# Patient Record
Sex: Male | Born: 1955 | Race: White | Hispanic: No | Marital: Single | Smoking: Current some day smoker
Health system: Southern US, Community
[De-identification: ages and names within clinical notes are randomized; demographics above are authoritative.]

## PROBLEM LIST (undated history)

## (undated) DIAGNOSIS — I10 Essential (primary) hypertension: Secondary | ICD-10-CM

## (undated) DIAGNOSIS — J449 Chronic obstructive pulmonary disease, unspecified: Secondary | ICD-10-CM

## (undated) DIAGNOSIS — C3490 Malignant neoplasm of unspecified part of unspecified bronchus or lung: Secondary | ICD-10-CM

## (undated) HISTORY — PX: KNEE SURGERY: SHX244

---

## 2005-08-12 ENCOUNTER — Emergency Department: Payer: Self-pay | Admitting: Emergency Medicine

## 2005-08-12 ENCOUNTER — Other Ambulatory Visit: Payer: Self-pay

## 2006-01-09 ENCOUNTER — Emergency Department (HOSPITAL_COMMUNITY): Admission: EM | Admit: 2006-01-09 | Discharge: 2006-01-09 | Payer: Self-pay | Admitting: Emergency Medicine

## 2009-09-07 ENCOUNTER — Inpatient Hospital Stay: Payer: Self-pay | Admitting: Psychiatry

## 2009-09-14 ENCOUNTER — Ambulatory Visit: Payer: Self-pay | Admitting: Unknown Physician Specialty

## 2009-09-27 ENCOUNTER — Ambulatory Visit: Payer: Self-pay | Admitting: Unknown Physician Specialty

## 2009-10-27 ENCOUNTER — Emergency Department: Payer: Self-pay | Admitting: Emergency Medicine

## 2010-04-23 ENCOUNTER — Inpatient Hospital Stay: Payer: Self-pay | Admitting: Unknown Physician Specialty

## 2012-12-30 ENCOUNTER — Emergency Department: Payer: Self-pay | Admitting: Emergency Medicine

## 2012-12-30 LAB — URINALYSIS, COMPLETE
Bacteria: NONE SEEN
Blood: NEGATIVE
Glucose,UR: 50 mg/dL (ref 0–75)
Nitrite: NEGATIVE
Protein: NEGATIVE
Specific Gravity: 1.019 (ref 1.003–1.030)
WBC UR: <1 /HPF (ref 0–5)

## 2012-12-30 LAB — CBC WITH DIFFERENTIAL/PLATELET
Eosinophil #: 0 10*3/uL (ref 0.0–0.7)
Eosinophil %: 0.6 %
HGB: 15.5 g/dL (ref 13.0–18.0)
Lymphocyte #: 1.9 10*3/uL (ref 1.0–3.6)
MCHC: 33.4 g/dL (ref 32.0–36.0)
MCV: 94 fL (ref 80–100)
Monocyte %: 15.8 %
Neutrophil #: 3.9 10*3/uL (ref 1.4–6.5)
Neutrophil %: 55.6 %
Platelet: 177 10*3/uL (ref 150–440)
RBC: 4.97 10*6/uL (ref 4.40–5.90)
RDW: 12.9 % (ref 11.5–14.5)
WBC: 7.1 10*3/uL (ref 3.8–10.6)

## 2012-12-30 LAB — COMPREHENSIVE METABOLIC PANEL
Albumin: 3.5 g/dL (ref 3.4–5.0)
Alkaline Phosphatase: 75 U/L (ref 50–136)
Anion Gap: 9 (ref 7–16)
Bilirubin,Total: 0.6 mg/dL (ref 0.2–1.0)
Chloride: 102 mmol/L (ref 98–107)
Co2: 25 mmol/L (ref 21–32)
Glucose: 94 mg/dL (ref 65–99)
Potassium: 3.8 mmol/L (ref 3.5–5.1)
SGOT(AST): 123 U/L — ABNORMAL HIGH (ref 15–37)
SGPT (ALT): 129 U/L — ABNORMAL HIGH (ref 12–78)

## 2013-03-17 ENCOUNTER — Encounter (HOSPITAL_COMMUNITY)
Admission: EM | Disposition: A | Discharge status: Home / Self Care | Payer: Self-pay | Source: Ambulatory Visit | Attending: Orthopedic Surgery

## 2013-03-17 ENCOUNTER — Inpatient Hospital Stay (HOSPITAL_COMMUNITY): Payer: Self-pay | Admitting: Anesthesiology

## 2013-03-17 ENCOUNTER — Inpatient Hospital Stay (HOSPITAL_COMMUNITY)
Admission: EM | Admit: 2013-03-17 | Discharge: 2013-03-19 | DRG: 572 | Disposition: A | Discharge status: Home / Self Care | Payer: MEDICAID | Source: Ambulatory Visit | Attending: Orthopedic Surgery | Admitting: Orthopedic Surgery

## 2013-03-17 ENCOUNTER — Emergency Department: Payer: Self-pay

## 2013-03-17 ENCOUNTER — Encounter (HOSPITAL_COMMUNITY): Payer: Self-pay | Admitting: Anesthesiology

## 2013-03-17 DIAGNOSIS — B1920 Unspecified viral hepatitis C without hepatic coma: Secondary | ICD-10-CM | POA: Diagnosis present

## 2013-03-17 DIAGNOSIS — L02519 Cutaneous abscess of unspecified hand: Principal | ICD-10-CM | POA: Diagnosis present

## 2013-03-17 DIAGNOSIS — IMO0001 Reserved for inherently not codable concepts without codable children: Secondary | ICD-10-CM | POA: Diagnosis present

## 2013-03-17 HISTORY — PX: I & D EXTREMITY: SHX5045

## 2013-03-17 LAB — CBC WITH DIFFERENTIAL/PLATELET
Eosinophil #: 0.1 10*3/uL (ref 0.0–0.7)
Eosinophil %: 1.6 %
HCT: 49.1 % (ref 40.0–52.0)
HGB: 17.7 g/dL (ref 13.0–18.0)
MCH: 32.7 pg (ref 26.0–34.0)
MCHC: 36.1 g/dL — ABNORMAL HIGH (ref 32.0–36.0)
Neutrophil %: 66 %
Platelet: 227 10*3/uL (ref 150–440)
RDW: 12.6 % (ref 11.5–14.5)

## 2013-03-17 LAB — COMPREHENSIVE METABOLIC PANEL
Alkaline Phosphatase: 73 U/L (ref 50–136)
Anion Gap: 5 — ABNORMAL LOW (ref 7–16)
BUN: 11 mg/dL (ref 7–18)
Bilirubin,Total: 0.6 mg/dL (ref 0.2–1.0)
Calcium, Total: 8.5 mg/dL (ref 8.5–10.1)
Chloride: 108 mmol/L — ABNORMAL HIGH (ref 98–107)
Co2: 28 mmol/L (ref 21–32)
Creatinine: 0.72 mg/dL (ref 0.60–1.30)
Potassium: 3.4 mmol/L — ABNORMAL LOW (ref 3.5–5.1)
SGOT(AST): 58 U/L — ABNORMAL HIGH (ref 15–37)

## 2013-03-17 SURGERY — IRRIGATION AND DEBRIDEMENT EXTREMITY
Anesthesia: General | Laterality: Right | Wound class: Dirty or Infected

## 2013-03-17 MED ORDER — PROPOFOL 10 MG/ML IV BOLUS
INTRAVENOUS | Status: DC | PRN
  Administered 2013-03-17: 120 mg via INTRAVENOUS

## 2013-03-17 MED ORDER — FENTANYL CITRATE 0.05 MG/ML IJ SOLN
INTRAMUSCULAR | Status: DC | PRN
  Administered 2013-03-17: 100 ug via INTRAVENOUS
  Administered 2013-03-17: 50 ug via INTRAVENOUS
  Administered 2013-03-17: 100 ug via INTRAVENOUS

## 2013-03-17 MED ORDER — SODIUM CHLORIDE 0.9 % IR SOLN
Status: DC | PRN
  Administered 2013-03-17: 3000 mL

## 2013-03-17 MED ORDER — MIDAZOLAM HCL 5 MG/5ML IJ SOLN
INTRAMUSCULAR | Status: DC | PRN
  Administered 2013-03-17: 2 mg via INTRAVENOUS

## 2013-03-17 MED ORDER — LIDOCAINE HCL (CARDIAC) 20 MG/ML IV SOLN
INTRAVENOUS | Status: DC | PRN
  Administered 2013-03-17: 100 mg via INTRAVENOUS

## 2013-03-17 MED ORDER — ONDANSETRON HCL 4 MG/2ML IJ SOLN
INTRAMUSCULAR | Status: DC | PRN
  Administered 2013-03-17: 4 mg via INTRAVENOUS

## 2013-03-17 MED ORDER — LACTATED RINGERS IV SOLN
INTRAVENOUS | Status: DC | PRN
  Administered 2013-03-17: 23:00:00 via INTRAVENOUS

## 2013-03-17 MED ORDER — SUCCINYLCHOLINE CHLORIDE 20 MG/ML IJ SOLN
INTRAMUSCULAR | Status: DC | PRN
  Administered 2013-03-17: 120 mg via INTRAVENOUS

## 2013-03-17 SURGICAL SUPPLY — 58 items
BANDAGE CONFORM 2  STR LF (GAUZE/BANDAGES/DRESSINGS) IMPLANT
BANDAGE ELASTIC 3 VELCRO ST LF (GAUZE/BANDAGES/DRESSINGS) ×2 IMPLANT
BANDAGE ELASTIC 4 VELCRO ST LF (GAUZE/BANDAGES/DRESSINGS) ×1 IMPLANT
BANDAGE GAUZE ELAST BULKY 4 IN (GAUZE/BANDAGES/DRESSINGS) ×1 IMPLANT
BNDG CMPR 9X4 STRL LF SNTH (GAUZE/BANDAGES/DRESSINGS)
BNDG COHESIVE 1X5 TAN STRL LF (GAUZE/BANDAGES/DRESSINGS) IMPLANT
BNDG ESMARK 4X9 LF (GAUZE/BANDAGES/DRESSINGS) ×1 IMPLANT
CLOTH BEACON ORANGE TIMEOUT ST (SAFETY) ×2 IMPLANT
CORDS BIPOLAR (ELECTRODE) ×2 IMPLANT
COVER SURGICAL LIGHT HANDLE (MISCELLANEOUS) ×2 IMPLANT
CUFF TOURNIQUET SINGLE 18IN (TOURNIQUET CUFF) ×2 IMPLANT
CUFF TOURNIQUET SINGLE 24IN (TOURNIQUET CUFF) IMPLANT
DRAIN PENROSE 1/4X12 LTX STRL (WOUND CARE) IMPLANT
DRAPE SURG 17X23 STRL (DRAPES) ×2 IMPLANT
DRSG ADAPTIC 3X8 NADH LF (GAUZE/BANDAGES/DRESSINGS) ×1 IMPLANT
ELECT REM PT RETURN 9FT ADLT (ELECTROSURGICAL)
ELECTRODE REM PT RTRN 9FT ADLT (ELECTROSURGICAL) IMPLANT
GAUZE XEROFORM 1X8 LF (GAUZE/BANDAGES/DRESSINGS) ×1 IMPLANT
GAUZE XEROFORM 5X9 LF (GAUZE/BANDAGES/DRESSINGS) IMPLANT
GLOVE BIOGEL PI IND STRL 8.5 (GLOVE) ×1 IMPLANT
GLOVE BIOGEL PI INDICATOR 8.5 (GLOVE) ×1
GLOVE SURG ORTHO 8.0 STRL STRW (GLOVE) ×2 IMPLANT
GOWN PREVENTION PLUS XLARGE (GOWN DISPOSABLE) ×3 IMPLANT
GOWN STRL NON-REIN LRG LVL3 (GOWN DISPOSABLE) ×3 IMPLANT
HANDPIECE INTERPULSE COAX TIP (DISPOSABLE) ×2
KIT BASIN OR (CUSTOM PROCEDURE TRAY) ×2 IMPLANT
KIT ROOM TURNOVER OR (KITS) ×2 IMPLANT
LOOP VESSEL MAXI BLUE (MISCELLANEOUS) ×1 IMPLANT
MANIFOLD NEPTUNE II (INSTRUMENTS) ×2 IMPLANT
NDL HYPO 25GX1X1/2 BEV (NEEDLE) IMPLANT
NEEDLE HYPO 25GX1X1/2 BEV (NEEDLE) IMPLANT
NS IRRIG 1000ML POUR BTL (IV SOLUTION) ×2 IMPLANT
PACK ORTHO EXTREMITY (CUSTOM PROCEDURE TRAY) ×2 IMPLANT
PAD ARMBOARD 7.5X6 YLW CONV (MISCELLANEOUS) ×4 IMPLANT
PAD CAST 4YDX4 CTTN HI CHSV (CAST SUPPLIES) ×1 IMPLANT
PADDING CAST ABS 4INX4YD NS (CAST SUPPLIES) ×1
PADDING CAST ABS COTTON 4X4 ST (CAST SUPPLIES) IMPLANT
PADDING CAST COTTON 4X4 STRL (CAST SUPPLIES)
SET CYSTO W/LG BORE CLAMP LF (SET/KITS/TRAYS/PACK) ×1 IMPLANT
SET HNDPC FAN SPRY TIP SCT (DISPOSABLE) IMPLANT
SOAP 2 % CHG 4 OZ (WOUND CARE) ×2 IMPLANT
SPONGE GAUZE 4X4 12PLY (GAUZE/BANDAGES/DRESSINGS) ×1 IMPLANT
SPONGE LAP 18X18 X RAY DECT (DISPOSABLE) ×1 IMPLANT
SPONGE LAP 4X18 X RAY DECT (DISPOSABLE) ×1 IMPLANT
SUCTION FRAZIER TIP 10 FR DISP (SUCTIONS) ×2 IMPLANT
SUT ETHIBOND 4 0 TF (SUTURE) ×1 IMPLANT
SUT ETHILON 4 0 PS 2 18 (SUTURE) IMPLANT
SUT ETHILON 5 0 P 3 18 (SUTURE) ×1
SUT NYLON ETHILON 5-0 P-3 1X18 (SUTURE) ×1 IMPLANT
SUT PROLENE 4 0 PS 2 18 (SUTURE) ×1 IMPLANT
SYR CONTROL 10ML LL (SYRINGE) IMPLANT
TOWEL OR 17X24 6PK STRL BLUE (TOWEL DISPOSABLE) ×2 IMPLANT
TOWEL OR 17X26 10 PK STRL BLUE (TOWEL DISPOSABLE) ×2 IMPLANT
TUBE ANAEROBIC SPECIMEN COL (MISCELLANEOUS) ×1 IMPLANT
TUBE CONNECTING 12X1/4 (SUCTIONS) ×2 IMPLANT
UNDERPAD 30X30 INCONTINENT (UNDERPADS AND DIAPERS) ×2 IMPLANT
WATER STERILE IRR 1000ML POUR (IV SOLUTION) ×2 IMPLANT
YANKAUER SUCT BULB TIP NO VENT (SUCTIONS) ×2 IMPLANT

## 2013-03-17 NOTE — Preoperative (Signed)
Beta Blockers   Reason not to administer Beta Blockers:Not Applicable 

## 2013-03-17 NOTE — Brief Op Note (Signed)
03/17/2013  11:52 PM  PATIENT:  Dennis Perkins  57 y.o. male  PRE-OPERATIVE DIAGNOSIS:  Hand Abscess  POST-OPERATIVE DIAGNOSIS:  Hand Abscess  PROCEDURE:  Procedure(s): IRRIGATION AND DEBRIDEMENT EXTREMITY (Right)  SURGEON:  Surgeon(s) and Role:    * Sharma Covert, MD - Primary  PHYSICIAN ASSISTANT:   ASSISTANTS: none   ANESTHESIA:   general  EBL:  Total I/O In: 750 [I.V.:750] Out: 15 [Blood:15]  BLOOD ADMINISTERED:none  DRAINS: none   LOCAL MEDICATIONS USED:  NONE  SPECIMEN:  No Specimen  DISPOSITION OF SPECIMEN:  N/A  COUNTS:  YES  TOURNIQUET:   Total Tourniquet Time Documented: Upper Arm (Right) - 20 minutes Total: Upper Arm (Right) - 20 minutes   DICTATION: .Other Dictation: Dictation Number [id number]  PLAN OF CARE: Admit to inpatient   PATIENT DISPOSITION:  PACU - hemodynamically stable.   Delay start of Pharmacological VTE agent (>24hrs) due to surgical blood loss or risk of bleeding: not applicable

## 2013-03-17 NOTE — H&P (Signed)
Dennis Perkins is an 57 y.o. male.   Chief Complaint: RIGHT HAND INFECTION HPI: PT TRANSFERRED FROM [place] WITH RIGHT HAND INFECTION PT SUSTAINED CAT SCRATCH DORSUM OF RIGHT HAND AND NOW AFTER SEVERAL DAYS HAS BEEN DRAINING PUS PT SEEN/EVALUATED AT [facility] AND TRANSFERRED HERE FOR DEFINITIVE CARE  HEP C +   No past surgical history on file. LEFT HAND SURGERY/RIGHT KNEE SURGERY  No family history on file. NO REACTION TO ANESTHESIA Social History:  has no tobacco, alcohol, and drug history on file.NONSMOKER, OCC ETOH H/O OF IV DRUG USE  Allergies: Allergies not on file  No prescriptions prior to admission    No results found for this or any previous visit (from the past 48 hour(s)). No results found.  NO RECENT ILLNESSES OR HOSPITALIZATIONS  There were no vitals taken for this visit. General Appearance:  Alert, cooperative, no distress, appears stated age  Head:  Normocephalic, without obvious abnormality, atraumatic  Eyes:  Pupils equal, conjunctiva/corneas clear,         Throat: Lips, mucosa, and tongue normal; teeth and gums normal  Neck: No visible masses     Lungs:   respirations unlabored  Chest Wall:  No tenderness or deformity  Heart:  Regular rate and rhythm,  Abdomen:   Soft, non-tender,         Extremities: RIGHT HAND: DRAINING OVER WOUND OVER DORSUM OF LONG FINGER. +PURULENCE FINGERS WARM WELL PERFUSED LIMITED DIGITAL MOTION   Pulses: 2+ and symmetric  Skin: Skin color, texture, turgor normal, no rashes or lesions     Neurologic: Normal    Assessment/Plan RIGHT HAND DORSAL ABSCESS CONCERN FOR MCP JOINT INFECTION FROM CAT BITE  RIGHT HAND INCISION AND DRAINAGE AND JOINT DEBRIDEMENT  R/B/A DISCUSSED WITH PT IN HOLDING AREA.  PT VOICED UNDERSTANDING OF PLAN CONSENT SIGNED DAY OF SURGERY PT SEEN AND EXAMINED PRIOR TO OPERATIVE PROCEDURE/DAY OF SURGERY SITE MARKED. QUESTIONS ANSWERED WILL REMAIN AN INPATIENT FOLLOWING SURGERY  Sharma Covert 03/17/2013, 10:31 PM

## 2013-03-17 NOTE — Anesthesia Procedure Notes (Signed)
Procedure Name: Intubation Date/Time: 03/17/2013 11:10 PM Performed by: Lugene Beougher S Pre-anesthesia Checklist: Patient identified, Timeout performed, Emergency Drugs available, Suction available and Patient being monitored Patient Re-evaluated:Patient Re-evaluated prior to inductionOxygen Delivery Method: Circle system utilized Preoxygenation: Pre-oxygenation with 100% oxygen Intubation Type: IV induction and Rapid sequence Ventilation: Mask ventilation without difficulty Laryngoscope Size: Mac and 4 Grade View: Grade I Tube type: Oral Tube size: 7.5 mm Number of attempts: 1 Airway Equipment and Method: Stylet Placement Confirmation: ETT inserted through vocal cords under direct vision,  positive ETCO2 and breath sounds checked- equal and bilateral Secured at: 22 cm Tube secured with: Tape Dental Injury: Teeth and Oropharynx as per pre-operative assessment

## 2013-03-17 NOTE — Anesthesia Preprocedure Evaluation (Signed)
Anesthesia Evaluation  Patient identified by MRN, date of birth, ID band Patient awake    Reviewed: Allergy & Precautions, H&P , NPO status , Patient's Chart, lab work & pertinent test results  Airway Mallampati: I TM Distance: >3 FB Neck ROM: Full    Dental   Pulmonary          Cardiovascular     Neuro/Psych    GI/Hepatic   Endo/Other    Renal/GU      Musculoskeletal   Abdominal   Peds  Hematology   Anesthesia Other Findings   Reproductive/Obstetrics                           Anesthesia Physical Anesthesia Plan  ASA: II and emergent  Anesthesia Plan: General   Post-op Pain Management:    Induction: Intravenous, Rapid sequence and Cricoid pressure planned  Airway Management Planned: Oral ETT  Additional Equipment:   Intra-op Plan:   Post-operative Plan: Extubation in OR  Informed Consent: I have reviewed the patients History and Physical, chart, labs and discussed the procedure including the risks, benefits and alternatives for the proposed anesthesia with the patient or authorized representative who has indicated his/her understanding and acceptance.     Plan Discussed with: CRNA and Surgeon  Anesthesia Plan Comments:         Anesthesia Quick Evaluation  

## 2013-03-18 ENCOUNTER — Encounter (HOSPITAL_COMMUNITY): Payer: Self-pay | Admitting: *Deleted

## 2013-03-18 MED ORDER — METHOCARBAMOL 500 MG PO TABS
ORAL_TABLET | ORAL | Status: AC
  Filled 2013-03-18: qty 1

## 2013-03-18 MED ORDER — HYDROMORPHONE HCL PF 1 MG/ML IJ SOLN
0.2500 mg | INTRAMUSCULAR | Status: DC | PRN
  Administered 2013-03-18 (×4): 0.5 mg via INTRAVENOUS

## 2013-03-18 MED ORDER — OXYCODONE HCL 5 MG PO TABS: 5.0000 mg | ORAL_TABLET | Freq: Once | ORAL | Status: DC | PRN

## 2013-03-18 MED ORDER — DIPHENHYDRAMINE HCL 25 MG PO CAPS: 25.0000 mg | ORAL_CAPSULE | Freq: Four times a day (QID) | ORAL | Status: DC | PRN

## 2013-03-18 MED ORDER — LABETALOL HCL 5 MG/ML IV SOLN
5.0000 mg | Freq: Once | INTRAVENOUS | Status: AC
  Administered 2013-03-18: 5 mg via INTRAVENOUS

## 2013-03-18 MED ORDER — HYDROMORPHONE HCL PF 1 MG/ML IJ SOLN
INTRAMUSCULAR | Status: AC
  Filled 2013-03-18: qty 1

## 2013-03-18 MED ORDER — MEPERIDINE HCL 25 MG/ML IJ SOLN: 6.2500 mg | INTRAMUSCULAR | Status: DC | PRN

## 2013-03-18 MED ORDER — HYDROMORPHONE HCL PF 1 MG/ML IJ SOLN
0.5000 mg | INTRAMUSCULAR | Status: DC | PRN
  Administered 2013-03-18 – 2013-03-19 (×5): 1 mg via INTRAVENOUS
  Filled 2013-03-18 (×5): qty 1

## 2013-03-18 MED ORDER — OXYCODONE-ACETAMINOPHEN 5-325 MG PO TABS
1.0000 | ORAL_TABLET | ORAL | Status: DC | PRN
  Administered 2013-03-18 (×2): 2 via ORAL
  Filled 2013-03-18 (×2): qty 2

## 2013-03-18 MED ORDER — METHOCARBAMOL 100 MG/ML IJ SOLN: 500.0000 mg | Freq: Four times a day (QID) | INTRAVENOUS | Status: DC | PRN

## 2013-03-18 MED ORDER — METHOCARBAMOL 500 MG PO TABS
500.0000 mg | ORAL_TABLET | Freq: Four times a day (QID) | ORAL | Status: DC | PRN
  Administered 2013-03-18: 500 mg via ORAL
  Filled 2013-03-18: qty 1

## 2013-03-18 MED ORDER — KCL IN DEXTROSE-NACL 40-5-0.45 MEQ/L-%-% IV SOLN
INTRAVENOUS | Status: DC
  Administered 2013-03-18: 02:00:00 via INTRAVENOUS
  Filled 2013-03-18 (×3): qty 1000

## 2013-03-18 MED ORDER — ALPRAZOLAM 0.5 MG PO TABS: 0.5000 mg | ORAL_TABLET | Freq: Four times a day (QID) | ORAL | Status: DC | PRN

## 2013-03-18 MED ORDER — LABETALOL HCL 5 MG/ML IV SOLN
INTRAVENOUS | Status: AC
  Filled 2013-03-18: qty 4

## 2013-03-18 MED ORDER — ONDANSETRON HCL 4 MG/2ML IJ SOLN: 4.0000 mg | Freq: Four times a day (QID) | INTRAMUSCULAR | Status: DC | PRN

## 2013-03-18 MED ORDER — VITAMIN C 500 MG PO TABS
1000.0000 mg | ORAL_TABLET | Freq: Every day | ORAL | Status: DC
  Administered 2013-03-18 – 2013-03-19 (×2): 1000 mg via ORAL
  Filled 2013-03-18 (×2): qty 2

## 2013-03-18 MED ORDER — OXYCODONE HCL 5 MG/5ML PO SOLN: 5.0000 mg | Freq: Once | ORAL | Status: DC | PRN

## 2013-03-18 MED ORDER — ADULT MULTIVITAMIN W/MINERALS CH
1.0000 | ORAL_TABLET | Freq: Every day | ORAL | Status: DC
  Administered 2013-03-18 – 2013-03-19 (×2): 1 via ORAL
  Filled 2013-03-18 (×2): qty 1

## 2013-03-18 MED ORDER — ONDANSETRON HCL 4 MG PO TABS: 4.0000 mg | ORAL_TABLET | Freq: Four times a day (QID) | ORAL | Status: DC | PRN

## 2013-03-18 MED ORDER — HYDROCODONE-ACETAMINOPHEN 5-325 MG PO TABS
1.0000 | ORAL_TABLET | ORAL | Status: DC | PRN
  Administered 2013-03-18 – 2013-03-19 (×4): 2 via ORAL
  Filled 2013-03-18 (×4): qty 2

## 2013-03-18 MED ORDER — ZOLPIDEM TARTRATE 5 MG PO TABS: 5.0000 mg | ORAL_TABLET | Freq: Every evening | ORAL | Status: DC | PRN

## 2013-03-18 MED ORDER — ONDANSETRON HCL 4 MG/2ML IJ SOLN: 4.0000 mg | Freq: Once | INTRAMUSCULAR | Status: DC | PRN

## 2013-03-18 MED ORDER — SODIUM CHLORIDE 0.9 % IV SOLN
3.0000 g | Freq: Four times a day (QID) | INTRAVENOUS | Status: DC
  Administered 2013-03-18 – 2013-03-19 (×6): 3 g via INTRAVENOUS
  Filled 2013-03-18 (×8): qty 3

## 2013-03-18 MED ORDER — DOCUSATE SODIUM 100 MG PO CAPS
100.0000 mg | ORAL_CAPSULE | Freq: Two times a day (BID) | ORAL | Status: DC
  Administered 2013-03-18 – 2013-03-19 (×3): 100 mg via ORAL
  Filled 2013-03-18 (×5): qty 1

## 2013-03-18 NOTE — Anesthesia Postprocedure Evaluation (Signed)
Anesthesia Post Note  Patient: Dennis Perkins  Procedure(s) Performed: Procedure(s) (LRB): IRRIGATION AND DEBRIDEMENT EXTREMITY, flexor tendon repair (Right)  Anesthesia type: general  Patient location: PACU  Post pain: Pain level controlled  Post assessment: Patient's Cardiovascular Status Stable  Last Vitals:  Filed Vitals:   03/18/13 0015  BP: 156/116  Pulse: 101  Temp:   Resp: 15    Post vital signs: Reviewed and stable  Level of consciousness: sedated  Complications: No apparent anesthesia complications

## 2013-03-18 NOTE — Progress Notes (Signed)
KACE HARTJE [id number] Code Status: full   Admission Data: 03/18/2013 2:16 AM Attending Provider:  Melvyn Novas  WUJ:[id number], Provider, MD Consults/ Treatment Team:    Dennis Perkins is a 57 y.o. male patient admitted from ED awake, alert - oriented  X 3 - no acute distress noted.  VSS - Blood pressure 165/113, pulse 90, temperature 98.4 F (36.9 C), temperature source Oral, resp. rate 16, height 5\' 9"  (1.753 m), weight 65.5 kg (144 lb 6.4 oz), SpO2 94.00%.  no c/o shortness of breath, no c/o chest pain.   IV Fluids:  IV in place, occlusive dsg intact without redness, IV cath hand left, condition patent and no redness D5W/0.45 NaCl.  Allergies:  No Known Allergies   History reviewed. No pertinent past medical history. No prescriptions prior to admission   History:  obtained from the patient. Tobacco/alcohol: denied social drinker  Orientation to room, and floor completed with information packet given to patient/family.  Patient declined safety video at this time.  Admission INP armband ID verified with patient/family, and in place.   SR up x 2, fall assessment complete, with patient and family able to verbalize understanding of risk associated with falls, and verbalized understanding to call nsg before up out of bed.  Call light within reach, patient able to voice, and demonstrate understanding.  Skin, clean-dry- intact without evidence of bruising, or skin tears.   No evidence of skin break down noted on exam.     Will cont to eval and treat per MD orders.  Orvan Seen, RN 03/18/2013 2:16 AM

## 2013-03-18 NOTE — Op Note (Signed)
NAMEMCGREGOR, TINNON NO.:  [id number]  MEDICAL RECORD NO.:  [id number]  LOCATION:  5532                         FACILITY:  [facility]  PHYSICIAN:  Madelynn Done, MD  DATE OF BIRTH:  1956-04-05  DATE OF PROCEDURE: DATE OF DISCHARGE:                              OPERATIVE REPORT   PREOPERATIVE DIAGNOSES:  Right hand abscess, dorsal metacarpophalangeal joint.  POSTOPERATIVE DIAGNOSIS:  Right hand abscess, dorsal metacarpophalangeal joint.  ATTENDING PHYSICIAN:  Madelynn Done, MD, who scrubbed and present for the entire procedure.  ASSISTANT SURGEON:  None.  ANESTHESIA:  General via LMA.  SURGICAL PROCEDURE: 1. Right long finger metacarpophalangeal joint arthrotomy and     drainage. 2. Right long finger extensor tendon repair 3. Right long finger excisional debridement of skin, subcutaneous     tissue, and tendon.  SURGICAL INDICATIONS:  Mr. Cranshaw is a 57 year old gentleman who sustained a cat scratch several days ago.  The patient presented to the [facility] Emergency Department with worsening swelling and pain to the hand. The patient is transferred to [facility] for definitive care.  Risks, benefits, and alternatives were discussed in detail with the patient and signed informed consent was obtained.  Risks include, but not limited to bleeding, infection, damage to nearby nerves, arteries, or tendons, loss of motion of wrist and digits, incomplete relief of symptoms, tendon rupture, and need for further surgical intervention.  DESCRIPTION OF PROCEDURE:  The patient was appropriately identified, brought to the preop holding area, marked with a permanent marker made on the right hand to indicate the correct operative site.  The patient was brought back to the operating room, placed supine on the anesthesia room table.  General anesthesia was administered.  The patient tolerated this well.  Well-padded tourniquet was then placed on the right brachium and  sealed with 1000-drape.  Right upper extremity was then prepped and draped in normal sterile fashion.  Time-out was called, correct side was identified and the procedure was then begun.  Attention was then turned to the right hand where long incision sites were made directly over the MCP joint, long finger.  Dissection was then carried down through the skin, subcutaneous tissue, and tourniquet insufflated.  Excisional debridement of skin, subcutaneous tissue, and a portion extensor tendon was then carried out.  The patient did have a mild amount of purulence and it was extended all the way into the joint capsule.  Completion of the joint arthrotomy was then carried out.  The joint was then thoroughly irrigated.  The devitalized tissue and infected tendon was then also debrided back.  The patient did have infection going all the way through the extensor tendon.  This was carefully elevated off the MCP joint capsule, both proximally and distally removing the devitalized necrotic looking tendon.  The wound was then thoroughly irrigated.  The joint was then irrigated with 3 L of saline.  Following the joint arthrotomy and excisional debridement, the extensor tendon was then repaired with 4-0 Ethibond figure-of-eight horizontal mattress sutures back to healthy tendon.  Copious wound irrigation done throughout.  The wound was then loosely closed and reapproximated with small blue vessel loops with Prolene suture.  Adaptic dressing, sterile compressive bandage then applied.  The patient tolerated the procedure well and returned to recovery room in good condition after being placed in a volar splint.  POSTPROCEDURE PLAN:  The patient was admitted overnight for IV antibiotics, pain control, discharge.  Wound check within 48 hours, and drain removal and likely discharge in the splint, splint immobilization likely for the first 4 weeks, to protect the tendon repair, oral antibiotics.  Continue to  follow him closely.     Madelynn Done, MD     FWO/MEDQ  D:  03/17/2013  T:  03/18/2013  Job:  [id number]

## 2013-03-18 NOTE — Transfer of Care (Signed)
Immediate Anesthesia Transfer of Care Note  Patient: Dennis Perkins  Procedure(s) Performed: Procedure(s): IRRIGATION AND DEBRIDEMENT EXTREMITY, flexor tendon repair (Right)  Patient Location: PACU  Anesthesia Type:General  Level of Consciousness: awake, alert  and oriented  Airway & Oxygen Therapy: Patient Spontanous Breathing and Patient connected to nasal cannula oxygen  Post-op Assessment: Report given to PACU RN and Post -op Vital signs reviewed and stable  Post vital signs: Reviewed and stable  Complications: No apparent anesthesia complications

## 2013-03-18 NOTE — Progress Notes (Signed)
UR COMPLETED  

## 2013-03-18 NOTE — Progress Notes (Signed)
Pt.s BP remains 150s/100s, Dr. Michelle Piper aware and states give labetalol and ok to take to room

## 2013-03-18 NOTE — Care Management Note (Signed)
    Page 1 of 1   03/19/2013     11:08:58 AM   CARE MANAGEMENT NOTE 03/19/2013  Patient:  Dennis Perkins, Dennis Perkins   Account Number:  [id number]  Date Initiated:  03/18/2013  Documentation initiated by:  Letha Cape  Subjective/Objective Assessment:   dx right hand cat scratch  admit- lives with family     Action/Plan:   Anticipated DC Date:  03/19/2013   Anticipated DC Plan:  HOME/SELF CARE      DC Planning Services  CM consult      Choice offered to / List presented to:             Status of service:  Completed, signed off Medicare Important Message given?   (If response is "NO", the following Medicare IM given date fields will be blank) Date Medicare IM given:   Date Additional Medicare IM given:    Discharge Disposition:  HOME/SELF CARE  Per UR Regulation:  Reviewed for med. necessity/level of care/duration of stay  If discussed at Long Length of Stay Meetings, dates discussed:    Comments:  03/18/13 8:29 Letha Cape RN, BSN [phone] patient is indep. He can afford his medications and has transportation.  No needs anticipated.

## 2013-03-19 MED ORDER — OXYCODONE-ACETAMINOPHEN 10-325 MG PO TABS: 1.0000 | ORAL_TABLET | ORAL | Status: DC | PRN

## 2013-03-19 MED ORDER — DOCUSATE SODIUM 100 MG PO CAPS: 100.0000 mg | ORAL_CAPSULE | Freq: Two times a day (BID) | ORAL | Status: DC

## 2013-03-19 MED ORDER — AMOXICILLIN-POT CLAVULANATE 875-125 MG PO TABS: 1.0000 | ORAL_TABLET | Freq: Two times a day (BID) | ORAL | Status: DC

## 2013-03-19 NOTE — Discharge Summary (Signed)
Physician Discharge Summary  Patient ID: Dennis Perkins MRN: [id number] DOB/AGE: Jun 29, 1956 57 y.o.  Admit date: 03/17/2013 Discharge date: 03/19/2013  Admission Diagnoses: Hand Abscess History reviewed. No pertinent past medical history.  Discharge Diagnoses:  sAME AS ADMISSION  Surgeries: Procedure(s): IRRIGATION AND DEBRIDEMENT EXTREMITY, flexor tendon repair on 03/17/2013 - 03/18/2013    Consultants:  NONE  Discharged Condition: Improved  Hospital Course: [name] is an 57 y.o. male who was admitted 03/17/2013 with a chief complaint of No chief complaint on file. , and found to have a diagnosis of Hand Abscess.  They were brought to the operating room on 03/17/2013 - 03/18/2013 and underwent Procedure(s): IRRIGATION AND DEBRIDEMENT EXTREMITY, flexor tendon repair.    They were given perioperative antibiotics: Anti-infectives   Start     Dose/Rate Route Frequency Ordered Stop   03/19/13 0000  amoxicillin-clavulanate (AUGMENTIN) 875-125 MG per tablet     1 tablet Oral 2 times daily 03/19/13 0745     03/18/13 0200  Ampicillin-Sulbactam (UNASYN) 3 g in sodium chloride 0.9 % 100 mL IVPB     3 g 100 mL/hr over 60 Minutes Intravenous Every 6 hours 03/18/13 0141      .  They were given sequential compression devices, early ambulation, and Other (comment) for DVT prophylaxis.  Recent vital signs: Patient Vitals for the past 24 hrs:  BP Temp Temp src Pulse Resp SpO2  03/19/13 0456 148/91 mmHg 98.2 F (36.8 C) Oral 61 16 94 %  03/19/13 0429 125/82 mmHg 98.4 F (36.9 C) Oral 95 16 98 %  03/18/13 2019 137/93 mmHg 98.2 F (36.8 C) Oral 67 16 96 %  03/18/13 1446 134/97 mmHg 98.2 F (36.8 C) Oral 69 18 96 %  03/18/13 1300 145/98 mmHg - - 68 - -  .  Recent laboratory studies: No results found.  Discharge Medications:     Medication List    STOP taking these medications       naproxen sodium 220 MG tablet  Commonly known as:  ANAPROX      TAKE these medications        amoxicillin-clavulanate 875-125 MG per tablet  Commonly known as:  AUGMENTIN  Take 1 tablet by mouth 2 (two) times daily.     docusate sodium 100 MG capsule  Commonly known as:  COLACE  Take 1 capsule (100 mg total) by mouth 2 (two) times daily.     oxyCODONE-acetaminophen 10-325 MG per tablet  Commonly known as:  PERCOCET  Take 1 tablet by mouth every 4 (four) hours as needed for pain.        Diagnostic Studies: No results found.  They benefited maximally from their hospital stay and there were no complications.     Disposition: Final discharge disposition not confirmed      Follow-up Information   Schedule an appointment as soon as possible for a visit with Sharma Covert, MD.   Contact information:   [address] [place] [address] [place] [address] [phone]     PT SEEN/EXAMINED ON DAY OF DISCHARGE READY TO [facility] HOME FINGER LOOKS BETTER   Signed: Sharma Covert 03/19/2013, 7:47 AM

## 2013-03-19 NOTE — Progress Notes (Signed)
Dennis Perkins to be D/C'd Home per MD order.  Discharge instructions reviewed and discussed with the patient, all questions and concerns answered. Copy of instructions and scripts given to patient.  Patients skin is clean, dry and intact no evidence of skin break down. IV site discontinued and catheter remains intact. Site without signs and symptoms of complications. Dressing and pressure applied.  Patient escorted to car by NT in a wheelchair,  no distress noted upon discharge.  Bing Quarry 03/19/2013 12:19 PM

## 2013-03-21 LAB — CULTURE, ROUTINE-ABSCESS

## 2013-03-22 LAB — ANAEROBIC CULTURE: Gram Stain: NONE SEEN

## 2013-03-23 LAB — CULTURE, BLOOD (SINGLE)

## 2013-12-10 ENCOUNTER — Emergency Department: Payer: Self-pay | Admitting: Emergency Medicine

## 2013-12-10 LAB — URINALYSIS, COMPLETE
BILIRUBIN, UR: NEGATIVE
BLOOD: NEGATIVE
Bacteria: NONE SEEN
Glucose,UR: NEGATIVE mg/dL (ref 0–75)
KETONE: NEGATIVE
Leukocyte Esterase: NEGATIVE
NITRITE: NEGATIVE
PROTEIN: NEGATIVE
Ph: 5 (ref 4.5–8.0)
Specific Gravity: 1.006 (ref 1.003–1.030)

## 2013-12-10 LAB — DRUG SCREEN, URINE
AMPHETAMINES, UR SCREEN: NEGATIVE (ref ?–1000)
BENZODIAZEPINE, UR SCRN: NEGATIVE (ref ?–200)
Barbiturates, Ur Screen: NEGATIVE (ref ?–200)
CANNABINOID 50 NG, UR ~~LOC~~: NEGATIVE (ref ?–50)
COCAINE METABOLITE, UR ~~LOC~~: POSITIVE (ref ?–300)
MDMA (Ecstasy)Ur Screen: NEGATIVE (ref ?–500)
Methadone, Ur Screen: NEGATIVE (ref ?–300)
Opiate, Ur Screen: NEGATIVE (ref ?–300)
Phencyclidine (PCP) Ur S: NEGATIVE (ref ?–25)
Tricyclic, Ur Screen: NEGATIVE (ref ?–1000)

## 2013-12-10 LAB — CBC
HCT: 50 % (ref 40.0–52.0)
HGB: 17.5 g/dL (ref 13.0–18.0)
MCH: 33.6 pg (ref 26.0–34.0)
MCHC: 35 g/dL (ref 32.0–36.0)
MCV: 96 fL (ref 80–100)
PLATELETS: 189 10*3/uL (ref 150–440)
RBC: 5.2 10*6/uL (ref 4.40–5.90)
RDW: 12.8 % (ref 11.5–14.5)
WBC: 8.6 10*3/uL (ref 3.8–10.6)

## 2013-12-10 LAB — COMPREHENSIVE METABOLIC PANEL
ALBUMIN: 3.7 g/dL (ref 3.4–5.0)
ALK PHOS: 74 U/L
AST: 174 U/L — AB (ref 15–37)
Anion Gap: 8 (ref 7–16)
BUN: 7 mg/dL (ref 7–18)
Bilirubin,Total: 0.5 mg/dL (ref 0.2–1.0)
CO2: 25 mmol/L (ref 21–32)
CREATININE: 0.95 mg/dL (ref 0.60–1.30)
Calcium, Total: 8.1 mg/dL — ABNORMAL LOW (ref 8.5–10.1)
Chloride: 110 mmol/L — ABNORMAL HIGH (ref 98–107)
EGFR (African American): >60
EGFR (Non-African Amer.): >60
Glucose: 93 mg/dL (ref 65–99)
Osmolality: 283 (ref 275–301)
Potassium: 4 mmol/L (ref 3.5–5.1)
SGPT (ALT): 173 U/L — ABNORMAL HIGH (ref 12–78)
Sodium: 143 mmol/L (ref 136–145)
TOTAL PROTEIN: 8 g/dL (ref 6.4–8.2)

## 2013-12-10 LAB — LIPASE, BLOOD: LIPASE: 573 U/L — AB (ref 73–393)

## 2013-12-10 LAB — PROTIME-INR
INR: 1
Prothrombin Time: 13.4 secs (ref 11.5–14.7)

## 2013-12-10 LAB — TROPONIN I

## 2013-12-11 LAB — TROPONIN I: Troponin-I: <0.02 ng/mL

## 2013-12-15 ENCOUNTER — Emergency Department: Payer: Self-pay | Admitting: Emergency Medicine

## 2013-12-15 LAB — CBC
HCT: 45.5 % (ref 40.0–52.0)
HGB: 15.8 g/dL (ref 13.0–18.0)
MCH: 33.3 pg (ref 26.0–34.0)
MCHC: 34.7 g/dL (ref 32.0–36.0)
MCV: 96 fL (ref 80–100)
Platelet: 170 10*3/uL (ref 150–440)
RBC: 4.75 10*6/uL (ref 4.40–5.90)
RDW: 13.3 % (ref 11.5–14.5)
WBC: 7.6 10*3/uL (ref 3.8–10.6)

## 2013-12-15 LAB — TROPONIN I: Troponin-I: <0.02 ng/mL

## 2013-12-15 LAB — BASIC METABOLIC PANEL
Anion Gap: 9 (ref 7–16)
BUN: 5 mg/dL — ABNORMAL LOW (ref 7–18)
CALCIUM: 8.6 mg/dL (ref 8.5–10.1)
Chloride: 110 mmol/L — ABNORMAL HIGH (ref 98–107)
Co2: 24 mmol/L (ref 21–32)
Creatinine: 0.72 mg/dL (ref 0.60–1.30)
EGFR (Non-African Amer.): >60
Glucose: 92 mg/dL (ref 65–99)
OSMOLALITY: 282 (ref 275–301)
Potassium: 3.8 mmol/L (ref 3.5–5.1)
SODIUM: 143 mmol/L (ref 136–145)

## 2013-12-15 LAB — PROTIME-INR
INR: 0.9
Prothrombin Time: 12.4 secs (ref 11.5–14.7)

## 2013-12-15 LAB — PRO B NATRIURETIC PEPTIDE: B-TYPE NATIURETIC PEPTID: 61 pg/mL (ref 0–125)

## 2013-12-16 LAB — TROPONIN I: Troponin-I: <0.02 ng/mL

## 2013-12-30 ENCOUNTER — Emergency Department: Payer: Self-pay | Admitting: Emergency Medicine

## 2013-12-30 LAB — CBC
HCT: 49.1 % (ref 40.0–52.0)
HGB: 17.4 g/dL (ref 13.0–18.0)
MCH: 33.7 pg (ref 26.0–34.0)
MCHC: 35.5 g/dL (ref 32.0–36.0)
MCV: 95 fL (ref 80–100)
PLATELETS: 152 10*3/uL (ref 150–440)
RBC: 5.18 10*6/uL (ref 4.40–5.90)
RDW: 13 % (ref 11.5–14.5)
WBC: 7.4 10*3/uL (ref 3.8–10.6)

## 2013-12-30 LAB — TROPONIN I: Troponin-I: <0.02 ng/mL

## 2013-12-30 LAB — BASIC METABOLIC PANEL
Anion Gap: 9 (ref 7–16)
BUN: 6 mg/dL — ABNORMAL LOW (ref 7–18)
CREATININE: 0.7 mg/dL (ref 0.60–1.30)
Calcium, Total: 9.1 mg/dL (ref 8.5–10.1)
Chloride: 109 mmol/L — ABNORMAL HIGH (ref 98–107)
Co2: 23 mmol/L (ref 21–32)
EGFR (African American): >60
Glucose: 101 mg/dL — ABNORMAL HIGH (ref 65–99)
Osmolality: 279 (ref 275–301)
Potassium: 3.8 mmol/L (ref 3.5–5.1)
Sodium: 141 mmol/L (ref 136–145)

## 2013-12-30 LAB — DRUG SCREEN, URINE

## 2013-12-30 LAB — ETHANOL
ETHANOL %: 0.347 % — AB (ref 0.000–0.080)
ETHANOL LVL: 347 mg/dL — AB

## 2013-12-30 LAB — PRO B NATRIURETIC PEPTIDE: B-TYPE NATIURETIC PEPTID: 142 pg/mL — AB (ref 0–125)

## 2013-12-30 LAB — PROTIME-INR
INR: 0.9
Prothrombin Time: 12.2 secs (ref 11.5–14.7)

## 2015-02-05 ENCOUNTER — Emergency Department: Admit: 2015-02-05 | Disposition: A | Discharge status: Home / Self Care | Payer: Self-pay | Admitting: Internal Medicine

## 2015-02-05 LAB — DRUG SCREEN, URINE
AMPHETAMINES, UR SCREEN: NEGATIVE
BENZODIAZEPINE, UR SCRN: NEGATIVE
Barbiturates, Ur Screen: NEGATIVE
COCAINE METABOLITE, UR ~~LOC~~: NEGATIVE
Cannabinoid 50 Ng, Ur ~~LOC~~: NEGATIVE
MDMA (ECSTASY) UR SCREEN: NEGATIVE
Methadone, Ur Screen: NEGATIVE
Opiate, Ur Screen: NEGATIVE
PHENCYCLIDINE (PCP) UR S: NEGATIVE
Tricyclic, Ur Screen: NEGATIVE

## 2015-02-05 LAB — URINALYSIS, COMPLETE
BILIRUBIN, UR: NEGATIVE
Bacteria: NONE SEEN
Glucose,UR: NEGATIVE mg/dL (ref 0–75)
Ketone: NEGATIVE
Leukocyte Esterase: NEGATIVE
Nitrite: NEGATIVE
PH: 5 (ref 4.5–8.0)
Protein: NEGATIVE
RBC,UR: NONE SEEN /HPF (ref 0–5)
SPECIFIC GRAVITY: 1.003 (ref 1.003–1.030)
SQUAMOUS EPITHELIAL: NONE SEEN

## 2015-02-05 LAB — COMPREHENSIVE METABOLIC PANEL
ALBUMIN: 4.1 g/dL
ALK PHOS: 73 U/L
ALT: 186 U/L — AB
AST: 178 U/L — AB
Anion Gap: 11 (ref 7–16)
BUN: 7 mg/dL
Bilirubin,Total: 0.8 mg/dL
CHLORIDE: 107 mmol/L
Calcium, Total: 8.6 mg/dL — ABNORMAL LOW
Co2: 23 mmol/L
Creatinine: 0.78 mg/dL
EGFR (African American): >60
EGFR (Non-African Amer.): >60
GLUCOSE: 140 mg/dL — AB
POTASSIUM: 3.7 mmol/L
SODIUM: 141 mmol/L
Total Protein: 7.9 g/dL

## 2015-02-05 LAB — CBC
HCT: 49 % (ref 40.0–52.0)
HGB: 17.3 g/dL (ref 13.0–18.0)
MCH: 33.9 pg (ref 26.0–34.0)
MCHC: 35.4 g/dL (ref 32.0–36.0)
MCV: 96 fL (ref 80–100)
PLATELETS: 162 10*3/uL (ref 150–440)
RBC: 5.11 10*6/uL (ref 4.40–5.90)
RDW: 13.3 % (ref 11.5–14.5)
WBC: 6.5 10*3/uL (ref 3.8–10.6)

## 2015-02-05 LAB — ETHANOL: Ethanol: 448 mg/dL

## 2015-02-05 LAB — TROPONIN I: Troponin-I: <0.03 ng/mL

## 2015-02-17 NOTE — Consult Note (Signed)
Brief Consult Note: Diagnosis: Fall: Left cartilaginous 8th, 9th or 10th rib Fx.   Patient was seen by consultant.   Discussed with Attending MD.   Comments: Advised pt to be admitted for pain management and consult with Dr Marta Lamas in AM but he refused. No risk of lung injury or intra-abdominal injury. D/W ED MD who plans to give the Pt Rx for narcotics. Advised not to climb, operate heavy equiptment, or drive on narcotics. F/U w/ Dr Genevive Bi as outpt next week.  Electronic Signatures: Consuela Mimes (MD)  (Signed [phone] 20:36)  Authored: Brief Consult Note   Last Updated: 05-Mar-14 20:36 by Consuela Mimes (MD)

## 2015-02-26 ENCOUNTER — Emergency Department: Payer: Self-pay

## 2015-02-26 ENCOUNTER — Other Ambulatory Visit: Payer: Self-pay

## 2015-02-26 ENCOUNTER — Encounter: Payer: Self-pay | Admitting: Emergency Medicine

## 2015-02-26 ENCOUNTER — Emergency Department
Admission: EM | Admit: 2015-02-26 | Discharge: 2015-02-27 | Disposition: A | Discharge status: Home / Self Care | Payer: Self-pay | Attending: Emergency Medicine | Admitting: Emergency Medicine

## 2015-02-26 DIAGNOSIS — R55 Syncope and collapse: Secondary | ICD-10-CM

## 2015-02-26 DIAGNOSIS — Z792 Long term (current) use of antibiotics: Secondary | ICD-10-CM | POA: Insufficient documentation

## 2015-02-26 DIAGNOSIS — F10129 Alcohol abuse with intoxication, unspecified: Secondary | ICD-10-CM | POA: Insufficient documentation

## 2015-02-26 DIAGNOSIS — Z720 Tobacco use: Secondary | ICD-10-CM | POA: Insufficient documentation

## 2015-02-26 DIAGNOSIS — Z79899 Other long term (current) drug therapy: Secondary | ICD-10-CM | POA: Insufficient documentation

## 2015-02-26 DIAGNOSIS — F10920 Alcohol use, unspecified with intoxication, uncomplicated: Secondary | ICD-10-CM

## 2015-02-26 DIAGNOSIS — F13129 Sedative, hypnotic or anxiolytic abuse with intoxication, unspecified: Secondary | ICD-10-CM | POA: Insufficient documentation

## 2015-02-26 LAB — CBC
HCT: 47.3 % (ref 40.0–52.0)
Hemoglobin: 16.5 g/dL (ref 13.0–18.0)
MCH: 34 pg (ref 26.0–34.0)
MCHC: 34.9 g/dL (ref 32.0–36.0)
MCV: 97.5 fL (ref 80.0–100.0)
PLATELETS: 137 10*3/uL — AB (ref 150–440)
RBC: 4.85 MIL/uL (ref 4.40–5.90)
RDW: 13.1 % (ref 11.5–14.5)
WBC: 4 10*3/uL (ref 3.8–10.6)

## 2015-02-26 LAB — BASIC METABOLIC PANEL
ANION GAP: 11 (ref 5–15)
BUN: 5 mg/dL — ABNORMAL LOW (ref 6–20)
CALCIUM: 8.2 mg/dL — AB (ref 8.9–10.3)
CO2: 24 mmol/L (ref 22–32)
Chloride: 109 mmol/L (ref 101–111)
Creatinine, Ser: 0.87 mg/dL (ref 0.61–1.24)
GFR calc Af Amer: >60 mL/min (ref >60)
GLUCOSE: 98 mg/dL (ref 65–99)
POTASSIUM: 3.7 mmol/L (ref 3.5–5.1)
Sodium: 144 mmol/L (ref 135–145)

## 2015-02-26 LAB — ETHANOL: Alcohol, Ethyl (B): 409 mg/dL (ref <5)

## 2015-02-26 LAB — TROPONIN I: Troponin I: <0.03 ng/mL (ref <0.031)

## 2015-02-26 MED ORDER — SODIUM CHLORIDE 0.9 % IV BOLUS (SEPSIS)
1000.0000 mL | Freq: Once | INTRAVENOUS | Status: AC
  Administered 2015-02-26: 1000 mL via INTRAVENOUS

## 2015-02-26 NOTE — ED Notes (Signed)
Critical etoh called from lab of 409. Dr. Edd Fabian notified.

## 2015-02-26 NOTE — ED Notes (Signed)
Pt continues to sleep, awakes easily to verbal stimuli, perrl 22m brisk. Vss.

## 2015-02-26 NOTE — ED Notes (Signed)
Pt sleeping, awakes easily to verbal stimuli. Pt declines offer to urinate at this time. Extra warm blankets provided. vss.

## 2015-02-26 NOTE — Discharge Instructions (Signed)
Alcohol Intoxication °Alcohol intoxication occurs when you drink enough alcohol that it affects your ability to function. It can be mild or very severe. Drinking a lot of alcohol in a short time is called binge drinking. This can be very harmful. Drinking alcohol can also be more dangerous if you are taking medicines or other drugs. Some of the effects caused by alcohol may include: °· Loss of coordination. °· Changes in mood and behavior. °· Unclear thinking. °· Trouble talking (slurred speech). °· Throwing up (vomiting). °· Confusion. °· Slowed breathing. °· Twitching and shaking (seizures). °· Loss of consciousness. °HOME CARE °· Do not drive after drinking alcohol. °· Drink enough water and fluids to keep your pee (urine) clear or pale yellow. Avoid caffeine. °· Only take medicine as told by your doctor. °GET HELP IF: °· You throw up (vomit) many times. °· You do not feel better after a few days. °· You frequently have alcohol intoxication. Your doctor can help decide if you should see a substance use treatment counselor. °GET HELP RIGHT AWAY IF: °· You become shaky when you stop drinking. °· You have twitching and shaking. °· You throw up blood. It may look bright red or like coffee grounds. °· You notice blood in your poop (bowel movements). °· You become lightheaded or pass out (faint). °MAKE SURE YOU:  °· Understand these instructions. °· Will watch your condition. °· Will get help right away if you are not doing well or get worse. °Document Released: 04/01/2008 Document Revised: 06/16/2013 Document Reviewed: 03/19/2013 °[employer]® Patient Information ©2015 [employer]. This information is not intended to replace advice given to you by your health care provider. Make sure you discuss any questions you have with your health care provider. ° °

## 2015-02-26 NOTE — ED Notes (Signed)
Pt presents to ED via [facility] from home. Per pt, he was walking to mailbox and states, "the next thing I knew I was on the ground." Pt reports drinking four beers today. Pt is A&O x 4 during triage.
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
## 2015-02-26 NOTE — ED Notes (Signed)
Pt continues to sleep, resps unlabored. Vss.

## 2015-02-26 NOTE — ED Notes (Signed)
Pt sleeping, resps unalbored. Vss.

## 2015-02-27 LAB — CK: Total CK: 116 U/L (ref 49–397)

## 2015-02-27 LAB — URINE DRUG SCREEN, QUALITATIVE (ARMC ONLY)
Amphetamines, Ur Screen: NOT DETECTED
BARBITURATES, UR SCREEN: NOT DETECTED
Benzodiazepine, Ur Scrn: NOT DETECTED
COCAINE METABOLITE, UR ~~LOC~~: NOT DETECTED
Cannabinoid 50 Ng, Ur ~~LOC~~: NOT DETECTED
MDMA (Ecstasy)Ur Screen: NOT DETECTED
Methadone Scn, Ur: NOT DETECTED
Opiate, Ur Screen: NOT DETECTED
Phencyclidine (PCP) Ur S: NOT DETECTED
TRICYCLIC, UR SCREEN: NOT DETECTED

## 2015-02-27 LAB — URINALYSIS COMPLETE WITH MICROSCOPIC (ARMC ONLY)
BACTERIA UA: NONE SEEN
BILIRUBIN URINE: NEGATIVE
Glucose, UA: NEGATIVE mg/dL
Ketones, ur: NEGATIVE mg/dL
LEUKOCYTES UA: NEGATIVE
Nitrite: NEGATIVE
PROTEIN: NEGATIVE mg/dL
RBC / HPF: NONE SEEN RBC/hpf (ref 0–5)
Specific Gravity, Urine: 1.01 (ref 1.005–1.030)
pH: 5 (ref 5.0–8.0)

## 2015-02-27 MED ORDER — LORAZEPAM 2 MG PO TABS
ORAL_TABLET | ORAL | Status: AC
  Administered 2015-02-27: 02:00:00 via ORAL
  Filled 2015-02-27: qty 1

## 2015-02-27 MED ORDER — TRAMADOL HCL 50 MG PO TABS: 50.0000 mg | ORAL_TABLET | Freq: Once | ORAL | Status: DC

## 2015-02-27 MED ORDER — IPRATROPIUM-ALBUTEROL 0.5-2.5 (3) MG/3ML IN SOLN
3.0000 mL | Freq: Once | RESPIRATORY_TRACT | Status: DC
Start: 2015-02-27 — End: 2015-02-27

## 2015-02-27 NOTE — ED Provider Notes (Signed)
-----------------------------------------   12:01 AM on 02/27/2015 -----------------------------------------  Assuming care from Dr. Edd Fabian.  In short, Dennis Perkins is a 59 y.o. male with a chief complaint of Loss of Consciousness and Alcohol Intoxication .  Refer to the original H&P for additional details.  Patient here with syncope, currently intoxicated with alcohol. Workup is largely within normal limits. We'll continue to monitor in the emergency department until clinically sober and appropriate for discharge. ----------------------------------------- 7:16 AM on 02/27/2015 -----------------------------------------  Patient resting comfortably, awakens appropriately. Patient clinically sober at this time. Patient will be calling a ride to take him home. I discussed the patient's workup with him, and the need to follow up with a primary care physician. The patient is agreeable to this plan.  Harvest Dark, MD 02/27/15 [phone]

## 2015-02-27 NOTE — ED Notes (Signed)
Pt sleeping, resps unlabored. Pt without tremors noted to fingertip to fingertip while pt sleeping. Skin normal color warm and dry.

## 2015-02-27 NOTE — ED Notes (Signed)
Pt continues to sleep. Pt is arousable to verbal stimuli from rn then back to sleep. No tremors felt fingertip to fingertip while pt sleeping. Skin dry.

## 2015-02-27 NOTE — ED Notes (Signed)
Pt continues to sleep. No tremors felt fingertip to fingertip.

## 2015-02-27 NOTE — ED Notes (Signed)
Pt continues to sleep. No tremors felt fingertip to fingertip. Skin warm and dry. Pt on oxygen at 2lpm via [place]. Cont pox in place.

## 2015-02-27 NOTE — ED Notes (Addendum)
Pt sitting up in bed sipping on po fluids. Pt with tremors noted to hands. Pt states "i think i'm starting to shake." md notified order for ativan received.

## 2015-02-27 NOTE — ED Notes (Signed)
Pt continues to sleep, arousable to verbal stimuli by rn. No tremors felt fingertip to fingertip while pt sleeping. Skin dry. resps unlabored. Oxygen continues to infuse via [place] at 2lpm.

## 2015-02-27 NOTE — ED Notes (Signed)
Pt sleeping, resps unlabored. Pt awakes easily to verbal stimuli. No tremors noted. Pt provided with urinal. Pt remains on cont pox and blood pressure cuff. No sweats noted.

## 2015-02-27 NOTE — ED Notes (Signed)
Pt provided with urinal and encouraged to provided urine sample. Vss.

## 2016-03-23 ENCOUNTER — Emergency Department: Payer: Medicaid Other

## 2016-03-23 ENCOUNTER — Encounter: Payer: Self-pay | Admitting: Emergency Medicine

## 2016-03-23 ENCOUNTER — Inpatient Hospital Stay
Admission: EM | Admit: 2016-03-23 | Discharge: 2016-03-27 | DRG: 181 | Disposition: A | Discharge status: Home / Self Care | Payer: Medicaid Other | Attending: Internal Medicine | Admitting: Internal Medicine

## 2016-03-23 DIAGNOSIS — Z801 Family history of malignant neoplasm of trachea, bronchus and lung: Secondary | ICD-10-CM

## 2016-03-23 DIAGNOSIS — A419 Sepsis, unspecified organism: Secondary | ICD-10-CM

## 2016-03-23 DIAGNOSIS — E876 Hypokalemia: Secondary | ICD-10-CM | POA: Diagnosis present

## 2016-03-23 DIAGNOSIS — R918 Other nonspecific abnormal finding of lung field: Secondary | ICD-10-CM

## 2016-03-23 DIAGNOSIS — R109 Unspecified abdominal pain: Secondary | ICD-10-CM

## 2016-03-23 DIAGNOSIS — C3490 Malignant neoplasm of unspecified part of unspecified bronchus or lung: Principal | ICD-10-CM | POA: Diagnosis present

## 2016-03-23 DIAGNOSIS — C7970 Secondary malignant neoplasm of unspecified adrenal gland: Secondary | ICD-10-CM | POA: Diagnosis present

## 2016-03-23 DIAGNOSIS — F17210 Nicotine dependence, cigarettes, uncomplicated: Secondary | ICD-10-CM | POA: Diagnosis present

## 2016-03-23 DIAGNOSIS — C787 Secondary malignant neoplasm of liver and intrahepatic bile duct: Secondary | ICD-10-CM | POA: Diagnosis present

## 2016-03-23 DIAGNOSIS — K219 Gastro-esophageal reflux disease without esophagitis: Secondary | ICD-10-CM | POA: Diagnosis present

## 2016-03-23 DIAGNOSIS — M62838 Other muscle spasm: Secondary | ICD-10-CM | POA: Diagnosis present

## 2016-03-23 DIAGNOSIS — R000 Tachycardia, unspecified: Secondary | ICD-10-CM | POA: Diagnosis present

## 2016-03-23 DIAGNOSIS — R9389 Abnormal findings on diagnostic imaging of other specified body structures: Secondary | ICD-10-CM

## 2016-03-23 DIAGNOSIS — Z720 Tobacco use: Secondary | ICD-10-CM | POA: Diagnosis present

## 2016-03-23 DIAGNOSIS — D638 Anemia in other chronic diseases classified elsewhere: Secondary | ICD-10-CM | POA: Diagnosis present

## 2016-03-23 DIAGNOSIS — Z9889 Other specified postprocedural states: Secondary | ICD-10-CM

## 2016-03-23 DIAGNOSIS — I10 Essential (primary) hypertension: Secondary | ICD-10-CM | POA: Diagnosis present

## 2016-03-23 LAB — CBC WITH DIFFERENTIAL/PLATELET
Basophils Absolute: 0 10*3/uL (ref 0–0.1)
Eosinophils Absolute: 0.1 10*3/uL (ref 0–0.7)
Eosinophils Relative: 1 %
HCT: 41.5 % (ref 40.0–52.0)
HEMOGLOBIN: 14.2 g/dL (ref 13.0–18.0)
LYMPHS ABS: 1.8 10*3/uL (ref 1.0–3.6)
Lymphocytes Relative: 16 %
MCH: 29.5 pg (ref 26.0–34.0)
MCHC: 34.2 g/dL (ref 32.0–36.0)
MCV: 86.3 fL (ref 80.0–100.0)
Monocytes Absolute: 1.5 10*3/uL — ABNORMAL HIGH (ref 0.2–1.0)
NEUTROS ABS: 7.9 10*3/uL — AB (ref 1.4–6.5)
Platelets: 343 10*3/uL (ref 150–440)
RBC: 4.81 MIL/uL (ref 4.40–5.90)
RDW: 13.1 % (ref 11.5–14.5)
WBC: 11.3 10*3/uL — ABNORMAL HIGH (ref 3.8–10.6)

## 2016-03-23 LAB — URINALYSIS COMPLETE WITH MICROSCOPIC (ARMC ONLY)
Bacteria, UA: NONE SEEN
Bilirubin Urine: NEGATIVE
Glucose, UA: NEGATIVE mg/dL
HGB URINE DIPSTICK: NEGATIVE
KETONES UR: NEGATIVE mg/dL
Leukocytes, UA: NEGATIVE
Nitrite: NEGATIVE
PH: 6 (ref 5.0–8.0)
PROTEIN: NEGATIVE mg/dL
RBC / HPF: NONE SEEN RBC/hpf (ref 0–5)
SPECIFIC GRAVITY, URINE: 1.014 (ref 1.005–1.030)

## 2016-03-23 LAB — COMPREHENSIVE METABOLIC PANEL
ALT: 36 U/L (ref 17–63)
ANION GAP: 9 (ref 5–15)
AST: 54 U/L — ABNORMAL HIGH (ref 15–41)
Albumin: 2.8 g/dL — ABNORMAL LOW (ref 3.5–5.0)
Alkaline Phosphatase: 75 U/L (ref 38–126)
BUN: 7 mg/dL (ref 6–20)
CHLORIDE: 103 mmol/L (ref 101–111)
CO2: 30 mmol/L (ref 22–32)
Calcium: 8.4 mg/dL — ABNORMAL LOW (ref 8.9–10.3)
Creatinine, Ser: 0.66 mg/dL (ref 0.61–1.24)
GFR calc non Af Amer: >60 mL/min (ref >60)
Glucose, Bld: 128 mg/dL — ABNORMAL HIGH (ref 65–99)
POTASSIUM: 3.2 mmol/L — AB (ref 3.5–5.1)
SODIUM: 142 mmol/L (ref 135–145)
Total Bilirubin: 0.9 mg/dL (ref 0.3–1.2)
Total Protein: 7.2 g/dL (ref 6.5–8.1)

## 2016-03-23 LAB — LACTIC ACID, PLASMA: Lactic Acid, Venous: 2.5 mmol/L (ref 0.5–2.0)

## 2016-03-23 MED ORDER — PIPERACILLIN-TAZOBACTAM 3.375 G IVPB 30 MIN
3.3750 g | Freq: Once | INTRAVENOUS | Status: AC
  Administered 2016-03-23: 3.375 g via INTRAVENOUS
  Filled 2016-03-23: qty 50

## 2016-03-23 MED ORDER — ONDANSETRON HCL 4 MG/2ML IJ SOLN
4.0000 mg | Freq: Once | INTRAMUSCULAR | Status: AC
  Administered 2016-03-23: 4 mg via INTRAVENOUS
  Filled 2016-03-23: qty 2

## 2016-03-23 MED ORDER — HYDROMORPHONE HCL 1 MG/ML IJ SOLN
1.0000 mg | Freq: Once | INTRAMUSCULAR | Status: AC
  Administered 2016-03-23: 1 mg via INTRAVENOUS

## 2016-03-23 MED ORDER — MORPHINE SULFATE (PF) 4 MG/ML IV SOLN
4.0000 mg | Freq: Once | INTRAVENOUS | Status: AC
  Administered 2016-03-23: 4 mg via INTRAVENOUS
  Filled 2016-03-23: qty 1

## 2016-03-23 MED ORDER — SODIUM CHLORIDE 0.9 % IV BOLUS (SEPSIS)
250.0000 mL | Freq: Once | INTRAVENOUS | Status: AC
Start: 2016-03-23 — End: 2016-03-23
  Administered 2016-03-23: 250 mL via INTRAVENOUS

## 2016-03-23 MED ORDER — SODIUM CHLORIDE 0.9 % IV BOLUS (SEPSIS)
1000.0000 mL | Freq: Once | INTRAVENOUS | Status: AC
Start: 2016-03-23 — End: 2016-03-23
  Administered 2016-03-23: 1000 mL via INTRAVENOUS

## 2016-03-23 MED ORDER — DEXTROSE 5 % IV SOLN
INTRAVENOUS | Status: AC
  Filled 2016-03-23 (×2): qty 10

## 2016-03-23 MED ORDER — VANCOMYCIN HCL IN DEXTROSE 1-5 GM/200ML-% IV SOLN
1000.0000 mg | Freq: Once | INTRAVENOUS | Status: AC
  Administered 2016-03-24: 1000 mg via INTRAVENOUS
  Filled 2016-03-23: qty 200

## 2016-03-23 MED ORDER — IOPAMIDOL (ISOVUE-300) INJECTION 61%
100.0000 mL | Freq: Once | INTRAVENOUS | Status: AC | PRN
  Administered 2016-03-23: 100 mL via INTRAVENOUS

## 2016-03-23 MED ORDER — PIPERACILLIN-TAZOBACTAM 3.375 G IVPB
3.3750 g | Freq: Three times a day (TID) | INTRAVENOUS | Status: DC
  Administered 2016-03-24 – 2016-03-26 (×8): 3.375 g via INTRAVENOUS
  Filled 2016-03-23 (×10): qty 50

## 2016-03-23 MED ORDER — HYDROMORPHONE HCL 1 MG/ML IJ SOLN
INTRAMUSCULAR | Status: AC
  Filled 2016-03-23: qty 1

## 2016-03-23 MED ORDER — DEXTROSE 5 % IV SOLN
2.0000 g | Freq: Once | INTRAVENOUS | Status: AC
  Administered 2016-03-23: 2 g via INTRAVENOUS
  Filled 2016-03-23: qty 2

## 2016-03-23 MED ORDER — SODIUM CHLORIDE 0.9 % IV BOLUS (SEPSIS)
1000.0000 mL | Freq: Once | INTRAVENOUS | Status: AC
  Administered 2016-03-23 (×2): 1000 mL via INTRAVENOUS

## 2016-03-23 MED ORDER — DIATRIZOATE MEGLUMINE & SODIUM 66-10 % PO SOLN
15.0000 mL | ORAL | Status: AC
  Administered 2016-03-23 (×2): 15 mL via ORAL

## 2016-03-23 NOTE — ED Notes (Signed)
Patient c/o back pain with radiation through to the front

## 2016-03-23 NOTE — ED Provider Notes (Addendum)
[facility] Emergency Department Provider Note   ____________________________________________  Time seen: Seen upon arrival to the emergency department I have reviewed the triage vital signs and the nursing notes.   HISTORY  Chief Complaint Flank Pain   HPI Dennis Perkins is a 60 y.o. male with a history of kidney stones who is presenting to the emergency department with right flank pain. He said the pain started about 11:30 last night but then became acutely severe at 3 AM. He says it is now 10 out of 10 to the right lower back. He describes the pain as stabbing. He has had nausea but no vomiting. Has only had a limited amount of urine production today. Does not note any blood in her urine. Has a history of kidney stones but the last time was 10 years ago.Denies any abdominal pain. Was given fentanyl as well as Zofran in route without resolution of the pain.   History reviewed. No pertinent past medical history.  There are no active problems to display for this patient.   Past Surgical History  Procedure Laterality Date  . I&d extremity Right 03/17/2013    Procedure: IRRIGATION AND DEBRIDEMENT EXTREMITY, flexor tendon repair;  Surgeon: Linna Hoff, MD;  Location: [place];  Service: Orthopedics;  Laterality: Right;  . Knee surgery Bilateral     Current Outpatient Rx  Name  Route  Sig  Dispense  Refill  . amoxicillin-clavulanate (AUGMENTIN) 875-125 MG per tablet   Oral   Take 1 tablet by mouth 2 (two) times daily.   20 tablet   0   . docusate sodium (COLACE) 100 MG capsule   Oral   Take 1 capsule (100 mg total) by mouth 2 (two) times daily.   30 capsule   0   . oxyCODONE-acetaminophen (PERCOCET) 10-325 MG per tablet   Oral   Take 1 tablet by mouth every 4 (four) hours as needed for pain.   40 tablet   0     Allergies Review of patient's allergies indicates no known allergies.  No family history on file.  Social History Social  History  Substance Use Topics  . Smoking status: Current Some Day Smoker    Types: Cigarettes  . Smokeless tobacco: Never Used  . Alcohol Use: 16.8 oz/week    28 Cans of beer per week    Review of Systems Constitutional: No fever/chills Eyes: No visual changes. ENT: No sore throat. Cardiovascular: Denies chest pain. Respiratory: Denies shortness of breath. Gastrointestinal: No abdominal pain.  no vomiting.  No diarrhea.  No constipation. Genitourinary: Negative for dysuria. Musculoskeletal: As above Skin: Negative for rash. Neurological: Negative for headaches, focal weakness or numbness.  10-point ROS otherwise negative.  ____________________________________________   PHYSICAL EXAM:  VITAL SIGNS: ED Triage Vitals  Enc Vitals Group     BP 03/23/16 1755 151/114 mmHg     Pulse Rate 03/23/16 1755 108     Resp 03/23/16 1755 18     Temp 03/23/16 1755 102.3 F (39.1 C)     Temp Source 03/23/16 1755 Oral     SpO2 03/23/16 1755 98 %     Weight 03/23/16 1755 152 lb (68.947 kg)     Height 03/23/16 1755 '5\' 9"'$  (1.753 m)     Head Cir --      Peak Flow --      Pain Score 03/23/16 1757 10     Pain Loc --      Pain Edu? --  Excl. in [place]? --     Constitutional: Alert and oriented. Patient appears uncomfortable. Eyes: Conjunctivae are normal. PERRL. EOMI. Head: Atraumatic. Nose: No congestion/rhinnorhea. Mouth/Throat: Mucous membranes are moist.   Neck: No stridor.   Cardiovascular: Normal rate, regular rhythm. Grossly normal heart sounds.  Respiratory: Normal respiratory effort.  No retractions. Lungs CTAB. Gastrointestinal: Soft and nontender. No distention. Right-sided CVA tenderness to palpation. Musculoskeletal: No lower extremity tenderness nor edema.  No joint effusions. Neurologic:  Normal speech and language. No gross focal neurologic deficits are appreciated.  Skin:  Skin is warm, dry and intact. No rash noted. Psychiatric: Mood and affect are normal. Speech and  behavior are normal.  ____________________________________________   LABS (all labs ordered are listed, but only abnormal results are displayed)  Labs Reviewed  COMPREHENSIVE METABOLIC PANEL - Abnormal; Notable for the following:    Potassium 3.2 (*)    Glucose, Bld 128 (*)    Calcium 8.4 (*)    Albumin 2.8 (*)    AST 54 (*)    All other components within normal limits  CBC WITH DIFFERENTIAL/PLATELET - Abnormal; Notable for the following:    WBC 11.3 (*)    Neutro Abs 7.9 (*)    Monocytes Absolute 1.5 (*)    All other components within normal limits  LACTIC ACID, PLASMA - Abnormal; Notable for the following:    Lactic Acid, Venous 2.5 (*)    All other components within normal limits  URINALYSIS COMPLETEWITH MICROSCOPIC ([facility] ONLY) - Abnormal; Notable for the following:    Color, Urine YELLOW (*)    APPearance CLEAR (*)    Squamous Epithelial / LPF 0-5 (*)    All other components within normal limits  CULTURE, BLOOD (ROUTINE X 2)  CULTURE, BLOOD (ROUTINE X 2)  URINE CULTURE  LACTIC ACID, PLASMA   ____________________________________________  EKG   ____________________________________________  RADIOLOGY   CT Abdomen Pelvis W Contrast (Final result) Result time: 03/23/16 22:01:03   Final result by Rad Results In Interface (03/23/16 22:01:03)   Narrative:   CLINICAL DATA: Indeterminate liver masses detected on unenhanced CT abdomen study from earlier today performed in the setting of right flank pain and chills. New pleural thickening and apical right upper lobe opacity on chest radiograph from earlier today.  EXAM: CT CHEST, ABDOMEN, AND PELVIS WITH CONTRAST  TECHNIQUE: Multidetector CT imaging of the chest, abdomen and pelvis was performed following the standard protocol during bolus administration of intravenous contrast.  CONTRAST: 136m ISOVUE-300 IOPAMIDOL (ISOVUE-300) INJECTION 61%  COMPARISON: Unenhanced CT abdomen/pelvis from earlier today.  Chest radiograph from earlier today. 02/05/2015 CT chest, abdomen and pelvis.  FINDINGS: CT CHEST  Mediastinum/Nodes: Normal heart size. No pericardial fluid/thickening. Left anterior descending coronary atherosclerosis. Atherosclerotic nonaneurysmal thoracic aorta. Normal caliber pulmonary arteries. No central pulmonary emboli. Normal visualized thyroid. Normal esophagus. No axillary adenopathy. Right hilar adenopathy measuring up to 1.5 cm (series 4/ image 71). No left hilar adenopathy. Bulky right paratracheal adenopathy measuring up to 2.8 cm (series 4/image 50).  Lungs/Pleura: No pneumothorax. Trace basilar right pleural effusion. There is an irregular 3.4 x 2.2 cm pleural-based lateral apical right upper lobe lung mass (series 8/image 30), probably representing interval growth of the previously described 0.8 x 0.7 cm apical ground-glass right upper lobe pulmonary nodule on the 02/05/2015 chest CT study. This mass is continuous with extensive circumferential lobular pleural thickening and enhancement in the apical right pleural space, which appears to invade the anterior mediastinum at the level of the proximal  aortic arch (series 4/ image 59), and is also continuous with the right paratracheal adenopathy. Patchy ground-glass opacity throughout the parahilar and apical right upper lobe with associated interlobular septal thickening in this location. Apical left upper lobe pulmonary nodules measuring up to 0.6 cm (series 8/ image 16) are not appreciably changed since 02/05/2015. No additional new significant pulmonary nodules.  Musculoskeletal: No aggressive appearing focal osseous lesions.  CT ABDOMEN AND PELVIS  Hepatobiliary: At least 3 liver masses scattered in the right liver lobe, all new since 02/05/2015, demonstrating peripheral hyperenhancement, measuring 3.1 x 2.9 cm in the segment 6 right liver lobe (series 4/ image 154), 1.4 x 1.4 cm in segment 5 right liver  lobe (series 4/ image 157) and 1.7 x 1.6 cm in the segment 8 right liver lobe (series 4/ image 128). Normal gallbladder with no radiopaque cholelithiasis. No biliary ductal dilatation.  Pancreas: Normal, with no mass or duct dilation.  Spleen: Normal size. No mass.  Adrenals/Urinary Tract: New 1.2 cm right adrenal nodule (series 4/ image 152). No left adrenal nodule. No hydronephrosis. Simple 1.3 cm upper left renal cyst. Subcentimeter hypodense renal cortical lesion in the medial interpolar left kidney, too small to characterize, unchanged since 02/05/2015, suggesting a benign renal cyst. Normal bladder.  Stomach/Bowel: Grossly normal stomach. Normal caliber small bowel with no small bowel wall thickening. Normal appendix. Normal large bowel with no diverticulosis, large bowel wall thickening or pericolonic fat stranding.  Vascular/Lymphatic: Atherosclerotic nonaneurysmal abdominal aorta. Patent portal, splenic, hepatic and renal veins. New mildly enlarged 1.5 cm portacaval node (series 4/ image 158). No additional pathologically enlarged abdominopelvic lymph nodes.  Reproductive: Normal size prostate.  Other: No pneumoperitoneum, ascites or focal fluid collection.  Musculoskeletal: No aggressive appearing focal osseous lesions. Mild degenerative changes in the lumbar spine.  IMPRESSION: 1. Pleural-based lateral apical right upper lobe irregular 3.4 cm lung mass, probably representing interval growth of the 0.8 cm ground-glass pulmonary nodule described on the 02/05/2015 chest CT study, and highly suspicious for a primary bronchogenic carcinoma. Patchy ground-glass opacity and interlobular septal thickening in the right upper lobe of the lung probably represents alveolar hemorrhage and/or lymphangitic tumor. 2. Extensive irregular pleural thickening and enhancement in the apical right pleural space worrisome for malignant pleural spread. Trace basilar right pleural  effusion. 3. New right hilar and bulky right paratracheal lymphadenopathy, suspicious for nodal metastases. 4. Three new liver masses worrisome for liver metastases. 5. New right adrenal nodule, worrisome for right adrenal metastasis. 6. New portacaval upper retroperitoneal adenopathy, suspicious for nodal metastasis. 7. Thoracic surgical and oncology consultation advised for biopsy planning. PET-CT advised for further staging.   Electronically Signed By: Ilona Sorrel M.D. On: 03/23/2016 22:01          CT Chest W Contrast (Final result) Result time: 03/23/16 22:01:03   Final result by Rad Results In Interface (03/23/16 22:01:03)   Narrative:   CLINICAL DATA: Indeterminate liver masses detected on unenhanced CT abdomen study from earlier today performed in the setting of right flank pain and chills. New pleural thickening and apical right upper lobe opacity on chest radiograph from earlier today.  EXAM: CT CHEST, ABDOMEN, AND PELVIS WITH CONTRAST  TECHNIQUE: Multidetector CT imaging of the chest, abdomen and pelvis was performed following the standard protocol during bolus administration of intravenous contrast.  CONTRAST: 190m ISOVUE-300 IOPAMIDOL (ISOVUE-300) INJECTION 61%  COMPARISON: Unenhanced CT abdomen/pelvis from earlier today. Chest radiograph from earlier today. 02/05/2015 CT chest, abdomen and pelvis.  FINDINGS: CT CHEST  Mediastinum/Nodes: Normal heart size. No pericardial fluid/thickening. Left anterior descending coronary atherosclerosis. Atherosclerotic nonaneurysmal thoracic aorta. Normal caliber pulmonary arteries. No central pulmonary emboli. Normal visualized thyroid. Normal esophagus. No axillary adenopathy. Right hilar adenopathy measuring up to 1.5 cm (series 4/ image 71). No left hilar adenopathy. Bulky right paratracheal adenopathy measuring up to 2.8 cm (series 4/image 50).  Lungs/Pleura: No pneumothorax. Trace basilar right  pleural effusion. There is an irregular 3.4 x 2.2 cm pleural-based lateral apical right upper lobe lung mass (series 8/image 30), probably representing interval growth of the previously described 0.8 x 0.7 cm apical ground-glass right upper lobe pulmonary nodule on the 02/05/2015 chest CT study. This mass is continuous with extensive circumferential lobular pleural thickening and enhancement in the apical right pleural space, which appears to invade the anterior mediastinum at the level of the proximal aortic arch (series 4/ image 59), and is also continuous with the right paratracheal adenopathy. Patchy ground-glass opacity throughout the parahilar and apical right upper lobe with associated interlobular septal thickening in this location. Apical left upper lobe pulmonary nodules measuring up to 0.6 cm (series 8/ image 16) are not appreciably changed since 02/05/2015. No additional new significant pulmonary nodules.  Musculoskeletal: No aggressive appearing focal osseous lesions.  CT ABDOMEN AND PELVIS  Hepatobiliary: At least 3 liver masses scattered in the right liver lobe, all new since 02/05/2015, demonstrating peripheral hyperenhancement, measuring 3.1 x 2.9 cm in the segment 6 right liver lobe (series 4/ image 154), 1.4 x 1.4 cm in segment 5 right liver lobe (series 4/ image 157) and 1.7 x 1.6 cm in the segment 8 right liver lobe (series 4/ image 128). Normal gallbladder with no radiopaque cholelithiasis. No biliary ductal dilatation.  Pancreas: Normal, with no mass or duct dilation.  Spleen: Normal size. No mass.  Adrenals/Urinary Tract: New 1.2 cm right adrenal nodule (series 4/ image 152). No left adrenal nodule. No hydronephrosis. Simple 1.3 cm upper left renal cyst. Subcentimeter hypodense renal cortical lesion in the medial interpolar left kidney, too small to characterize, unchanged since 02/05/2015, suggesting a benign renal cyst.  Normal bladder.  Stomach/Bowel: Grossly normal stomach. Normal caliber small bowel with no small bowel wall thickening. Normal appendix. Normal large bowel with no diverticulosis, large bowel wall thickening or pericolonic fat stranding.  Vascular/Lymphatic: Atherosclerotic nonaneurysmal abdominal aorta. Patent portal, splenic, hepatic and renal veins. New mildly enlarged 1.5 cm portacaval node (series 4/ image 158). No additional pathologically enlarged abdominopelvic lymph nodes.  Reproductive: Normal size prostate.  Other: No pneumoperitoneum, ascites or focal fluid collection.  Musculoskeletal: No aggressive appearing focal osseous lesions. Mild degenerative changes in the lumbar spine.  IMPRESSION: 1. Pleural-based lateral apical right upper lobe irregular 3.4 cm lung mass, probably representing interval growth of the 0.8 cm ground-glass pulmonary nodule described on the 02/05/2015 chest CT study, and highly suspicious for a primary bronchogenic carcinoma. Patchy ground-glass opacity and interlobular septal thickening in the right upper lobe of the lung probably represents alveolar hemorrhage and/or lymphangitic tumor. 2. Extensive irregular pleural thickening and enhancement in the apical right pleural space worrisome for malignant pleural spread. Trace basilar right pleural effusion. 3. New right hilar and bulky right paratracheal lymphadenopathy, suspicious for nodal metastases. 4. Three new liver masses worrisome for liver metastases. 5. New right adrenal nodule, worrisome for right adrenal metastasis. 6. New portacaval upper retroperitoneal adenopathy, suspicious for nodal metastasis. 7. Thoracic surgical and oncology consultation advised for biopsy planning. PET-CT advised for further staging.   Electronically Signed By:  Ilona Sorrel M.D. On: 03/23/2016 22:01          DG Chest 2 View (Final result) Result time: 03/23/16 19:42:59   Final result by Rad  Results In Interface (03/23/16 19:42:59)   Narrative:   CLINICAL DATA: Right abdominal/back pain. Right pleural fluid on CT abdomen study from earlier today.  EXAM: CHEST 2 VIEW  COMPARISON: 03/23/2016 unenhanced CT abdomen/ pelvis. 02/26/2015 chest radiograph.  FINDINGS: Normal heart size. There is thickening of the right paratracheal stripe, new. No pneumothorax. Trace basilar right pleural effusion. No left pleural effusion. There is prominent patchy opacity and irregular pleural thickening at the right lung apex, which is new since 02/26/2015. Clear left lung.  IMPRESSION: 1. Prominent patchy opacity and irregular pleural thickening at the right lung apex, new since 02/26/2015. Findings are nonspecific with the differential including pulmonary infection or neoplasm. 2. Thickening of the the right paratracheal stripe is new, cannot exclude right paratracheal mediastinal adenopathy. 3. Recommend further evaluation with chest CT with IV contrast. 4. Trace basilar right pleural effusion.   Electronically Signed By: Ilona Sorrel M.D. On: 03/23/2016 19:42          CT RENAL STONE STUDY (Final result) Result time: 03/23/16 18:58:12   Final result by Rad Results In Interface (03/23/16 18:58:12)   Narrative:   CLINICAL DATA: Right-sided flank pain at midnight. Chills. History kidney stones.  EXAM: CT ABDOMEN AND PELVIS WITHOUT CONTRAST  TECHNIQUE: Multidetector CT imaging of the abdomen and pelvis was performed following the standard protocol without IV contrast.  COMPARISON: 02/05/2015  FINDINGS: Lower chest: Normal heart size, without pericardial effusion. Trace right pleural fluid with adjacent dependent atelectasis. New.  Hepatobiliary: Subtle hypo attenuating right hepatic lobe low-density lesion of 2.7 cm on image 23/ series 2, new. A probable pericholecystic right hepatic lobe lesion of 1.5 cm on image 25/series 2. Normal gallbladder, without  biliary ductal dilatation.  Pancreas: Normal, without mass or ductal dilatation.  Spleen: Normal in size, without focal abnormality.  Adrenals/Urinary Tract: Normal adrenal glands. A calcification the upper pole left kidney is likely associated with a otherwise simple appearing cyst on the prior exam. No right-sided renal calculi or hydronephrosis. No hydroureter or ureteric calculi. No bladder calculi.  Stomach/Bowel: Portions of the stomach are underdistended. No gross abnormality identified. Scattered colonic diverticula. Normal terminal ileum and appendix. Normal small bowel.  Vascular/Lymphatic: Aortic atherosclerosis. No abdominopelvic adenopathy.  Reproductive: Normal prostate.  Other: No significant free fluid.  Musculoskeletal: Scattered pelvic bone islands. Mild disc bulge at the lumbosacral junction.  IMPRESSION: 1. No urinary tract calculi or hydronephrosis. 2. Subtle hypo attenuating right hepatic lobe lesion or lesions, likely new since the prior contrast-enhanced CT. Cannot exclude neoplastic process including metastatic disease or primary liver lesion/lesions. Recommend further evaluation with pre and post contrast outpatient abdominal MRI . If hepatic abscess is a concern in this patient with right-sided pain and fevers, contrast-enhanced liver protocol CT could be performed more emergently. 3. Trace right pleural fluid and adjacent atelectasis, new since prior CT. These results were called by telephone at the time of interpretation on 03/23/2016 at 6:56 pm to Dr. Larae Grooms , who verbally acknowledged these results.   Electronically Signed By: Abigail Miyamoto M.D. On: 03/23/2016 18:58       ____________________________________________   PROCEDURES   ____________________________________________   INITIAL IMPRESSION / ASSESSMENT AND PLAN / ED COURSE  Pertinent labs & imaging results that were available during my care of the patient  were reviewed  by me and considered in my medical decision making (see chart for details).  Sepsis protocol was initiated secondary to the patient's temperature being 102.3. Suspecting infected kidney stone.  ----------------------------------------- 11:22 PM on 03/23/2016 -----------------------------------------  Patient with what appears to be newly diagnosed metastatic lung cancer. I updated him as to his radiology results. He will need to be seen by oncology. I signed the patient out for admission to the hospitalist, Dr. Claria Dice.   Vancomycin added for broad-spectrum coverage.  Patient given Dilaudid for continued control of his pain. Possible postobstructive pneumonia from the cancer mass causing pain. ____________________________________________   FINAL CLINICAL IMPRESSION(S) / ED DIAGNOSES  Final diagnoses:  Right flank pain  Opacity noted on imaging study  Sepsis. Likely metastatic lung cancer.    NEW MEDICATIONS STARTED DURING THIS VISIT:  New Prescriptions   No medications on file     Note:  This document was prepared using Dragon voice recognition software and may include unintentional dictation errors.    Orbie Pyo, MD 03/23/16 2324  Case also discussed with oncology, Dr. Grayland Ormond, who will see the patient is a consult.  Orbie Pyo, MD 03/23/16 [phone]

## 2016-03-23 NOTE — ED Notes (Signed)
Patient to ER for right sided flank pain with nausea. Denies any blood in urine.

## 2016-03-23 NOTE — Progress Notes (Addendum)
Pharmacy Antibiotic Note  Dennis Perkins is a 60 y.o. male admitted on 03/23/2016 with an intra-abdominal infection and urinary tract infection.  Pharmacy has been consulted for Zosyn and vancomycin dosing.  Plan: 1. Zosyn 3.375 g IV q8 hours.  2. Vancomycin 1 gm IV x 1 in ED followed in 6 hours (stacked dosing) by vancomycin 750 mg IV Q8H, predicted trough 16 mcg/mL. Pharmacy will continue to follow and adjust as needed to maintain trough 15 to 20 mcg/mL.   Vd 48.2 L, Ke 0.085 hr-1, T1/2 8.2 hr  Height: '5\' 9"'$  (175.3 cm) Weight: 152 lb (68.947 kg) IBW/kg (Calculated) : 70.7  Temp (24hrs), Avg:102.3 F (39.1 C), Min:102.3 F (39.1 C), Max:102.3 F (39.1 C)   Recent Labs Lab 03/23/16 1818  WBC 11.3*  CREATININE 0.66  LATICACIDVEN 2.5*    Estimated Creatinine Clearance: 96.9 mL/min (by C-G formula based on Cr of 0.66).    No Known Allergies  Thank you for allowing pharmacy to be a part of this patient's care.  Mordechai Matuszak A. Andrews, [place].D., BCPS Clinical Pharmacist  03/23/2016 9:12 PM

## 2016-03-24 ENCOUNTER — Inpatient Hospital Stay: Payer: Medicaid Other

## 2016-03-24 DIAGNOSIS — Z720 Tobacco use: Secondary | ICD-10-CM | POA: Diagnosis present

## 2016-03-24 DIAGNOSIS — R109 Unspecified abdominal pain: Secondary | ICD-10-CM

## 2016-03-24 DIAGNOSIS — R918 Other nonspecific abnormal finding of lung field: Secondary | ICD-10-CM

## 2016-03-24 DIAGNOSIS — F17210 Nicotine dependence, cigarettes, uncomplicated: Secondary | ICD-10-CM

## 2016-03-24 DIAGNOSIS — Z9889 Other specified postprocedural states: Secondary | ICD-10-CM | POA: Diagnosis not present

## 2016-03-24 DIAGNOSIS — K769 Liver disease, unspecified: Secondary | ICD-10-CM

## 2016-03-24 DIAGNOSIS — C3490 Malignant neoplasm of unspecified part of unspecified bronchus or lung: Secondary | ICD-10-CM | POA: Diagnosis present

## 2016-03-24 DIAGNOSIS — K219 Gastro-esophageal reflux disease without esophagitis: Secondary | ICD-10-CM | POA: Diagnosis present

## 2016-03-24 DIAGNOSIS — C7970 Secondary malignant neoplasm of unspecified adrenal gland: Secondary | ICD-10-CM | POA: Diagnosis present

## 2016-03-24 DIAGNOSIS — Z801 Family history of malignant neoplasm of trachea, bronchus and lung: Secondary | ICD-10-CM | POA: Diagnosis not present

## 2016-03-24 DIAGNOSIS — R110 Nausea: Secondary | ICD-10-CM

## 2016-03-24 DIAGNOSIS — E876 Hypokalemia: Secondary | ICD-10-CM | POA: Diagnosis present

## 2016-03-24 DIAGNOSIS — I10 Essential (primary) hypertension: Secondary | ICD-10-CM | POA: Diagnosis present

## 2016-03-24 DIAGNOSIS — M62838 Other muscle spasm: Secondary | ICD-10-CM | POA: Diagnosis present

## 2016-03-24 DIAGNOSIS — D638 Anemia in other chronic diseases classified elsewhere: Secondary | ICD-10-CM | POA: Diagnosis present

## 2016-03-24 DIAGNOSIS — R000 Tachycardia, unspecified: Secondary | ICD-10-CM | POA: Diagnosis present

## 2016-03-24 DIAGNOSIS — C787 Secondary malignant neoplasm of liver and intrahepatic bile duct: Secondary | ICD-10-CM | POA: Diagnosis present

## 2016-03-24 DIAGNOSIS — R079 Chest pain, unspecified: Secondary | ICD-10-CM

## 2016-03-24 LAB — BASIC METABOLIC PANEL
ANION GAP: 9 (ref 5–15)
BUN: 5 mg/dL — ABNORMAL LOW (ref 6–20)
CALCIUM: 7.6 mg/dL — AB (ref 8.9–10.3)
CHLORIDE: 102 mmol/L (ref 101–111)
CO2: 25 mmol/L (ref 22–32)
Creatinine, Ser: 0.69 mg/dL (ref 0.61–1.24)
GFR calc non Af Amer: >60 mL/min (ref >60)
GLUCOSE: 86 mg/dL (ref 65–99)
Potassium: 3 mmol/L — ABNORMAL LOW (ref 3.5–5.1)
Sodium: 136 mmol/L (ref 135–145)

## 2016-03-24 LAB — LACTIC ACID, PLASMA
LACTIC ACID, VENOUS: 1.1 mmol/L (ref 0.5–2.0)
Lactic Acid, Venous: 1.4 mmol/L (ref 0.5–2.0)

## 2016-03-24 LAB — CBC
HCT: 37.3 % — ABNORMAL LOW (ref 40.0–52.0)
HEMOGLOBIN: 12.7 g/dL — AB (ref 13.0–18.0)
MCH: 29.9 pg (ref 26.0–34.0)
MCHC: 34.1 g/dL (ref 32.0–36.0)
MCV: 87.6 fL (ref 80.0–100.0)
Platelets: 291 10*3/uL (ref 150–440)
RBC: 4.26 MIL/uL — AB (ref 4.40–5.90)
RDW: 13 % (ref 11.5–14.5)
WBC: 13.1 10*3/uL — ABNORMAL HIGH (ref 3.8–10.6)

## 2016-03-24 LAB — PROTIME-INR
INR: 1.18
PROTHROMBIN TIME: 15.2 s — AB (ref 11.4–15.0)

## 2016-03-24 MED ORDER — VANCOMYCIN HCL IN DEXTROSE 1-5 GM/200ML-% IV SOLN
INTRAVENOUS | Status: AC
  Filled 2016-03-24: qty 200

## 2016-03-24 MED ORDER — VANCOMYCIN HCL IN DEXTROSE 750-5 MG/150ML-% IV SOLN
750.0000 mg | Freq: Three times a day (TID) | INTRAVENOUS | Status: DC
  Administered 2016-03-24 – 2016-03-25 (×4): 750 mg via INTRAVENOUS
  Filled 2016-03-24 (×5): qty 150

## 2016-03-24 MED ORDER — METOPROLOL TARTRATE 5 MG/5ML IV SOLN
5.0000 mg | Freq: Four times a day (QID) | INTRAVENOUS | Status: DC | PRN
  Filled 2016-03-24 (×2): qty 5

## 2016-03-24 MED ORDER — HYDROMORPHONE HCL 1 MG/ML IJ SOLN
2.0000 mg | INTRAMUSCULAR | Status: DC | PRN
  Administered 2016-03-24 – 2016-03-25 (×8): 2 mg via INTRAVENOUS
  Filled 2016-03-24 (×8): qty 2

## 2016-03-24 MED ORDER — FAMOTIDINE 20 MG PO TABS
20.0000 mg | ORAL_TABLET | Freq: Two times a day (BID) | ORAL | Status: DC
  Administered 2016-03-24 – 2016-03-27 (×7): 20 mg via ORAL
  Filled 2016-03-24 (×7): qty 1

## 2016-03-24 MED ORDER — ACETAMINOPHEN 650 MG RE SUPP: 650.0000 mg | Freq: Four times a day (QID) | RECTAL | Status: DC | PRN

## 2016-03-24 MED ORDER — HYDROMORPHONE HCL 1 MG/ML IJ SOLN
2.0000 mg | Freq: Once | INTRAMUSCULAR | Status: AC
  Administered 2016-03-24: 2 mg via INTRAVENOUS
  Filled 2016-03-24: qty 2

## 2016-03-24 MED ORDER — OXYCODONE HCL 5 MG PO TABS
5.0000 mg | ORAL_TABLET | ORAL | Status: DC | PRN
  Administered 2016-03-24 – 2016-03-25 (×3): 5 mg via ORAL
  Filled 2016-03-24 (×3): qty 1

## 2016-03-24 MED ORDER — BISACODYL 5 MG PO TBEC: 5.0000 mg | DELAYED_RELEASE_TABLET | Freq: Every day | ORAL | Status: DC | PRN

## 2016-03-24 MED ORDER — METOPROLOL TARTRATE 25 MG PO TABS
25.0000 mg | ORAL_TABLET | Freq: Two times a day (BID) | ORAL | Status: DC
  Administered 2016-03-24 – 2016-03-27 (×7): 25 mg via ORAL
  Filled 2016-03-24 (×8): qty 1

## 2016-03-24 MED ORDER — ONDANSETRON HCL 4 MG/2ML IJ SOLN
4.0000 mg | Freq: Four times a day (QID) | INTRAMUSCULAR | Status: DC | PRN
  Administered 2016-03-24 – 2016-03-25 (×4): 4 mg via INTRAVENOUS
  Filled 2016-03-24 (×3): qty 2

## 2016-03-24 MED ORDER — ENOXAPARIN SODIUM 40 MG/0.4ML ~~LOC~~ SOLN
40.0000 mg | Freq: Every day | SUBCUTANEOUS | Status: DC
  Administered 2016-03-24: 04:00:00 40 mg via SUBCUTANEOUS
  Filled 2016-03-24: qty 0.4

## 2016-03-24 MED ORDER — POTASSIUM CHLORIDE CRYS ER 20 MEQ PO TBCR
40.0000 meq | EXTENDED_RELEASE_TABLET | Freq: Once | ORAL | Status: AC
  Administered 2016-03-24: 40 meq via ORAL
  Filled 2016-03-24: qty 2

## 2016-03-24 MED ORDER — ONDANSETRON HCL 4 MG/2ML IJ SOLN
INTRAMUSCULAR | Status: AC
  Administered 2016-03-24: 13:00:00
  Filled 2016-03-24: qty 2

## 2016-03-24 MED ORDER — GADOBENATE DIMEGLUMINE 529 MG/ML IV SOLN
15.0000 mL | Freq: Once | INTRAVENOUS | Status: AC | PRN
  Administered 2016-03-24: 13 mL via INTRAVENOUS

## 2016-03-24 MED ORDER — ACETAMINOPHEN 325 MG PO TABS
650.0000 mg | ORAL_TABLET | Freq: Four times a day (QID) | ORAL | Status: DC | PRN
  Administered 2016-03-24 – 2016-03-27 (×5): 650 mg via ORAL
  Filled 2016-03-24 (×6): qty 2

## 2016-03-24 MED ORDER — GI COCKTAIL ~~LOC~~
30.0000 mL | Freq: Once | ORAL | Status: AC
  Administered 2016-03-24: 10:00:00 30 mL via ORAL
  Filled 2016-03-24: qty 30

## 2016-03-24 NOTE — Progress Notes (Addendum)
Patient ID: Dennis Perkins, male   DOB: 28-Jan-1956, 60 y.o.   MRN: [id number] [facility] PROGRESS NOTE  Dennis Perkins SWF:[id number] DOB: 03-13-1956 DOA: 03/23/2016 PCP: Default, Provider, MD  HPI/Subjective: Patient with 7 out of 10 pain right chest and ribs. Feeling a little bit better with pain medication.  Objective: Filed Vitals:   03/24/16 0535 03/24/16 0955  BP:  104/65  Pulse: 97 97  Temp: 101.2 F (38.4 C) 99 F (37.2 C)  Resp:  20    Filed Weights   03/23/16 1755 03/24/16 0338  Weight: 68.947 kg (152 lb) 67.087 kg (147 lb 14.4 oz)    ROS: Review of Systems  Constitutional: Positive for fever. Negative for chills.  Eyes: Negative for blurred vision.  Respiratory: Positive for cough and shortness of breath.   Cardiovascular: Positive for chest pain.  Gastrointestinal: Positive for nausea. Negative for vomiting, abdominal pain, diarrhea and constipation.  Genitourinary: Negative for dysuria.  Musculoskeletal: Negative for joint pain.  Neurological: Negative for dizziness and headaches.   Exam: Physical Exam  Constitutional: He is oriented to person, place, and time.  HENT:  Nose: No mucosal edema.  Mouth/Throat: No oropharyngeal exudate or posterior oropharyngeal edema.  Eyes: Conjunctivae, EOM and lids are normal. Pupils are equal, round, and reactive to light.  Neck: No JVD present. Carotid bruit is not present. No edema present. No thyroid mass and no thyromegaly present.  Cardiovascular: S1 normal and S2 normal.  Exam reveals no gallop.   No murmur heard. Pulses:      Dorsalis pedis pulses are 2+ on the right side, and 2+ on the left side.  Respiratory: No respiratory distress. He has no wheezes. He has rhonchi in the right lower field. He has no rales.  GI: Soft. Bowel sounds are normal. There is no tenderness.  Musculoskeletal:       Right ankle: He exhibits no swelling.       Left ankle: He exhibits no swelling.  Lymphadenopathy:    He has no  cervical adenopathy.  Neurological: He is alert and oriented to person, place, and time. No cranial nerve deficit.  Skin: Skin is warm. No rash noted. Nails show no clubbing.  Psychiatric: He has a normal mood and affect.      Data Reviewed: Basic Metabolic Panel:  Recent Labs Lab 03/23/16 1818 03/24/16 0442  NA 142 136  K 3.2* 3.0*  CL 103 102  CO2 30 25  GLUCOSE 128* 86  BUN 7 5*  CREATININE 0.66 0.69  CALCIUM 8.4* 7.6*   Liver Function Tests:  Recent Labs Lab 03/23/16 1818  AST 54*  ALT 36  ALKPHOS 75  BILITOT 0.9  PROT 7.2  ALBUMIN 2.8*   CBC:  Recent Labs Lab 03/23/16 1818 03/24/16 0442  WBC 11.3* 13.1*  NEUTROABS 7.9*  --   HGB 14.2 12.7*  HCT 41.5 37.3*  MCV 86.3 87.6  PLT 343 291    Recent Results (from the past 240 hour(s))  Blood Culture (routine x 2)     Status: None (Preliminary result)   Collection Time: 03/23/16  6:18 PM  Result Value Ref Range Status   Specimen Description BLOOD RIGHT ANTECUBITAL  Final   Special Requests   Final    BOTTLES DRAWN AEROBIC AND ANAEROBIC  AER 10CC ANA 6CC   Culture NO GROWTH < 24 HOURS  Final   Report Status PENDING  Incomplete  Blood Culture (routine x 2)     Status: None (  Preliminary result)   Collection Time: 03/23/16  6:18 PM  Result Value Ref Range Status   Specimen Description BLOOD LEFT ANTECUBITAL  Final   Special Requests BOTTLES DRAWN AEROBIC AND ANAEROBIC  8CC  Final   Culture NO GROWTH < 24 HOURS  Final   Report Status PENDING  Incomplete     Studies: Dg Chest 2 View  03/23/2016  CLINICAL DATA:  Right abdominal/back pain. Right pleural fluid on CT abdomen study from earlier today. EXAM: CHEST  2 VIEW COMPARISON:  03/23/2016 unenhanced CT abdomen/ pelvis. 02/26/2015 chest radiograph. FINDINGS: Normal heart size. There is thickening of the right paratracheal stripe, new. No pneumothorax. Trace basilar right pleural effusion. No left pleural effusion. There is prominent patchy opacity and  irregular pleural thickening at the right lung apex, which is new since 02/26/2015. Clear left lung. IMPRESSION: 1. Prominent patchy opacity and irregular pleural thickening at the right lung apex, new since 02/26/2015. Findings are nonspecific with the differential including pulmonary infection or neoplasm. 2. Thickening of the the right paratracheal stripe is new, cannot exclude right paratracheal mediastinal adenopathy. 3. Recommend further evaluation with chest CT with IV contrast. 4. Trace basilar right pleural effusion. Electronically Signed   By: Ilona Sorrel M.D.   On: 03/23/2016 19:42   Ct Chest W Contrast  03/23/2016  CLINICAL DATA:  Indeterminate liver masses detected on unenhanced CT abdomen study from earlier today performed in the setting of right flank pain and chills. New pleural thickening and apical right upper lobe opacity on chest radiograph from earlier today. EXAM: CT CHEST, ABDOMEN, AND PELVIS WITH CONTRAST TECHNIQUE: Multidetector CT imaging of the chest, abdomen and pelvis was performed following the standard protocol during bolus administration of intravenous contrast. CONTRAST:  126m ISOVUE-300 IOPAMIDOL (ISOVUE-300) INJECTION 61% COMPARISON:  Unenhanced CT abdomen/pelvis from earlier today. Chest radiograph from earlier today. 02/05/2015 CT chest, abdomen and pelvis. FINDINGS: CT CHEST Mediastinum/Nodes: Normal heart size. No pericardial fluid/thickening. Left anterior descending coronary atherosclerosis. Atherosclerotic nonaneurysmal thoracic aorta. Normal caliber pulmonary arteries. No central pulmonary emboli. Normal visualized thyroid. Normal esophagus. No axillary adenopathy. Right hilar adenopathy measuring up to 1.5 cm (series 4/ image 71). No left hilar adenopathy. Bulky right paratracheal adenopathy measuring up to 2.8 cm (series 4/image 50). Lungs/Pleura: No pneumothorax. Trace basilar right pleural effusion. There is an irregular 3.4 x 2.2 cm pleural-based lateral apical  right upper lobe lung mass (series 8/image 30), probably representing interval growth of the previously described 0.8 x 0.7 cm apical ground-glass right upper lobe pulmonary nodule on the 02/05/2015 chest CT study. This mass is continuous with extensive circumferential lobular pleural thickening and enhancement in the apical right pleural space, which appears to invade the anterior mediastinum at the level of the proximal aortic arch (series 4/ image 59), and is also continuous with the right paratracheal adenopathy. Patchy ground-glass opacity throughout the parahilar and apical right upper lobe with associated interlobular septal thickening in this location. Apical left upper lobe pulmonary nodules measuring up to 0.6 cm (series 8/ image 16) are not appreciably changed since 02/05/2015. No additional new significant pulmonary nodules. Musculoskeletal:  No aggressive appearing focal osseous lesions. CT ABDOMEN AND PELVIS Hepatobiliary: At least 3 liver masses scattered in the right liver lobe, all new since 02/05/2015, demonstrating peripheral hyperenhancement, measuring 3.1 x 2.9 cm in the segment 6 right liver lobe (series 4/ image 154), 1.4 x 1.4 cm in segment 5 right liver lobe (series 4/ image 157) and 1.7 x 1.6 cm in  the segment 8 right liver lobe (series 4/ image 128). Normal gallbladder with no radiopaque cholelithiasis. No biliary ductal dilatation. Pancreas: Normal, with no mass or duct dilation. Spleen: Normal size. No mass. Adrenals/Urinary Tract: New 1.2 cm right adrenal nodule (series 4/ image 152). No left adrenal nodule. No hydronephrosis. Simple 1.3 cm upper left renal cyst. Subcentimeter hypodense renal cortical lesion in the medial interpolar left kidney, too small to characterize, unchanged since 02/05/2015, suggesting a benign renal cyst. Normal bladder. Stomach/Bowel: Grossly normal stomach. Normal caliber small bowel with no small bowel wall thickening. Normal appendix. Normal large bowel  with no diverticulosis, large bowel wall thickening or pericolonic fat stranding. Vascular/Lymphatic: Atherosclerotic nonaneurysmal abdominal aorta. Patent portal, splenic, hepatic and renal veins. New mildly enlarged 1.5 cm portacaval node (series 4/ image 158). No additional pathologically enlarged abdominopelvic lymph nodes. Reproductive: Normal size prostate. Other: No pneumoperitoneum, ascites or focal fluid collection. Musculoskeletal: No aggressive appearing focal osseous lesions. Mild degenerative changes in the lumbar spine. IMPRESSION: 1. Pleural-based lateral apical right upper lobe irregular 3.4 cm lung mass, probably representing interval growth of the 0.8 cm ground-glass pulmonary nodule described on the 02/05/2015 chest CT study, and highly suspicious for a primary bronchogenic carcinoma. Patchy ground-glass opacity and interlobular septal thickening in the right upper lobe of the lung probably represents alveolar hemorrhage and/or lymphangitic tumor. 2. Extensive irregular pleural thickening and enhancement in the apical right pleural space worrisome for malignant pleural spread. Trace basilar right pleural effusion. 3. New right hilar and bulky right paratracheal lymphadenopathy, suspicious for nodal metastases. 4. Three new liver masses worrisome for liver metastases. 5. New right adrenal nodule, worrisome for right adrenal metastasis. 6. New portacaval upper retroperitoneal adenopathy, suspicious for nodal metastasis. 7. Thoracic surgical and oncology consultation advised for biopsy planning. PET-CT advised for further staging. Electronically Signed   By: Ilona Sorrel M.D.   On: 03/23/2016 22:01   Mr Jeri Cos AO Contrast  03/24/2016  ADDENDUM REPORT: 03/24/2016 13:03 ADDENDUM: Tiny region of blood breakdown products left parietal lobe may reflect result of prior hemorrhagic ischemia. Electronically Signed   By: Genia Del M.D.   On: 03/24/2016 13:03  03/24/2016  CLINICAL DATA:  60 year old  male with lung cancer. Staging. Initial encounter. EXAM: MRI HEAD WITHOUT AND WITH CONTRAST TECHNIQUE: Multiplanar, multiecho pulse sequences of the brain and surrounding structures were obtained without and with intravenous contrast. CONTRAST:  31m MULTIHANCE GADOBENATE DIMEGLUMINE 529 MG/ML IV SOLN COMPARISON:  02/05/2015 head CT. FINDINGS: Exam is motion degraded. No discrete parenchymal enhancing lesion however, on diffusion sequence punctate areas of restricted motion medial right parietal lobe, left frontal lobe and the right cerebellum raises possibility of small nonenhancing intracranial metastatic lesions. Heterogeneous bone marrow in a diffuse fashion without focal osseous destructive lesion. No acute infarct or intracranial hemorrhage. Mild chronic small vessel disease changes. Global atrophy without hydrocephalus. Major intracranial vascular structures are patent. Complete opacification left maxillary sinus with small size suggests and result of chronic obstruction. Diminutive size pituitary gland. No worrisome orbital abnormality noted. IMPRESSION: Exam is motion degraded. No discrete parenchymal enhancing lesion however, on diffusion sequence punctate areas of restricted motion medial right parietal lobe, left frontal lobe and the right cerebellum raises possibility of small nonenhancing intracranial metastatic lesions. Heterogeneous bone marrow in a diffuse fashion without focal osseous destructive lesion. Mild chronic small vessel disease changes. Global atrophy without hydrocephalus. Complete opacification left maxillary sinus with small size suggests and result of chronic obstruction. Electronically Signed: By: SGenia Del  M.D. On: 03/24/2016 12:50   Ct Abdomen Pelvis W Contrast  03/23/2016  CLINICAL DATA:  Indeterminate liver masses detected on unenhanced CT abdomen study from earlier today performed in the setting of right flank pain and chills. New pleural thickening and apical right upper  lobe opacity on chest radiograph from earlier today. EXAM: CT CHEST, ABDOMEN, AND PELVIS WITH CONTRAST TECHNIQUE: Multidetector CT imaging of the chest, abdomen and pelvis was performed following the standard protocol during bolus administration of intravenous contrast. CONTRAST:  137m ISOVUE-300 IOPAMIDOL (ISOVUE-300) INJECTION 61% COMPARISON:  Unenhanced CT abdomen/pelvis from earlier today. Chest radiograph from earlier today. 02/05/2015 CT chest, abdomen and pelvis. FINDINGS: CT CHEST Mediastinum/Nodes: Normal heart size. No pericardial fluid/thickening. Left anterior descending coronary atherosclerosis. Atherosclerotic nonaneurysmal thoracic aorta. Normal caliber pulmonary arteries. No central pulmonary emboli. Normal visualized thyroid. Normal esophagus. No axillary adenopathy. Right hilar adenopathy measuring up to 1.5 cm (series 4/ image 71). No left hilar adenopathy. Bulky right paratracheal adenopathy measuring up to 2.8 cm (series 4/image 50). Lungs/Pleura: No pneumothorax. Trace basilar right pleural effusion. There is an irregular 3.4 x 2.2 cm pleural-based lateral apical right upper lobe lung mass (series 8/image 30), probably representing interval growth of the previously described 0.8 x 0.7 cm apical ground-glass right upper lobe pulmonary nodule on the 02/05/2015 chest CT study. This mass is continuous with extensive circumferential lobular pleural thickening and enhancement in the apical right pleural space, which appears to invade the anterior mediastinum at the level of the proximal aortic arch (series 4/ image 59), and is also continuous with the right paratracheal adenopathy. Patchy ground-glass opacity throughout the parahilar and apical right upper lobe with associated interlobular septal thickening in this location. Apical left upper lobe pulmonary nodules measuring up to 0.6 cm (series 8/ image 16) are not appreciably changed since 02/05/2015. No additional new significant pulmonary  nodules. Musculoskeletal:  No aggressive appearing focal osseous lesions. CT ABDOMEN AND PELVIS Hepatobiliary: At least 3 liver masses scattered in the right liver lobe, all new since 02/05/2015, demonstrating peripheral hyperenhancement, measuring 3.1 x 2.9 cm in the segment 6 right liver lobe (series 4/ image 154), 1.4 x 1.4 cm in segment 5 right liver lobe (series 4/ image 157) and 1.7 x 1.6 cm in the segment 8 right liver lobe (series 4/ image 128). Normal gallbladder with no radiopaque cholelithiasis. No biliary ductal dilatation. Pancreas: Normal, with no mass or duct dilation. Spleen: Normal size. No mass. Adrenals/Urinary Tract: New 1.2 cm right adrenal nodule (series 4/ image 152). No left adrenal nodule. No hydronephrosis. Simple 1.3 cm upper left renal cyst. Subcentimeter hypodense renal cortical lesion in the medial interpolar left kidney, too small to characterize, unchanged since 02/05/2015, suggesting a benign renal cyst. Normal bladder. Stomach/Bowel: Grossly normal stomach. Normal caliber small bowel with no small bowel wall thickening. Normal appendix. Normal large bowel with no diverticulosis, large bowel wall thickening or pericolonic fat stranding. Vascular/Lymphatic: Atherosclerotic nonaneurysmal abdominal aorta. Patent portal, splenic, hepatic and renal veins. New mildly enlarged 1.5 cm portacaval node (series 4/ image 158). No additional pathologically enlarged abdominopelvic lymph nodes. Reproductive: Normal size prostate. Other: No pneumoperitoneum, ascites or focal fluid collection. Musculoskeletal: No aggressive appearing focal osseous lesions. Mild degenerative changes in the lumbar spine. IMPRESSION: 1. Pleural-based lateral apical right upper lobe irregular 3.4 cm lung mass, probably representing interval growth of the 0.8 cm ground-glass pulmonary nodule described on the 02/05/2015 chest CT study, and highly suspicious for a primary bronchogenic carcinoma. Patchy ground-glass opacity  and  interlobular septal thickening in the right upper lobe of the lung probably represents alveolar hemorrhage and/or lymphangitic tumor. 2. Extensive irregular pleural thickening and enhancement in the apical right pleural space worrisome for malignant pleural spread. Trace basilar right pleural effusion. 3. New right hilar and bulky right paratracheal lymphadenopathy, suspicious for nodal metastases. 4. Three new liver masses worrisome for liver metastases. 5. New right adrenal nodule, worrisome for right adrenal metastasis. 6. New portacaval upper retroperitoneal adenopathy, suspicious for nodal metastasis. 7. Thoracic surgical and oncology consultation advised for biopsy planning. PET-CT advised for further staging. Electronically Signed   By: Ilona Sorrel M.D.   On: 03/23/2016 22:01   Ct Renal Stone Study  03/23/2016  CLINICAL DATA:  Right-sided flank pain at midnight. Chills. History kidney stones. EXAM: CT ABDOMEN AND PELVIS WITHOUT CONTRAST TECHNIQUE: Multidetector CT imaging of the abdomen and pelvis was performed following the standard protocol without IV contrast. COMPARISON:  02/05/2015 FINDINGS: Lower chest: Normal heart size, without pericardial effusion. Trace right pleural fluid with adjacent dependent atelectasis. New. Hepatobiliary: Subtle hypo attenuating right hepatic lobe low-density lesion of 2.7 cm on image 23/ series 2, new. A probable pericholecystic right hepatic lobe lesion of 1.5 cm on image 25/series 2. Normal gallbladder, without biliary ductal dilatation. Pancreas: Normal, without mass or ductal dilatation. Spleen: Normal in size, without focal abnormality. Adrenals/Urinary Tract: Normal adrenal glands. A calcification the upper pole left kidney is likely associated with a otherwise simple appearing cyst on the prior exam. No right-sided renal calculi or hydronephrosis. No hydroureter or ureteric calculi. No bladder calculi. Stomach/Bowel: Portions of the stomach are underdistended.  No gross abnormality identified. Scattered colonic diverticula. Normal terminal ileum and appendix. Normal small bowel. Vascular/Lymphatic: Aortic atherosclerosis. No abdominopelvic adenopathy. Reproductive: Normal prostate. Other: No significant free fluid. Musculoskeletal: Scattered pelvic bone islands. Mild disc bulge at the lumbosacral junction. IMPRESSION: 1.  No urinary tract calculi or hydronephrosis. 2. Subtle hypo attenuating right hepatic lobe lesion or lesions, likely new since the prior contrast-enhanced CT. Cannot exclude neoplastic process including metastatic disease or primary liver lesion/lesions. Recommend further evaluation with pre and post contrast outpatient abdominal MRI . If hepatic abscess is a concern in this patient with right-sided pain and fevers, contrast-enhanced liver protocol CT could be performed more emergently. 3. Trace right pleural fluid and adjacent atelectasis, new since prior CT. These results were called by telephone at the time of interpretation on 03/23/2016 at 6:56 pm to Dr. Larae Grooms , who verbally acknowledged these results. Electronically Signed   By: Abigail Miyamoto M.D.   On: 03/23/2016 18:58    Scheduled Meds: . famotidine  20 mg Oral BID  . metoprolol tartrate  25 mg Oral BID  . piperacillin-tazobactam (ZOSYN)  IV  3.375 g Intravenous Q8H  . vancomycin  750 mg Intravenous Q8H    Assessment/Plan:  1. Lung mass, liver masses, adrenal mass, lymphadenopathy in the chest. Likely patient has a metastatic lung cancer. I spoke with interventional radiology and they will set up for biopsy on Tuesday. MRI brain cant rule out metastasis 2. Chest Pain -control with IV and oral medications 3. Fever and leukocytosis. Patient initially put on vancomycin and Zosyn. Follow up cultures. 4. Gastroesophageal reflux disease without esophagitis. I gave a GI cocktail and pepcid.    Code Status:     Code Status Orders        Start     Ordered   03/24/16 0344   Full code   Continuous  03/24/16 0343    Code Status History    Date Active Date Inactive Code Status Order ID Comments User Context   This patient has a current code status but no historical code status.     Disposition Plan: TBD  Consultants:  oncology  Antibiotics:  Vancomycin  zosyn  Time spent: 64mnutes  WLoletha Grayer SBig Lots

## 2016-03-24 NOTE — H&P (Addendum)
PCP:  None  Chief Complaint:  Abdominal pain  HPI: This is a 60 year old gentleman who states he is okay until 2 days ago when he developed a sudden onset of abdominal pain. Pain was dull for the most part with intermittent bouts of sharp shooting pain. Pain was located in the right lower abdomen and radiated to the back. At its worse it was 10/10, currently it is 9/10. The patient denies any fever, chills, nausea vomiting or diarrhea. He states he's never had this before. It was severe and he finally came to ER. In the ER imaging reveals evidence of possible metastatic malignancy. The patient denies any weight loss, he states he has a good appetite. He denies any shortness of breath. He states his colonoscopy. The patient does smoke.   Review of Systems:  The patient denies anorexia, fever, weight loss,, vision loss, decreased hearing, hoarseness, chest pain, syncope, dyspnea on exertion, peripheral edema, balance deficits, hemoptysis, abdominal pain, melena, hematochezia, severe indigestion/heartburn, hematuria, incontinence, genital sores, muscle weakness, suspicious skin lesions, transient blindness, difficulty walking, depression, unusual weight change, abnormal bleeding, enlarged lymph nodes, angioedema, and breast masses.  Past Medical History: History reviewed. No pertinent past medical history. Past Surgical History  Procedure Laterality Date  . I&d extremity Right 03/17/2013    Procedure: IRRIGATION AND DEBRIDEMENT EXTREMITY, flexor tendon repair;  Surgeon: Linna Hoff, MD;  Location: [facility];  Service: Orthopedics;  Laterality: Right;  . Knee surgery Bilateral     Medications: Prior to Admission medications   Medication Sig Start Date End Date Taking? Authorizing Provider  acetaminophen (TYLENOL) 325 MG tablet Take 650 mg by mouth every 6 (six) hours as needed.   Yes Historical Provider, MD  ibuprofen (ADVIL,MOTRIN) 200 MG tablet Take 200 mg by mouth every 6 (six) hours as needed.    Yes Historical Provider, MD    Allergies:  No Known Allergies  Social History:  reports that he has been smoking Cigarettes.  He has never used smokeless tobacco. He reports that he drinks about 16.8 oz of alcohol per week. He reports that he does not use illicit drugs.  Family History: Breast and lung cancer  Physical Exam: Filed Vitals:   03/23/16 2300 03/23/16 2330 03/24/16 0015 03/24/16 0030  BP: 140/91 146/99 126/92 128/91  Pulse:  103 102 100  Temp:      TempSrc:      Resp: '15 16 13 25  '$ Height:      Weight:      SpO2:  92% 90% 91%    General:  Alert and oriented times three, well developed and nourished, Uncomfortable appearing patient Eyes: PERRLA, pink conjunctiva, no scleral icterus ENT: Moist oral mucosa, neck supple, no thyromegaly Lungs: clear to ascultation, no wheeze, no crackles, no use of accessory muscles Cardiovascular: regular rate and rhythm, no regurgitation, no gallops, no murmurs. No carotid bruits, no JVD Abdomen: soft, positive BS, non-tender, non-distended, no organomegaly, not an acute abdomen GU: not examined Neuro: CN II - XII grossly intact, sensation intact Musculoskeletal: strength 5/5 all extremities, no clubbing, cyanosis or edema, soft tissue swelling on the right side of the neck Skin: no rash, no subcutaneous crepitation, no decubitus Psych: appropriate patient   Labs on Admission:   Recent Labs  03/23/16 1818  NA 142  K 3.2*  CL 103  CO2 30  GLUCOSE 128*  BUN 7  CREATININE 0.66  CALCIUM 8.4*    Recent Labs  03/23/16 1818  AST 54*  ALT  36  ALKPHOS 75  BILITOT 0.9  PROT 7.2  ALBUMIN 2.8*   No results for input(s): LIPASE, AMYLASE in the last 72 hours.  Recent Labs  03/23/16 1818  WBC 11.3*  NEUTROABS 7.9*  HGB 14.2  HCT 41.5  MCV 86.3  PLT 343   No results for input(s): CKTOTAL, CKMB, CKMBINDEX, TROPONINI in the last 72 hours. Invalid input(s): POCBNP No results for input(s): DDIMER in the last 72  hours. No results for input(s): HGBA1C in the last 72 hours. No results for input(s): CHOL, HDL, LDLCALC, TRIG, CHOLHDL, LDLDIRECT in the last 72 hours. No results for input(s): TSH, T4TOTAL, T3FREE, THYROIDAB in the last 72 hours.  Invalid input(s): FREET3 No results for input(s): VITAMINB12, FOLATE, FERRITIN, TIBC, IRON, RETICCTPCT in the last 72 hours.  Micro Results: No results found for this or any previous visit (from the past 240 hour(s)).   Radiological Exams on Admission: Dg Chest 2 View  03/23/2016  CLINICAL DATA:  Right abdominal/back pain. Right pleural fluid on CT abdomen study from earlier today. EXAM: CHEST  2 VIEW COMPARISON:  03/23/2016 unenhanced CT abdomen/ pelvis. 02/26/2015 chest radiograph. FINDINGS: Normal heart size. There is thickening of the right paratracheal stripe, new. No pneumothorax. Trace basilar right pleural effusion. No left pleural effusion. There is prominent patchy opacity and irregular pleural thickening at the right lung apex, which is new since 02/26/2015. Clear left lung. IMPRESSION: 1. Prominent patchy opacity and irregular pleural thickening at the right lung apex, new since 02/26/2015. Findings are nonspecific with the differential including pulmonary infection or neoplasm. 2. Thickening of the the right paratracheal stripe is new, cannot exclude right paratracheal mediastinal adenopathy. 3. Recommend further evaluation with chest CT with IV contrast. 4. Trace basilar right pleural effusion. Electronically Signed   By: Ilona Sorrel M.D.   On: 03/23/2016 19:42   Ct Chest W Contrast  03/23/2016  CLINICAL DATA:  Indeterminate liver masses detected on unenhanced CT abdomen study from earlier today performed in the setting of right flank pain and chills. New pleural thickening and apical right upper lobe opacity on chest radiograph from earlier today. EXAM: CT CHEST, ABDOMEN, AND PELVIS WITH CONTRAST TECHNIQUE: Multidetector CT imaging of the chest, abdomen  and pelvis was performed following the standard protocol during bolus administration of intravenous contrast. CONTRAST:  18m ISOVUE-300 IOPAMIDOL (ISOVUE-300) INJECTION 61% COMPARISON:  Unenhanced CT abdomen/pelvis from earlier today. Chest radiograph from earlier today. 02/05/2015 CT chest, abdomen and pelvis. FINDINGS: CT CHEST Mediastinum/Nodes: Normal heart size. No pericardial fluid/thickening. Left anterior descending coronary atherosclerosis. Atherosclerotic nonaneurysmal thoracic aorta. Normal caliber pulmonary arteries. No central pulmonary emboli. Normal visualized thyroid. Normal esophagus. No axillary adenopathy. Right hilar adenopathy measuring up to 1.5 cm (series 4/ image 71). No left hilar adenopathy. Bulky right paratracheal adenopathy measuring up to 2.8 cm (series 4/image 50). Lungs/Pleura: No pneumothorax. Trace basilar right pleural effusion. There is an irregular 3.4 x 2.2 cm pleural-based lateral apical right upper lobe lung mass (series 8/image 30), probably representing interval growth of the previously described 0.8 x 0.7 cm apical ground-glass right upper lobe pulmonary nodule on the 02/05/2015 chest CT study. This mass is continuous with extensive circumferential lobular pleural thickening and enhancement in the apical right pleural space, which appears to invade the anterior mediastinum at the level of the proximal aortic arch (series 4/ image 59), and is also continuous with the right paratracheal adenopathy. Patchy ground-glass opacity throughout the parahilar and apical right upper lobe with associated interlobular septal  thickening in this location. Apical left upper lobe pulmonary nodules measuring up to 0.6 cm (series 8/ image 16) are not appreciably changed since 02/05/2015. No additional new significant pulmonary nodules. Musculoskeletal:  No aggressive appearing focal osseous lesions. CT ABDOMEN AND PELVIS Hepatobiliary: At least 3 liver masses scattered in the right liver  lobe, all new since 02/05/2015, demonstrating peripheral hyperenhancement, measuring 3.1 x 2.9 cm in the segment 6 right liver lobe (series 4/ image 154), 1.4 x 1.4 cm in segment 5 right liver lobe (series 4/ image 157) and 1.7 x 1.6 cm in the segment 8 right liver lobe (series 4/ image 128). Normal gallbladder with no radiopaque cholelithiasis. No biliary ductal dilatation. Pancreas: Normal, with no mass or duct dilation. Spleen: Normal size. No mass. Adrenals/Urinary Tract: New 1.2 cm right adrenal nodule (series 4/ image 152). No left adrenal nodule. No hydronephrosis. Simple 1.3 cm upper left renal cyst. Subcentimeter hypodense renal cortical lesion in the medial interpolar left kidney, too small to characterize, unchanged since 02/05/2015, suggesting a benign renal cyst. Normal bladder. Stomach/Bowel: Grossly normal stomach. Normal caliber small bowel with no small bowel wall thickening. Normal appendix. Normal large bowel with no diverticulosis, large bowel wall thickening or pericolonic fat stranding. Vascular/Lymphatic: Atherosclerotic nonaneurysmal abdominal aorta. Patent portal, splenic, hepatic and renal veins. New mildly enlarged 1.5 cm portacaval node (series 4/ image 158). No additional pathologically enlarged abdominopelvic lymph nodes. Reproductive: Normal size prostate. Other: No pneumoperitoneum, ascites or focal fluid collection. Musculoskeletal: No aggressive appearing focal osseous lesions. Mild degenerative changes in the lumbar spine. IMPRESSION: 1. Pleural-based lateral apical right upper lobe irregular 3.4 cm lung mass, probably representing interval growth of the 0.8 cm ground-glass pulmonary nodule described on the 02/05/2015 chest CT study, and highly suspicious for a primary bronchogenic carcinoma. Patchy ground-glass opacity and interlobular septal thickening in the right upper lobe of the lung probably represents alveolar hemorrhage and/or lymphangitic tumor. 2. Extensive irregular  pleural thickening and enhancement in the apical right pleural space worrisome for malignant pleural spread. Trace basilar right pleural effusion. 3. New right hilar and bulky right paratracheal lymphadenopathy, suspicious for nodal metastases. 4. Three new liver masses worrisome for liver metastases. 5. New right adrenal nodule, worrisome for right adrenal metastasis. 6. New portacaval upper retroperitoneal adenopathy, suspicious for nodal metastasis. 7. Thoracic surgical and oncology consultation advised for biopsy planning. PET-CT advised for further staging. Electronically Signed   By: Ilona Sorrel M.D.   On: 03/23/2016 22:01   Ct Abdomen Pelvis W Contrast  03/23/2016  CLINICAL DATA:  Indeterminate liver masses detected on unenhanced CT abdomen study from earlier today performed in the setting of right flank pain and chills. New pleural thickening and apical right upper lobe opacity on chest radiograph from earlier today. EXAM: CT CHEST, ABDOMEN, AND PELVIS WITH CONTRAST TECHNIQUE: Multidetector CT imaging of the chest, abdomen and pelvis was performed following the standard protocol during bolus administration of intravenous contrast. CONTRAST:  145m ISOVUE-300 IOPAMIDOL (ISOVUE-300) INJECTION 61% COMPARISON:  Unenhanced CT abdomen/pelvis from earlier today. Chest radiograph from earlier today. 02/05/2015 CT chest, abdomen and pelvis. FINDINGS: CT CHEST Mediastinum/Nodes: Normal heart size. No pericardial fluid/thickening. Left anterior descending coronary atherosclerosis. Atherosclerotic nonaneurysmal thoracic aorta. Normal caliber pulmonary arteries. No central pulmonary emboli. Normal visualized thyroid. Normal esophagus. No axillary adenopathy. Right hilar adenopathy measuring up to 1.5 cm (series 4/ image 71). No left hilar adenopathy. Bulky right paratracheal adenopathy measuring up to 2.8 cm (series 4/image 50). Lungs/Pleura: No pneumothorax. Trace basilar right  pleural effusion. There is an  irregular 3.4 x 2.2 cm pleural-based lateral apical right upper lobe lung mass (series 8/image 30), probably representing interval growth of the previously described 0.8 x 0.7 cm apical ground-glass right upper lobe pulmonary nodule on the 02/05/2015 chest CT study. This mass is continuous with extensive circumferential lobular pleural thickening and enhancement in the apical right pleural space, which appears to invade the anterior mediastinum at the level of the proximal aortic arch (series 4/ image 59), and is also continuous with the right paratracheal adenopathy. Patchy ground-glass opacity throughout the parahilar and apical right upper lobe with associated interlobular septal thickening in this location. Apical left upper lobe pulmonary nodules measuring up to 0.6 cm (series 8/ image 16) are not appreciably changed since 02/05/2015. No additional new significant pulmonary nodules. Musculoskeletal:  No aggressive appearing focal osseous lesions. CT ABDOMEN AND PELVIS Hepatobiliary: At least 3 liver masses scattered in the right liver lobe, all new since 02/05/2015, demonstrating peripheral hyperenhancement, measuring 3.1 x 2.9 cm in the segment 6 right liver lobe (series 4/ image 154), 1.4 x 1.4 cm in segment 5 right liver lobe (series 4/ image 157) and 1.7 x 1.6 cm in the segment 8 right liver lobe (series 4/ image 128). Normal gallbladder with no radiopaque cholelithiasis. No biliary ductal dilatation. Pancreas: Normal, with no mass or duct dilation. Spleen: Normal size. No mass. Adrenals/Urinary Tract: New 1.2 cm right adrenal nodule (series 4/ image 152). No left adrenal nodule. No hydronephrosis. Simple 1.3 cm upper left renal cyst. Subcentimeter hypodense renal cortical lesion in the medial interpolar left kidney, too small to characterize, unchanged since 02/05/2015, suggesting a benign renal cyst. Normal bladder. Stomach/Bowel: Grossly normal stomach. Normal caliber small bowel with no small bowel wall  thickening. Normal appendix. Normal large bowel with no diverticulosis, large bowel wall thickening or pericolonic fat stranding. Vascular/Lymphatic: Atherosclerotic nonaneurysmal abdominal aorta. Patent portal, splenic, hepatic and renal veins. New mildly enlarged 1.5 cm portacaval node (series 4/ image 158). No additional pathologically enlarged abdominopelvic lymph nodes. Reproductive: Normal size prostate. Other: No pneumoperitoneum, ascites or focal fluid collection. Musculoskeletal: No aggressive appearing focal osseous lesions. Mild degenerative changes in the lumbar spine. IMPRESSION: 1. Pleural-based lateral apical right upper lobe irregular 3.4 cm lung mass, probably representing interval growth of the 0.8 cm ground-glass pulmonary nodule described on the 02/05/2015 chest CT study, and highly suspicious for a primary bronchogenic carcinoma. Patchy ground-glass opacity and interlobular septal thickening in the right upper lobe of the lung probably represents alveolar hemorrhage and/or lymphangitic tumor. 2. Extensive irregular pleural thickening and enhancement in the apical right pleural space worrisome for malignant pleural spread. Trace basilar right pleural effusion. 3. New right hilar and bulky right paratracheal lymphadenopathy, suspicious for nodal metastases. 4. Three new liver masses worrisome for liver metastases. 5. New right adrenal nodule, worrisome for right adrenal metastasis. 6. New portacaval upper retroperitoneal adenopathy, suspicious for nodal metastasis. 7. Thoracic surgical and oncology consultation advised for biopsy planning. PET-CT advised for further staging. Electronically Signed   By: Ilona Sorrel M.D.   On: 03/23/2016 22:01   Ct Renal Stone Study  03/23/2016  CLINICAL DATA:  Right-sided flank pain at midnight. Chills. History kidney stones. EXAM: CT ABDOMEN AND PELVIS WITHOUT CONTRAST TECHNIQUE: Multidetector CT imaging of the abdomen and pelvis was performed following the  standard protocol without IV contrast. COMPARISON:  02/05/2015 FINDINGS: Lower chest: Normal heart size, without pericardial effusion. Trace right pleural fluid with adjacent dependent atelectasis. New. Hepatobiliary:  Subtle hypo attenuating right hepatic lobe low-density lesion of 2.7 cm on image 23/ series 2, new. A probable pericholecystic right hepatic lobe lesion of 1.5 cm on image 25/series 2. Normal gallbladder, without biliary ductal dilatation. Pancreas: Normal, without mass or ductal dilatation. Spleen: Normal in size, without focal abnormality. Adrenals/Urinary Tract: Normal adrenal glands. A calcification the upper pole left kidney is likely associated with a otherwise simple appearing cyst on the prior exam. No right-sided renal calculi or hydronephrosis. No hydroureter or ureteric calculi. No bladder calculi. Stomach/Bowel: Portions of the stomach are underdistended. No gross abnormality identified. Scattered colonic diverticula. Normal terminal ileum and appendix. Normal small bowel. Vascular/Lymphatic: Aortic atherosclerosis. No abdominopelvic adenopathy. Reproductive: Normal prostate. Other: No significant free fluid. Musculoskeletal: Scattered pelvic bone islands. Mild disc bulge at the lumbosacral junction. IMPRESSION: 1.  No urinary tract calculi or hydronephrosis. 2. Subtle hypo attenuating right hepatic lobe lesion or lesions, likely new since the prior contrast-enhanced CT. Cannot exclude neoplastic process including metastatic disease or primary liver lesion/lesions. Recommend further evaluation with pre and post contrast outpatient abdominal MRI . If hepatic abscess is a concern in this patient with right-sided pain and fevers, contrast-enhanced liver protocol CT could be performed more emergently. 3. Trace right pleural fluid and adjacent atelectasis, new since prior CT. These results were called by telephone at the time of interpretation on 03/23/2016 at 6:56 pm to Dr. Larae Grooms , who  verbally acknowledged these results. Electronically Signed   By: Abigail Miyamoto M.D.   On: 03/23/2016 18:58    Assessment/Plan Present on Admission:  . Likely lung cancer [facility]) with metastase s  -Admit to [place] intervention[facility] radiology in a.m. for biopsy -Dilaudid when necessary for pain control  . Tobacco abuse -Nicotine patch, oxygen and doing nebs as needed  Fever Tmax 102.3 -No clear evidence of cause. Blood cultures and urine cultures collected -Will continue empiric Rocephin for now and await cultures  . HTN (hypertension) uncontrolled -Lopressor when necessary, metoprolol scheduled 25 mg twice a day  Mild hypokalemia -Repeat IV  Markise Haymer 03/24/2016, 1:48 AM

## 2016-03-24 NOTE — Progress Notes (Signed)
Dr. Estanislado Pandy notified of patient's lactic acid ordered q3hr and potassium level this am is 2.0. New orders received for 40 meq potassium PO once now and may discontinue q3hr lactic acid.

## 2016-03-24 NOTE — Consult Note (Signed)
[place]  [phone] [phone]) [phone]  ID: Tilda Burrow OB: 1956/09/07  MR#: [id number]  GDJ#:[id number]  Patient Care Team: Provider Default, MD as PCP - General  CHIEF COMPLAINT:  Chief Complaint  Patient presents with  . Flank Pain    INTERVAL HISTORY: Patient is a 60 year old male who was in his usual state of health until approximately 2 days ago. Acute onset of severe right flank pain. Patient states the pain was 10 out of 10. Subsequent evaluation in the emergency room revealed a lung mass with likely liver metastasis concerning for underlying malignancy. Patient states his pain is better controlled, but still evident. He offers no other complaints. He has no neurologic complaints. He denies any recent fevers or illnesses. He has a good appetite and denies weight loss. He denies any other pain. He denies any chest pain, cough, shortness of breath, or hemoptysis. He denies any nausea, vomiting, constipation, or diarrhea. He has no urinary complaints. Patient otherwise feels well and offers no further specific complaints.  REVIEW OF SYSTEMS:   Review of Systems  Constitutional: Negative.  Negative for fever, weight loss and malaise/fatigue.  Respiratory: Negative.  Negative for cough, hemoptysis and shortness of breath.   Cardiovascular: Positive for chest pain. Negative for leg swelling.  Gastrointestinal: Positive for nausea. Negative for vomiting and abdominal pain.  Genitourinary: Negative.   Musculoskeletal: Negative.   Neurological: Negative.  Negative for weakness.  Psychiatric/Behavioral: Negative.     As per HPI. Otherwise, a complete review of systems is negatve.  PAST MEDICAL HISTORY: History reviewed. No pertinent past medical history.  PAST SURGICAL HISTORY: Past Surgical History  Procedure Laterality Date  . I&d extremity Right 03/17/2013    Procedure: IRRIGATION AND DEBRIDEMENT EXTREMITY, flexor tendon repair;  Surgeon:  Linna Hoff, MD;  Location: [facility];  Service: Orthopedics;  Laterality: Right;  . Knee surgery Bilateral     FAMILY HISTORY History reviewed. No pertinent family history.     ADVANCED DIRECTIVES:    HEALTH MAINTENANCE: Social History  Substance Use Topics  . Smoking status: Current Some Day Smoker    Types: Cigarettes  . Smokeless tobacco: Never Used  . Alcohol Use: 16.8 oz/week    28 Cans of beer per week     Colonoscopy:  PAP:  Bone density:  Lipid panel:  No Known Allergies  Current Facility-Administered Medications  Medication Dose Route Frequency Provider Last Rate Last Dose  . acetaminophen (TYLENOL) tablet 650 mg  650 mg Oral Q6H PRN Debby Crosley, MD   650 mg at 03/24/16 0543   Or  . acetaminophen (TYLENOL) suppository 650 mg  650 mg Rectal Q6H PRN Debby Crosley, MD      . bisacodyl (DULCOLAX) EC tablet 5 mg  5 mg Oral Daily PRN Debby Crosley, MD      . famotidine (PEPCID) tablet 20 mg  20 mg Oral BID Loletha Grayer, MD   20 mg at 03/24/16 0956  . HYDROmorphone (DILAUDID) injection 2 mg  2 mg Intravenous Q4H PRN Quintella Baton, MD   2 mg at 03/24/16 0943  . metoprolol (LOPRESSOR) injection 5 mg  5 mg Intravenous Q6H PRN Debby Crosley, MD      . metoprolol tartrate (LOPRESSOR) tablet 25 mg  25 mg Oral BID Debby Crosley, MD   25 mg at 03/24/16 0956  . ondansetron (ZOFRAN) injection 4 mg  4 mg Intravenous Q6H PRN Loletha Grayer, MD   4 mg at 03/24/16 1233  .  oxyCODONE (Oxy IR/ROXICODONE) immediate release tablet 5 mg  5 mg Oral Q4H PRN Loletha Grayer, MD      . piperacillin-tazobactam (ZOSYN) IVPB 3.375 g  3.375 g Intravenous Q8H Orbie Pyo, MD 12.5 mL/hr at 03/24/16 0956 3.375 g at 03/24/16 0956  . vancomycin (VANCOCIN) IVPB 750 mg/150 ml premix  750 mg Intravenous Q8H Orbie Pyo, MD   750 mg at 03/24/16 0610    OBJECTIVE: Filed Vitals:   03/24/16 0955 03/24/16 1341  BP: 104/65 133/90  Pulse: 97 91  Temp: 99 F (37.2 C) 98.3 F  (36.8 C)  Resp: 20 18     Body mass index is 21.83 kg/(m^2).    ECOG FS:0 - Asymptomatic  General: Well-developed, well-nourished, no acute distress. Eyes: Pink conjunctiva, anicteric sclera. HEENT: Normocephalic, moist mucous membranes, clear oropharnyx. Lungs: Clear to auscultation bilaterally. Heart: Regular rate and rhythm. No rubs, murmurs, or gallops. Abdomen: Soft, nontender, nondistended. No organomegaly noted, normoactive bowel sounds. Musculoskeletal: No edema, cyanosis, or clubbing. Neuro: Alert, answering all questions appropriately. Cranial nerves grossly intact. Skin: No rashes or petechiae noted. Psych: Normal affect. Lymphatics: No cervical, calvicular, axillary or inguinal LAD.   LAB RESULTS:  Lab Results  Component Value Date   NA 136 03/24/2016   K 3.0* 03/24/2016   CL 102 03/24/2016   CO2 25 03/24/2016   GLUCOSE 86 03/24/2016   BUN 5* 03/24/2016   CREATININE 0.69 03/24/2016   CALCIUM 7.6* 03/24/2016   PROT 7.2 03/23/2016   ALBUMIN 2.8* 03/23/2016   AST 54* 03/23/2016   ALT 36 03/23/2016   ALKPHOS 75 03/23/2016   BILITOT 0.9 03/23/2016   GFRNONAA >60 03/24/2016   GFRAA >60 03/24/2016    Lab Results  Component Value Date   WBC 13.1* 03/24/2016   NEUTROABS 7.9* 03/23/2016   HGB 12.7* 03/24/2016   HCT 37.3* 03/24/2016   MCV 87.6 03/24/2016   PLT 291 03/24/2016     STUDIES: Dg Chest 2 View  03/23/2016  CLINICAL DATA:  Right abdominal/back pain. Right pleural fluid on CT abdomen study from earlier today. EXAM: CHEST  2 VIEW COMPARISON:  03/23/2016 unenhanced CT abdomen/ pelvis. 02/26/2015 chest radiograph. FINDINGS: Normal heart size. There is thickening of the right paratracheal stripe, new. No pneumothorax. Trace basilar right pleural effusion. No left pleural effusion. There is prominent patchy opacity and irregular pleural thickening at the right lung apex, which is new since 02/26/2015. Clear left lung. IMPRESSION: 1. Prominent patchy opacity  and irregular pleural thickening at the right lung apex, new since 02/26/2015. Findings are nonspecific with the differential including pulmonary infection or neoplasm. 2. Thickening of the the right paratracheal stripe is new, cannot exclude right paratracheal mediastinal adenopathy. 3. Recommend further evaluation with chest CT with IV contrast. 4. Trace basilar right pleural effusion. Electronically Signed   By: Ilona Sorrel M.D.   On: 03/23/2016 19:42   Ct Chest W Contrast  03/23/2016  CLINICAL DATA:  Indeterminate liver masses detected on unenhanced CT abdomen study from earlier today performed in the setting of right flank pain and chills. New pleural thickening and apical right upper lobe opacity on chest radiograph from earlier today. EXAM: CT CHEST, ABDOMEN, AND PELVIS WITH CONTRAST TECHNIQUE: Multidetector CT imaging of the chest, abdomen and pelvis was performed following the standard protocol during bolus administration of intravenous contrast. CONTRAST:  195m ISOVUE-300 IOPAMIDOL (ISOVUE-300) INJECTION 61% COMPARISON:  Unenhanced CT abdomen/pelvis from earlier today. Chest radiograph from earlier today. 02/05/2015 CT chest, abdomen and  pelvis. FINDINGS: CT CHEST Mediastinum/Nodes: Normal heart size. No pericardial fluid/thickening. Left anterior descending coronary atherosclerosis. Atherosclerotic nonaneurysmal thoracic aorta. Normal caliber pulmonary arteries. No central pulmonary emboli. Normal visualized thyroid. Normal esophagus. No axillary adenopathy. Right hilar adenopathy measuring up to 1.5 cm (series 4/ image 71). No left hilar adenopathy. Bulky right paratracheal adenopathy measuring up to 2.8 cm (series 4/image 50). Lungs/Pleura: No pneumothorax. Trace basilar right pleural effusion. There is an irregular 3.4 x 2.2 cm pleural-based lateral apical right upper lobe lung mass (series 8/image 30), probably representing interval growth of the previously described 0.8 x 0.7 cm apical  ground-glass right upper lobe pulmonary nodule on the 02/05/2015 chest CT study. This mass is continuous with extensive circumferential lobular pleural thickening and enhancement in the apical right pleural space, which appears to invade the anterior mediastinum at the level of the proximal aortic arch (series 4/ image 59), and is also continuous with the right paratracheal adenopathy. Patchy ground-glass opacity throughout the parahilar and apical right upper lobe with associated interlobular septal thickening in this location. Apical left upper lobe pulmonary nodules measuring up to 0.6 cm (series 8/ image 16) are not appreciably changed since 02/05/2015. No additional new significant pulmonary nodules. Musculoskeletal:  No aggressive appearing focal osseous lesions. CT ABDOMEN AND PELVIS Hepatobiliary: At least 3 liver masses scattered in the right liver lobe, all new since 02/05/2015, demonstrating peripheral hyperenhancement, measuring 3.1 x 2.9 cm in the segment 6 right liver lobe (series 4/ image 154), 1.4 x 1.4 cm in segment 5 right liver lobe (series 4/ image 157) and 1.7 x 1.6 cm in the segment 8 right liver lobe (series 4/ image 128). Normal gallbladder with no radiopaque cholelithiasis. No biliary ductal dilatation. Pancreas: Normal, with no mass or duct dilation. Spleen: Normal size. No mass. Adrenals/Urinary Tract: New 1.2 cm right adrenal nodule (series 4/ image 152). No left adrenal nodule. No hydronephrosis. Simple 1.3 cm upper left renal cyst. Subcentimeter hypodense renal cortical lesion in the medial interpolar left kidney, too small to characterize, unchanged since 02/05/2015, suggesting a benign renal cyst. Normal bladder. Stomach/Bowel: Grossly normal stomach. Normal caliber small bowel with no small bowel wall thickening. Normal appendix. Normal large bowel with no diverticulosis, large bowel wall thickening or pericolonic fat stranding. Vascular/Lymphatic: Atherosclerotic nonaneurysmal  abdominal aorta. Patent portal, splenic, hepatic and renal veins. New mildly enlarged 1.5 cm portacaval node (series 4/ image 158). No additional pathologically enlarged abdominopelvic lymph nodes. Reproductive: Normal size prostate. Other: No pneumoperitoneum, ascites or focal fluid collection. Musculoskeletal: No aggressive appearing focal osseous lesions. Mild degenerative changes in the lumbar spine. IMPRESSION: 1. Pleural-based lateral apical right upper lobe irregular 3.4 cm lung mass, probably representing interval growth of the 0.8 cm ground-glass pulmonary nodule described on the 02/05/2015 chest CT study, and highly suspicious for a primary bronchogenic carcinoma. Patchy ground-glass opacity and interlobular septal thickening in the right upper lobe of the lung probably represents alveolar hemorrhage and/or lymphangitic tumor. 2. Extensive irregular pleural thickening and enhancement in the apical right pleural space worrisome for malignant pleural spread. Trace basilar right pleural effusion. 3. New right hilar and bulky right paratracheal lymphadenopathy, suspicious for nodal metastases. 4. Three new liver masses worrisome for liver metastases. 5. New right adrenal nodule, worrisome for right adrenal metastasis. 6. New portacaval upper retroperitoneal adenopathy, suspicious for nodal metastasis. 7. Thoracic surgical and oncology consultation advised for biopsy planning. PET-CT advised for further staging. Electronically Signed   By: Ilona Sorrel M.D.   On: 03/23/2016  22:01   Mr Jeri Cos TI Contrast  03/24/2016  ADDENDUM REPORT: 03/24/2016 13:03 ADDENDUM: Tiny region of blood breakdown products left parietal lobe may reflect result of prior hemorrhagic ischemia. Electronically Signed   By: Genia Del M.D.   On: 03/24/2016 13:03  03/24/2016  CLINICAL DATA:  60 year old male with lung cancer. Staging. Initial encounter. EXAM: MRI HEAD WITHOUT AND WITH CONTRAST TECHNIQUE: Multiplanar, multiecho pulse  sequences of the brain and surrounding structures were obtained without and with intravenous contrast. CONTRAST:  34m MULTIHANCE GADOBENATE DIMEGLUMINE 529 MG/ML IV SOLN COMPARISON:  02/05/2015 head CT. FINDINGS: Exam is motion degraded. No discrete parenchymal enhancing lesion however, on diffusion sequence punctate areas of restricted motion medial right parietal lobe, left frontal lobe and the right cerebellum raises possibility of small nonenhancing intracranial metastatic lesions. Heterogeneous bone marrow in a diffuse fashion without focal osseous destructive lesion. No acute infarct or intracranial hemorrhage. Mild chronic small vessel disease changes. Global atrophy without hydrocephalus. Major intracranial vascular structures are patent. Complete opacification left maxillary sinus with small size suggests and result of chronic obstruction. Diminutive size pituitary gland. No worrisome orbital abnormality noted. IMPRESSION: Exam is motion degraded. No discrete parenchymal enhancing lesion however, on diffusion sequence punctate areas of restricted motion medial right parietal lobe, left frontal lobe and the right cerebellum raises possibility of small nonenhancing intracranial metastatic lesions. Heterogeneous bone marrow in a diffuse fashion without focal osseous destructive lesion. Mild chronic small vessel disease changes. Global atrophy without hydrocephalus. Complete opacification left maxillary sinus with small size suggests and result of chronic obstruction. Electronically Signed: By: SGenia DelM.D. On: 03/24/2016 12:50   Ct Abdomen Pelvis W Contrast  03/23/2016  CLINICAL DATA:  Indeterminate liver masses detected on unenhanced CT abdomen study from earlier today performed in the setting of right flank pain and chills. New pleural thickening and apical right upper lobe opacity on chest radiograph from earlier today. EXAM: CT CHEST, ABDOMEN, AND PELVIS WITH CONTRAST TECHNIQUE: Multidetector CT  imaging of the chest, abdomen and pelvis was performed following the standard protocol during bolus administration of intravenous contrast. CONTRAST:  1079mISOVUE-300 IOPAMIDOL (ISOVUE-300) INJECTION 61% COMPARISON:  Unenhanced CT abdomen/pelvis from earlier today. Chest radiograph from earlier today. 02/05/2015 CT chest, abdomen and pelvis. FINDINGS: CT CHEST Mediastinum/Nodes: Normal heart size. No pericardial fluid/thickening. Left anterior descending coronary atherosclerosis. Atherosclerotic nonaneurysmal thoracic aorta. Normal caliber pulmonary arteries. No central pulmonary emboli. Normal visualized thyroid. Normal esophagus. No axillary adenopathy. Right hilar adenopathy measuring up to 1.5 cm (series 4/ image 71). No left hilar adenopathy. Bulky right paratracheal adenopathy measuring up to 2.8 cm (series 4/image 50). Lungs/Pleura: No pneumothorax. Trace basilar right pleural effusion. There is an irregular 3.4 x 2.2 cm pleural-based lateral apical right upper lobe lung mass (series 8/image 30), probably representing interval growth of the previously described 0.8 x 0.7 cm apical ground-glass right upper lobe pulmonary nodule on the 02/05/2015 chest CT study. This mass is continuous with extensive circumferential lobular pleural thickening and enhancement in the apical right pleural space, which appears to invade the anterior mediastinum at the level of the proximal aortic arch (series 4/ image 59), and is also continuous with the right paratracheal adenopathy. Patchy ground-glass opacity throughout the parahilar and apical right upper lobe with associated interlobular septal thickening in this location. Apical left upper lobe pulmonary nodules measuring up to 0.6 cm (series 8/ image 16) are not appreciably changed since 02/05/2015. No additional new significant pulmonary nodules. Musculoskeletal:  No aggressive appearing  focal osseous lesions. CT ABDOMEN AND PELVIS Hepatobiliary: At least 3 liver masses  scattered in the right liver lobe, all new since 02/05/2015, demonstrating peripheral hyperenhancement, measuring 3.1 x 2.9 cm in the segment 6 right liver lobe (series 4/ image 154), 1.4 x 1.4 cm in segment 5 right liver lobe (series 4/ image 157) and 1.7 x 1.6 cm in the segment 8 right liver lobe (series 4/ image 128). Normal gallbladder with no radiopaque cholelithiasis. No biliary ductal dilatation. Pancreas: Normal, with no mass or duct dilation. Spleen: Normal size. No mass. Adrenals/Urinary Tract: New 1.2 cm right adrenal nodule (series 4/ image 152). No left adrenal nodule. No hydronephrosis. Simple 1.3 cm upper left renal cyst. Subcentimeter hypodense renal cortical lesion in the medial interpolar left kidney, too small to characterize, unchanged since 02/05/2015, suggesting a benign renal cyst. Normal bladder. Stomach/Bowel: Grossly normal stomach. Normal caliber small bowel with no small bowel wall thickening. Normal appendix. Normal large bowel with no diverticulosis, large bowel wall thickening or pericolonic fat stranding. Vascular/Lymphatic: Atherosclerotic nonaneurysmal abdominal aorta. Patent portal, splenic, hepatic and renal veins. New mildly enlarged 1.5 cm portacaval node (series 4/ image 158). No additional pathologically enlarged abdominopelvic lymph nodes. Reproductive: Normal size prostate. Other: No pneumoperitoneum, ascites or focal fluid collection. Musculoskeletal: No aggressive appearing focal osseous lesions. Mild degenerative changes in the lumbar spine. IMPRESSION: 1. Pleural-based lateral apical right upper lobe irregular 3.4 cm lung mass, probably representing interval growth of the 0.8 cm ground-glass pulmonary nodule described on the 02/05/2015 chest CT study, and highly suspicious for a primary bronchogenic carcinoma. Patchy ground-glass opacity and interlobular septal thickening in the right upper lobe of the lung probably represents alveolar hemorrhage and/or lymphangitic  tumor. 2. Extensive irregular pleural thickening and enhancement in the apical right pleural space worrisome for malignant pleural spread. Trace basilar right pleural effusion. 3. New right hilar and bulky right paratracheal lymphadenopathy, suspicious for nodal metastases. 4. Three new liver masses worrisome for liver metastases. 5. New right adrenal nodule, worrisome for right adrenal metastasis. 6. New portacaval upper retroperitoneal adenopathy, suspicious for nodal metastasis. 7. Thoracic surgical and oncology consultation advised for biopsy planning. PET-CT advised for further staging. Electronically Signed   By: Ilona Sorrel M.D.   On: 03/23/2016 22:01   Ct Renal Stone Study  03/23/2016  CLINICAL DATA:  Right-sided flank pain at midnight. Chills. History kidney stones. EXAM: CT ABDOMEN AND PELVIS WITHOUT CONTRAST TECHNIQUE: Multidetector CT imaging of the abdomen and pelvis was performed following the standard protocol without IV contrast. COMPARISON:  02/05/2015 FINDINGS: Lower chest: Normal heart size, without pericardial effusion. Trace right pleural fluid with adjacent dependent atelectasis. New. Hepatobiliary: Subtle hypo attenuating right hepatic lobe low-density lesion of 2.7 cm on image 23/ series 2, new. A probable pericholecystic right hepatic lobe lesion of 1.5 cm on image 25/series 2. Normal gallbladder, without biliary ductal dilatation. Pancreas: Normal, without mass or ductal dilatation. Spleen: Normal in size, without focal abnormality. Adrenals/Urinary Tract: Normal adrenal glands. A calcification the upper pole left kidney is likely associated with a otherwise simple appearing cyst on the prior exam. No right-sided renal calculi or hydronephrosis. No hydroureter or ureteric calculi. No bladder calculi. Stomach/Bowel: Portions of the stomach are underdistended. No gross abnormality identified. Scattered colonic diverticula. Normal terminal ileum and appendix. Normal small bowel.  Vascular/Lymphatic: Aortic atherosclerosis. No abdominopelvic adenopathy. Reproductive: Normal prostate. Other: No significant free fluid. Musculoskeletal: Scattered pelvic bone islands. Mild disc bulge at the lumbosacral junction. IMPRESSION: 1.  No urinary  tract calculi or hydronephrosis. 2. Subtle hypo attenuating right hepatic lobe lesion or lesions, likely new since the prior contrast-enhanced CT. Cannot exclude neoplastic process including metastatic disease or primary liver lesion/lesions. Recommend further evaluation with pre and post contrast outpatient abdominal MRI . If hepatic abscess is a concern in this patient with right-sided pain and fevers, contrast-enhanced liver protocol CT could be performed more emergently. 3. Trace right pleural fluid and adjacent atelectasis, new since prior CT. These results were called by telephone at the time of interpretation on 03/23/2016 at 6:56 pm to Dr. Larae Grooms , who verbally acknowledged these results. Electronically Signed   By: Abigail Miyamoto M.D.   On: 03/23/2016 18:58    ASSESSMENT: Lung mass with suspicious liver lesions concerning for stage IV lung cancer.  PLAN:    1. Lung mass/liver lesions: Highly suspicious for malignancy. CT guided biopsy of liver has been scheduled, although ultrasound-guided biopsy or possibly EUS may also yield results. Consider pulmonary consult.  MRI the brain is negative. Once pathology results are finalized, patient will require a PET scan as an outpatient to complete the staging workup. He should follow-up in the [facility] within 1-2 weeks to discuss the results, additional diagnostic planning, and treatment planning. 2. Pain: Likely secondary to underlying malignancy: Continue current narcotic regimen. Patient will require transition to oral regimen prior to discharge. Follow-up in the [facility] as above. 3. Nausea: Likely secondary to narcotics, continue antiemetics as prescribed.  Appreciate consult, will  follow.   Lloyd Huger, MD   03/24/2016 1:49 PM

## 2016-03-25 LAB — URINE CULTURE

## 2016-03-25 LAB — CBC
HEMATOCRIT: 34.6 % — AB (ref 40.0–52.0)
Hemoglobin: 12.3 g/dL — ABNORMAL LOW (ref 13.0–18.0)
MCH: 30.5 pg (ref 26.0–34.0)
MCHC: 35.4 g/dL (ref 32.0–36.0)
MCV: 86.3 fL (ref 80.0–100.0)
Platelets: 290 10*3/uL (ref 150–440)
RBC: 4.01 MIL/uL — ABNORMAL LOW (ref 4.40–5.90)
RDW: 13.4 % (ref 11.5–14.5)
WBC: 10.5 10*3/uL (ref 3.8–10.6)

## 2016-03-25 LAB — BASIC METABOLIC PANEL
Anion gap: 5 (ref 5–15)
BUN: 7 mg/dL (ref 6–20)
CHLORIDE: 99 mmol/L — AB (ref 101–111)
CO2: 28 mmol/L (ref 22–32)
Calcium: 8 mg/dL — ABNORMAL LOW (ref 8.9–10.3)
Creatinine, Ser: 0.6 mg/dL — ABNORMAL LOW (ref 0.61–1.24)
GFR calc non Af Amer: >60 mL/min (ref >60)
Glucose, Bld: 92 mg/dL (ref 65–99)
POTASSIUM: 3.3 mmol/L — AB (ref 3.5–5.1)
SODIUM: 132 mmol/L — AB (ref 135–145)

## 2016-03-25 LAB — VANCOMYCIN, TROUGH: VANCOMYCIN TR: 10 ug/mL (ref 10–20)

## 2016-03-25 MED ORDER — POTASSIUM CHLORIDE CRYS ER 20 MEQ PO TBCR
40.0000 meq | EXTENDED_RELEASE_TABLET | Freq: Once | ORAL | Status: AC
  Administered 2016-03-25: 40 meq via ORAL
  Filled 2016-03-25: qty 2

## 2016-03-25 MED ORDER — MORPHINE SULFATE ER 15 MG PO TBCR
15.0000 mg | EXTENDED_RELEASE_TABLET | Freq: Two times a day (BID) | ORAL | Status: DC
  Administered 2016-03-25 – 2016-03-27 (×4): 15 mg via ORAL
  Filled 2016-03-25 (×4): qty 1

## 2016-03-25 MED ORDER — CYCLOBENZAPRINE HCL 10 MG PO TABS: 5.0000 mg | ORAL_TABLET | Freq: Three times a day (TID) | ORAL | Status: DC | PRN

## 2016-03-25 MED ORDER — VANCOMYCIN HCL IN DEXTROSE 1-5 GM/200ML-% IV SOLN
1000.0000 mg | Freq: Three times a day (TID) | INTRAVENOUS | Status: DC
  Administered 2016-03-25 – 2016-03-26 (×3): 1000 mg via INTRAVENOUS
  Filled 2016-03-25 (×5): qty 200

## 2016-03-25 NOTE — Progress Notes (Signed)
Patient ID: Dennis Perkins, male   DOB: 1956-06-23, 60 y.o.   MRN: [id number] [name]:[id number] DOB: Mar 06, 1956 DOA: 03/23/2016 PCP: Default, Provider, MD  HPI/Subjective: Patient still requiring IV dye loaded for pain control. I advised him to try to come to oral medications. Patient states that his pain has moved from his right back a little bit lower now. Still with some cough and shortness of breath.  Objective: Filed Vitals:   03/25/16 0627 03/25/16 1248  BP:  121/83  Pulse:  93  Temp: 99.5 F (37.5 C) 99.6 F (37.6 C)  Resp:  20    Filed Weights   03/23/16 1755 03/24/16 0338  Weight: 68.947 kg (152 lb) 67.087 kg (147 lb 14.4 oz)    ROS: Review of Systems  Constitutional: Positive for fever. Negative for chills.  Eyes: Negative for blurred vision.  Respiratory: Positive for cough and shortness of breath.   Cardiovascular: Positive for chest pain.  Gastrointestinal: Positive for nausea. Negative for vomiting, abdominal pain, diarrhea and constipation.  Genitourinary: Negative for dysuria.  Musculoskeletal: Negative for joint pain.  Neurological: Negative for dizziness and headaches.   Exam: Physical Exam  Constitutional: He is oriented to person, place, and time.  HENT:  Nose: No mucosal edema.  Mouth/Throat: No oropharyngeal exudate or posterior oropharyngeal edema.  Eyes: Conjunctivae, EOM and lids are normal. Pupils are equal, round, and reactive to light.  Neck: No JVD present. Carotid bruit is not present. No edema present. No thyroid mass and no thyromegaly present.  Cardiovascular: S1 normal and S2 normal.  Exam reveals no gallop.   No murmur heard. Pulses:      Dorsalis pedis pulses are 2+ on the right side, and 2+ on the left side.  Respiratory: No respiratory distress. He has no wheezes. He has rhonchi in the right lower field. He has no rales.  GI: Soft. Bowel sounds are normal. There is no tenderness.   Musculoskeletal:       Right ankle: He exhibits no swelling.       Left ankle: He exhibits no swelling.  Lymphadenopathy:    He has no cervical adenopathy.  Neurological: He is alert and oriented to person, place, and time. No cranial nerve deficit.  Skin: Skin is warm. No rash noted. Nails show no clubbing.  Psychiatric: He has a normal mood and affect.      Data Reviewed: Basic Metabolic Panel:  Recent Labs Lab 03/23/16 1818 03/24/16 0442 03/25/16 0525  NA 142 136 132*  K 3.2* 3.0* 3.3*  CL 103 102 99*  CO2 '30 25 28  '$ GLUCOSE 128* 86 92  BUN 7 5* 7  CREATININE 0.66 0.69 0.60*  CALCIUM 8.4* 7.6* 8.0*   Liver Function Tests:  Recent Labs Lab 03/23/16 1818  AST 54*  ALT 36  ALKPHOS 75  BILITOT 0.9  PROT 7.2  ALBUMIN 2.8*   CBC:  Recent Labs Lab 03/23/16 1818 03/24/16 0442 03/25/16 0525  WBC 11.3* 13.1* 10.5  NEUTROABS 7.9*  --   --   HGB 14.2 12.7* 12.3*  HCT 41.5 37.3* 34.6*  MCV 86.3 87.6 86.3  PLT 343 291 290    Recent Results (from the past 240 hour(s))  Blood Culture (routine x 2)     Status: None (Preliminary result)   Collection Time: 03/23/16  6:18 PM  Result Value Ref Range Status   Specimen Description BLOOD RIGHT ANTECUBITAL  Final   Special Requests  Final    BOTTLES DRAWN AEROBIC AND ANAEROBIC  AER 10CC ANA 6CC   Culture NO GROWTH 2 DAYS  Final   Report Status PENDING  Incomplete  Blood Culture (routine x 2)     Status: None (Preliminary result)   Collection Time: 03/23/16  6:18 PM  Result Value Ref Range Status   Specimen Description BLOOD LEFT ANTECUBITAL  Final   Special Requests BOTTLES DRAWN AEROBIC AND ANAEROBIC  8CC  Final   Culture NO GROWTH 2 DAYS  Final   Report Status PENDING  Incomplete  Urine culture     Status: Abnormal   Collection Time: 03/23/16  7:15 PM  Result Value Ref Range Status   Specimen Description URINE, RANDOM  Final   Special Requests NONE  Final   Culture (A)  Final    <10,000 COLONIES/mL  INSIGNIFICANT GROWTH Performed at [facility]    Report Status 03/25/2016 FINAL  Final     Studies: Dg Chest 2 View  03/23/2016  CLINICAL DATA:  Right abdominal/back pain. Right pleural fluid on CT abdomen study from earlier today. EXAM: CHEST  2 VIEW COMPARISON:  03/23/2016 unenhanced CT abdomen/ pelvis. 02/26/2015 chest radiograph. FINDINGS: Normal heart size. There is thickening of the right paratracheal stripe, new. No pneumothorax. Trace basilar right pleural effusion. No left pleural effusion. There is prominent patchy opacity and irregular pleural thickening at the right lung apex, which is new since 02/26/2015. Clear left lung. IMPRESSION: 1. Prominent patchy opacity and irregular pleural thickening at the right lung apex, new since 02/26/2015. Findings are nonspecific with the differential including pulmonary infection or neoplasm. 2. Thickening of the the right paratracheal stripe is new, cannot exclude right paratracheal mediastinal adenopathy. 3. Recommend further evaluation with chest CT with IV contrast. 4. Trace basilar right pleural effusion. Electronically Signed   By: Ilona Sorrel M.D.   On: 03/23/2016 19:42   Ct Chest W Contrast  03/23/2016  CLINICAL DATA:  Indeterminate liver masses detected on unenhanced CT abdomen study from earlier today performed in the setting of right flank pain and chills. New pleural thickening and apical right upper lobe opacity on chest radiograph from earlier today. EXAM: CT CHEST, ABDOMEN, AND PELVIS WITH CONTRAST TECHNIQUE: Multidetector CT imaging of the chest, abdomen and pelvis was performed following the standard protocol during bolus administration of intravenous contrast. CONTRAST:  137m ISOVUE-300 IOPAMIDOL (ISOVUE-300) INJECTION 61% COMPARISON:  Unenhanced CT abdomen/pelvis from earlier today. Chest radiograph from earlier today. 02/05/2015 CT chest, abdomen and pelvis. FINDINGS: CT CHEST Mediastinum/Nodes: Normal heart size. No  pericardial fluid/thickening. Left anterior descending coronary atherosclerosis. Atherosclerotic nonaneurysmal thoracic aorta. Normal caliber pulmonary arteries. No central pulmonary emboli. Normal visualized thyroid. Normal esophagus. No axillary adenopathy. Right hilar adenopathy measuring up to 1.5 cm (series 4/ image 71). No left hilar adenopathy. Bulky right paratracheal adenopathy measuring up to 2.8 cm (series 4/image 50). Lungs/Pleura: No pneumothorax. Trace basilar right pleural effusion. There is an irregular 3.4 x 2.2 cm pleural-based lateral apical right upper lobe lung mass (series 8/image 30), probably representing interval growth of the previously described 0.8 x 0.7 cm apical ground-glass right upper lobe pulmonary nodule on the 02/05/2015 chest CT study. This mass is continuous with extensive circumferential lobular pleural thickening and enhancement in the apical right pleural space, which appears to invade the anterior mediastinum at the level of the proximal aortic arch (series 4/ image 59), and is also continuous with the right paratracheal adenopathy. Patchy ground-glass opacity throughout the parahilar and  apical right upper lobe with associated interlobular septal thickening in this location. Apical left upper lobe pulmonary nodules measuring up to 0.6 cm (series 8/ image 16) are not appreciably changed since 02/05/2015. No additional new significant pulmonary nodules. Musculoskeletal:  No aggressive appearing focal osseous lesions. CT ABDOMEN AND PELVIS Hepatobiliary: At least 3 liver masses scattered in the right liver lobe, all new since 02/05/2015, demonstrating peripheral hyperenhancement, measuring 3.1 x 2.9 cm in the segment 6 right liver lobe (series 4/ image 154), 1.4 x 1.4 cm in segment 5 right liver lobe (series 4/ image 157) and 1.7 x 1.6 cm in the segment 8 right liver lobe (series 4/ image 128). Normal gallbladder with no radiopaque cholelithiasis. No biliary ductal dilatation.  Pancreas: Normal, with no mass or duct dilation. Spleen: Normal size. No mass. Adrenals/Urinary Tract: New 1.2 cm right adrenal nodule (series 4/ image 152). No left adrenal nodule. No hydronephrosis. Simple 1.3 cm upper left renal cyst. Subcentimeter hypodense renal cortical lesion in the medial interpolar left kidney, too small to characterize, unchanged since 02/05/2015, suggesting a benign renal cyst. Normal bladder. Stomach/Bowel: Grossly normal stomach. Normal caliber small bowel with no small bowel wall thickening. Normal appendix. Normal large bowel with no diverticulosis, large bowel wall thickening or pericolonic fat stranding. Vascular/Lymphatic: Atherosclerotic nonaneurysmal abdominal aorta. Patent portal, splenic, hepatic and renal veins. New mildly enlarged 1.5 cm portacaval node (series 4/ image 158). No additional pathologically enlarged abdominopelvic lymph nodes. Reproductive: Normal size prostate. Other: No pneumoperitoneum, ascites or focal fluid collection. Musculoskeletal: No aggressive appearing focal osseous lesions. Mild degenerative changes in the lumbar spine. IMPRESSION: 1. Pleural-based lateral apical right upper lobe irregular 3.4 cm lung mass, probably representing interval growth of the 0.8 cm ground-glass pulmonary nodule described on the 02/05/2015 chest CT study, and highly suspicious for a primary bronchogenic carcinoma. Patchy ground-glass opacity and interlobular septal thickening in the right upper lobe of the lung probably represents alveolar hemorrhage and/or lymphangitic tumor. 2. Extensive irregular pleural thickening and enhancement in the apical right pleural space worrisome for malignant pleural spread. Trace basilar right pleural effusion. 3. New right hilar and bulky right paratracheal lymphadenopathy, suspicious for nodal metastases. 4. Three new liver masses worrisome for liver metastases. 5. New right adrenal nodule, worrisome for right adrenal metastasis. 6. New  portacaval upper retroperitoneal adenopathy, suspicious for nodal metastasis. 7. Thoracic surgical and oncology consultation advised for biopsy planning. PET-CT advised for further staging. Electronically Signed   By: Ilona Sorrel M.D.   On: 03/23/2016 22:01   Mr Jeri Cos VZ Contrast  03/24/2016  ADDENDUM REPORT: 03/24/2016 13:03 ADDENDUM: Tiny region of blood breakdown products left parietal lobe may reflect result of prior hemorrhagic ischemia. Electronically Signed   By: Genia Del M.D.   On: 03/24/2016 13:03  03/24/2016  CLINICAL DATA:  60 year old male with lung cancer. Staging. Initial encounter. EXAM: MRI HEAD WITHOUT AND WITH CONTRAST TECHNIQUE: Multiplanar, multiecho pulse sequences of the brain and surrounding structures were obtained without and with intravenous contrast. CONTRAST:  44m MULTIHANCE GADOBENATE DIMEGLUMINE 529 MG/ML IV SOLN COMPARISON:  02/05/2015 head CT. FINDINGS: Exam is motion degraded. No discrete parenchymal enhancing lesion however, on diffusion sequence punctate areas of restricted motion medial right parietal lobe, left frontal lobe and the right cerebellum raises possibility of small nonenhancing intracranial metastatic lesions. Heterogeneous bone marrow in a diffuse fashion without focal osseous destructive lesion. No acute infarct or intracranial hemorrhage. Mild chronic small vessel disease changes. Global atrophy without hydrocephalus. Major intracranial vascular  structures are patent. Complete opacification left maxillary sinus with small size suggests and result of chronic obstruction. Diminutive size pituitary gland. No worrisome orbital abnormality noted. IMPRESSION: Exam is motion degraded. No discrete parenchymal enhancing lesion however, on diffusion sequence punctate areas of restricted motion medial right parietal lobe, left frontal lobe and the right cerebellum raises possibility of small nonenhancing intracranial metastatic lesions. Heterogeneous bone marrow  in a diffuse fashion without focal osseous destructive lesion. Mild chronic small vessel disease changes. Global atrophy without hydrocephalus. Complete opacification left maxillary sinus with small size suggests and result of chronic obstruction. Electronically Signed: By: Genia Del M.D. On: 03/24/2016 12:50   Ct Abdomen Pelvis W Contrast  03/23/2016  CLINICAL DATA:  Indeterminate liver masses detected on unenhanced CT abdomen study from earlier today performed in the setting of right flank pain and chills. New pleural thickening and apical right upper lobe opacity on chest radiograph from earlier today. EXAM: CT CHEST, ABDOMEN, AND PELVIS WITH CONTRAST TECHNIQUE: Multidetector CT imaging of the chest, abdomen and pelvis was performed following the standard protocol during bolus administration of intravenous contrast. CONTRAST:  141m ISOVUE-300 IOPAMIDOL (ISOVUE-300) INJECTION 61% COMPARISON:  Unenhanced CT abdomen/pelvis from earlier today. Chest radiograph from earlier today. 02/05/2015 CT chest, abdomen and pelvis. FINDINGS: CT CHEST Mediastinum/Nodes: Normal heart size. No pericardial fluid/thickening. Left anterior descending coronary atherosclerosis. Atherosclerotic nonaneurysmal thoracic aorta. Normal caliber pulmonary arteries. No central pulmonary emboli. Normal visualized thyroid. Normal esophagus. No axillary adenopathy. Right hilar adenopathy measuring up to 1.5 cm (series 4/ image 71). No left hilar adenopathy. Bulky right paratracheal adenopathy measuring up to 2.8 cm (series 4/image 50). Lungs/Pleura: No pneumothorax. Trace basilar right pleural effusion. There is an irregular 3.4 x 2.2 cm pleural-based lateral apical right upper lobe lung mass (series 8/image 30), probably representing interval growth of the previously described 0.8 x 0.7 cm apical ground-glass right upper lobe pulmonary nodule on the 02/05/2015 chest CT study. This mass is continuous with extensive circumferential lobular  pleural thickening and enhancement in the apical right pleural space, which appears to invade the anterior mediastinum at the level of the proximal aortic arch (series 4/ image 59), and is also continuous with the right paratracheal adenopathy. Patchy ground-glass opacity throughout the parahilar and apical right upper lobe with associated interlobular septal thickening in this location. Apical left upper lobe pulmonary nodules measuring up to 0.6 cm (series 8/ image 16) are not appreciably changed since 02/05/2015. No additional new significant pulmonary nodules. Musculoskeletal:  No aggressive appearing focal osseous lesions. CT ABDOMEN AND PELVIS Hepatobiliary: At least 3 liver masses scattered in the right liver lobe, all new since 02/05/2015, demonstrating peripheral hyperenhancement, measuring 3.1 x 2.9 cm in the segment 6 right liver lobe (series 4/ image 154), 1.4 x 1.4 cm in segment 5 right liver lobe (series 4/ image 157) and 1.7 x 1.6 cm in the segment 8 right liver lobe (series 4/ image 128). Normal gallbladder with no radiopaque cholelithiasis. No biliary ductal dilatation. Pancreas: Normal, with no mass or duct dilation. Spleen: Normal size. No mass. Adrenals/Urinary Tract: New 1.2 cm right adrenal nodule (series 4/ image 152). No left adrenal nodule. No hydronephrosis. Simple 1.3 cm upper left renal cyst. Subcentimeter hypodense renal cortical lesion in the medial interpolar left kidney, too small to characterize, unchanged since 02/05/2015, suggesting a benign renal cyst. Normal bladder. Stomach/Bowel: Grossly normal stomach. Normal caliber small bowel with no small bowel wall thickening. Normal appendix. Normal large bowel with no diverticulosis, large bowel  wall thickening or pericolonic fat stranding. Vascular/Lymphatic: Atherosclerotic nonaneurysmal abdominal aorta. Patent portal, splenic, hepatic and renal veins. New mildly enlarged 1.5 cm portacaval node (series 4/ image 158). No additional  pathologically enlarged abdominopelvic lymph nodes. Reproductive: Normal size prostate. Other: No pneumoperitoneum, ascites or focal fluid collection. Musculoskeletal: No aggressive appearing focal osseous lesions. Mild degenerative changes in the lumbar spine. IMPRESSION: 1. Pleural-based lateral apical right upper lobe irregular 3.4 cm lung mass, probably representing interval growth of the 0.8 cm ground-glass pulmonary nodule described on the 02/05/2015 chest CT study, and highly suspicious for a primary bronchogenic carcinoma. Patchy ground-glass opacity and interlobular septal thickening in the right upper lobe of the lung probably represents alveolar hemorrhage and/or lymphangitic tumor. 2. Extensive irregular pleural thickening and enhancement in the apical right pleural space worrisome for malignant pleural spread. Trace basilar right pleural effusion. 3. New right hilar and bulky right paratracheal lymphadenopathy, suspicious for nodal metastases. 4. Three new liver masses worrisome for liver metastases. 5. New right adrenal nodule, worrisome for right adrenal metastasis. 6. New portacaval upper retroperitoneal adenopathy, suspicious for nodal metastasis. 7. Thoracic surgical and oncology consultation advised for biopsy planning. PET-CT advised for further staging. Electronically Signed   By: Ilona Sorrel M.D.   On: 03/23/2016 22:01   Ct Renal Stone Study  03/23/2016  CLINICAL DATA:  Right-sided flank pain at midnight. Chills. History kidney stones. EXAM: CT ABDOMEN AND PELVIS WITHOUT CONTRAST TECHNIQUE: Multidetector CT imaging of the abdomen and pelvis was performed following the standard protocol without IV contrast. COMPARISON:  02/05/2015 FINDINGS: Lower chest: Normal heart size, without pericardial effusion. Trace right pleural fluid with adjacent dependent atelectasis. New. Hepatobiliary: Subtle hypo attenuating right hepatic lobe low-density lesion of 2.7 cm on image 23/ series 2, new. A probable  pericholecystic right hepatic lobe lesion of 1.5 cm on image 25/series 2. Normal gallbladder, without biliary ductal dilatation. Pancreas: Normal, without mass or ductal dilatation. Spleen: Normal in size, without focal abnormality. Adrenals/Urinary Tract: Normal adrenal glands. A calcification the upper pole left kidney is likely associated with a otherwise simple appearing cyst on the prior exam. No right-sided renal calculi or hydronephrosis. No hydroureter or ureteric calculi. No bladder calculi. Stomach/Bowel: Portions of the stomach are underdistended. No gross abnormality identified. Scattered colonic diverticula. Normal terminal ileum and appendix. Normal small bowel. Vascular/Lymphatic: Aortic atherosclerosis. No abdominopelvic adenopathy. Reproductive: Normal prostate. Other: No significant free fluid. Musculoskeletal: Scattered pelvic bone islands. Mild disc bulge at the lumbosacral junction. IMPRESSION: 1.  No urinary tract calculi or hydronephrosis. 2. Subtle hypo attenuating right hepatic lobe lesion or lesions, likely new since the prior contrast-enhanced CT. Cannot exclude neoplastic process including metastatic disease or primary liver lesion/lesions. Recommend further evaluation with pre and post contrast outpatient abdominal MRI . If hepatic abscess is a concern in this patient with right-sided pain and fevers, contrast-enhanced liver protocol CT could be performed more emergently. 3. Trace right pleural fluid and adjacent atelectasis, new since prior CT. These results were called by telephone at the time of interpretation on 03/23/2016 at 6:56 pm to Dr. Larae Grooms , who verbally acknowledged these results. Electronically Signed   By: Abigail Miyamoto M.D.   On: 03/23/2016 18:58    Scheduled Meds: . famotidine  20 mg Oral BID  . metoprolol tartrate  25 mg Oral BID  . morphine  15 mg Oral Q12H  . piperacillin-tazobactam (ZOSYN)  IV  3.375 g Intravenous Q8H  . vancomycin  1,000 mg  Intravenous Q8H  Assessment/Plan:  1. Lung mass, liver masses, adrenal mass, lymphadenopathy in the chest. Likely patient has a metastatic lung cancer. Interventional radiology to do a lung biopsy tomorrow. MRI brain cant rule out metastasis. 2. Chest Pain -control with IV and oral medications. Add MS Contin 15 mg twice a day 3. Fever and leukocytosis. Patient initially put on vancomycin and Zosyn. Follow up cultures. Unclear whether this is infection or tumor fever. 4. Gastroesophageal reflux disease without esophagitis. I gave a GI cocktail and pepcid. 5. Muscle spasm. When necessary Flexeril.    Code Status:     Code Status Orders        Start     Ordered   03/24/16 0344  Full code   Continuous     03/24/16 0343    Code Status History    Date Active Date Inactive Code Status Order ID Comments User Context   This patient has a current code status but no historical code status.     Disposition Plan: Potentially home on Wednesday  Consultants:  oncology  Antibiotics:  Vancomycin  zosyn  Time spent: 20 minutes  [place], [place]

## 2016-03-25 NOTE — Progress Notes (Signed)
Pharmacy Antibiotic Note  Dennis Perkins is a 60 y.o. male admitted on 03/23/2016 with an intra-abdominal infection and urinary tract infection.  Pharmacy has been consulted for Zosyn and vancomycin dosing.  Plan: Vancomycin trough 10 mcg/mL. Recalculated Ke 0.119 hr-1. Increase to 1000 mg IV Q8H, predicted trough 15 mcg/mL. Pharmacy will continue to monitor and adjust as needed to maintain trough 15 to 20 mcg/mL.   Height: '5\' 9"'$  (175.3 cm) Weight: 147 lb 14.4 oz (67.087 kg) IBW/kg (Calculated) : 70.7  Temp (24hrs), Avg:99.5 F (37.5 C), Min:98.3 F (36.8 C), Max:101 F (38.3 C)   Recent Labs Lab 03/23/16 1818 03/24/16 0100 03/24/16 0442 03/25/16 0525  WBC 11.3*  --  13.1* 10.5  CREATININE 0.66  --  0.69 0.60*  LATICACIDVEN 2.5* 1.1 1.4  --   VANCOTROUGH  --   --   --  10    Estimated Creatinine Clearance: 94.4 mL/min (by C-G formula based on Cr of 0.6).    No Known Allergies  Thank you for allowing pharmacy to be a part of this patient's care.  Jasmarie Coppock A. [place], [place].D., BCPS Clinical Pharmacist  03/25/2016 6:57 AM

## 2016-03-25 NOTE — Evaluation (Signed)
Physical Therapy Evaluation Patient Details Name: Dennis Perkins MRN: [id number] DOB: [birth year] Today's Date: 03/25/2016   History of Present Illness  HPI: This is a 60 year old gentleman who states he is okay until 2 days ago when he developed a sudden onset of abdominal pain. Pain was dull for the most part with intermittent bouts of sharp shooting pain. Pain was located in the right lower abdomen and radiated to the back. At its worse it was 10/10,  Clinical Impression  Patient is 60 year old male who has complaints of back pain on right side that gets worse with ascending steps. He is independent with bed mobility, transfers and ambulation on level surfaces and steps and has no skilled PT needs at this time. He will not need any follow up PT at home .                                                    Follow Up Recommendations No PT follow up    Equipment Recommendations       Recommendations for Other Services       Precautions / Restrictions Precautions Precautions: None Restrictions Weight Bearing Restrictions: No      Mobility  Bed Mobility Overal bed mobility: Independent                Transfers Overall transfer level: Independent Equipment used: None                Ambulation/Gait Ambulation/Gait assistance: Independent Ambulation Distance (Feet): 500 Feet Assistive device: None Gait Pattern/deviations: WFL(Within Functional Limits)   Gait velocity interpretation: <1.8 ft/sec, indicative of risk for recurrent falls    Stairs Stairs: Yes Stairs assistance: Independent Stair Management: One rail Right Number of Stairs: 3    Wheelchair Mobility    Modified Rankin (Stroke Patients Only)       Balance Overall balance assessment: Independent                                           Pertinent Vitals/Pain Pain Assessment: 0-10 Pain Location: back Pain Descriptors / Indicators: Spasm Pain Intervention(s): Limited  activity within patient's tolerance    Home Living Family/patient expects to be discharged to:: Private residence Living Arrangements: Other relatives;Non-relatives/Friends Available Help at Discharge: Family Type of Home: House Home Access: Stairs to enter Entrance Stairs-Rails: Right Entrance Stairs-Number of Steps: 3 Home Layout: One level Home Equipment: None      Prior Function Level of Independence: Independent               Hand Dominance        Extremity/Trunk Assessment   Upper Extremity Assessment: Overall WFL for tasks assessed           Lower Extremity Assessment: Overall WFL for tasks assessed      Cervical / Trunk Assessment: Normal  Communication   Communication: No difficulties  Cognition Arousal/Alertness: Awake/alert Behavior During Therapy: WFL for tasks assessed/performed Overall Cognitive Status: Within Functional Limits for tasks assessed                      General Comments      Exercises        Assessment/Plan  PT Assessment Patent does not need any further PT services  PT Diagnosis Difficulty walking   PT Problem List    PT Treatment Interventions     PT Goals (Current goals can be found in the Care Plan section) Acute Rehab PT Goals Patient Stated Goal: no goals stated    Frequency     Barriers to discharge        Co-evaluation               End of Session Equipment Utilized During Treatment: Gait belt Activity Tolerance: Patient tolerated treatment well;Patient limited by pain Patient left:  (walking around the unit)      Functional Assessment Tool Used: clinical judgement    Time: 8264-1583 PT Time Calculation (min) (ACUTE ONLY): 20 min   Charges:   PT Evaluation $PT Eval Low Complexity: 1 Procedure     PT G Codes:   PT G-Codes **NOT FOR INPATIENT CLASS** Functional Assessment Tool Used: clinical judgement  Alanson Puls, PT, DPT  Arelia Sneddon S 03/25/2016, 10:41  AM

## 2016-03-25 NOTE — Progress Notes (Signed)
Ambulatory around unit, fair appetite, nausea and pain improved with prn medications, NPO after midnight for lung biopsy in am. On room air, anxious at times improved with rest.

## 2016-03-26 ENCOUNTER — Inpatient Hospital Stay: Payer: Medicaid Other

## 2016-03-26 LAB — BASIC METABOLIC PANEL
Anion gap: 6 (ref 5–15)
BUN: 6 mg/dL (ref 6–20)
CHLORIDE: 99 mmol/L — AB (ref 101–111)
CO2: 30 mmol/L (ref 22–32)
CREATININE: 0.94 mg/dL (ref 0.61–1.24)
Calcium: 8 mg/dL — ABNORMAL LOW (ref 8.9–10.3)
GFR calc Af Amer: >60 mL/min (ref >60)
GFR calc non Af Amer: >60 mL/min (ref >60)
Glucose, Bld: 90 mg/dL (ref 65–99)
Potassium: 3.1 mmol/L — ABNORMAL LOW (ref 3.5–5.1)
SODIUM: 135 mmol/L (ref 135–145)

## 2016-03-26 LAB — CBC
HCT: 33.5 % — ABNORMAL LOW (ref 40.0–52.0)
Hemoglobin: 11.7 g/dL — ABNORMAL LOW (ref 13.0–18.0)
MCH: 30.1 pg (ref 26.0–34.0)
MCHC: 35 g/dL (ref 32.0–36.0)
MCV: 86.2 fL (ref 80.0–100.0)
PLATELETS: 273 10*3/uL (ref 150–440)
RBC: 3.88 MIL/uL — ABNORMAL LOW (ref 4.40–5.90)
RDW: 13.2 % (ref 11.5–14.5)
WBC: 9.2 10*3/uL (ref 3.8–10.6)

## 2016-03-26 MED ORDER — POTASSIUM CHLORIDE CRYS ER 20 MEQ PO TBCR
40.0000 meq | EXTENDED_RELEASE_TABLET | Freq: Once | ORAL | Status: AC
  Administered 2016-03-26: 09:00:00 40 meq via ORAL
  Filled 2016-03-26: qty 2

## 2016-03-26 MED ORDER — MIDAZOLAM HCL 5 MG/5ML IJ SOLN
INTRAMUSCULAR | Status: AC | PRN
  Administered 2016-03-26: 14:00:00 1 mg via INTRAVENOUS
  Administered 2016-03-26 (×2): 0.5 mg via INTRAVENOUS

## 2016-03-26 MED ORDER — FENTANYL CITRATE (PF) 100 MCG/2ML IJ SOLN
INTRAMUSCULAR | Status: AC | PRN
  Administered 2016-03-26: 14:00:00 25 ug via INTRAVENOUS
  Administered 2016-03-26: 50 ug via INTRAVENOUS

## 2016-03-26 MED ORDER — SODIUM CHLORIDE 0.9 % IV SOLN
INTRAVENOUS | Status: DC
  Administered 2016-03-26: 10:00:00 via INTRAVENOUS

## 2016-03-26 MED ORDER — LEVOFLOXACIN 750 MG PO TABS
750.0000 mg | ORAL_TABLET | Freq: Every day | ORAL | Status: DC
  Administered 2016-03-26 – 2016-03-27 (×2): 750 mg via ORAL
  Filled 2016-03-26 (×2): qty 1

## 2016-03-26 MED ORDER — FENTANYL CITRATE (PF) 100 MCG/2ML IJ SOLN
INTRAMUSCULAR | Status: AC
  Filled 2016-03-26: qty 4

## 2016-03-26 MED ORDER — ENOXAPARIN SODIUM 40 MG/0.4ML ~~LOC~~ SOLN
40.0000 mg | SUBCUTANEOUS | Status: DC
  Administered 2016-03-26: 21:00:00 40 mg via SUBCUTANEOUS
  Filled 2016-03-26: qty 0.4

## 2016-03-26 MED ORDER — DEXTROSE 50 % IV SOLN
INTRAVENOUS | Status: AC
  Filled 2016-03-26: qty 50

## 2016-03-26 MED ORDER — MIDAZOLAM HCL 5 MG/5ML IJ SOLN
INTRAMUSCULAR | Status: AC
  Filled 2016-03-26: qty 10

## 2016-03-26 MED ORDER — SENNOSIDES-DOCUSATE SODIUM 8.6-50 MG PO TABS
1.0000 | ORAL_TABLET | Freq: Two times a day (BID) | ORAL | Status: DC
  Administered 2016-03-26 – 2016-03-27 (×3): 1 via ORAL
  Filled 2016-03-26 (×3): qty 1

## 2016-03-26 NOTE — Progress Notes (Signed)
Patient ID: Dennis Perkins, male   DOB: [birth year], 60 y.o.   MRN: [id number] [employer] [facility] PROGRESS NOTE  Dennis Perkins [id number]:[id number] DOB: 12-31-1955 DOA: 03/23/2016 PCP: Default, Provider, MD  HPI/Subjective: As per nursing staff, he got confused last night after IV pain medications and went into another persons bathroom. Patient feels like his pain is better controlled.  Objective: Filed Vitals:   03/26/16 1356 03/26/16 1400  BP: 116/82 110/82  Pulse: 91 90  Temp:    Resp: 15 16    Filed Weights   03/23/16 1755 03/24/16 0338  Weight: 68.947 kg (152 lb) 67.087 kg (147 lb 14.4 oz)    ROS: Review of Systems  Constitutional: Positive for fever. Negative for chills.  Eyes: Negative for blurred vision.  Respiratory: Positive for cough and shortness of breath.   Cardiovascular: Positive for chest pain.  Gastrointestinal: Positive for nausea. Negative for vomiting, abdominal pain, diarrhea and constipation.  Genitourinary: Negative for dysuria.  Musculoskeletal: Negative for joint pain.  Neurological: Negative for dizziness and headaches.   Exam: Physical Exam  Constitutional: He is oriented to person, place, and time.  HENT:  Nose: No mucosal edema.  Mouth/Throat: No oropharyngeal exudate or posterior oropharyngeal edema.  Eyes: Conjunctivae, EOM and lids are normal. Pupils are equal, round, and reactive to light.  Neck: No JVD present. Carotid bruit is not present. No edema present. No thyroid mass and no thyromegaly present.  Cardiovascular: S1 normal and S2 normal.  Exam reveals no gallop.   No murmur heard. Pulses:      Dorsalis pedis pulses are 2+ on the right side, and 2+ on the left side.  Respiratory: No respiratory distress. He has no wheezes. He has rhonchi in the right lower field. He has no rales.  GI: Soft. Bowel sounds are normal. There is no tenderness.  Musculoskeletal:       Right ankle: He exhibits no swelling.       Left ankle: He exhibits no  swelling.  Lymphadenopathy:    He has no cervical adenopathy.  Neurological: He is alert and oriented to person, place, and time. No cranial nerve deficit.  Skin: Skin is warm. No rash noted. Nails show no clubbing.  Psychiatric: He has a normal mood and affect.      Data Reviewed: Basic Metabolic Panel:  Recent Labs Lab 03/23/16 1818 03/24/16 0442 03/25/16 0525 03/26/16 0411  NA 142 136 132* 135  K 3.2* 3.0* 3.3* 3.1*  CL 103 102 99* 99*  CO2 '30 25 28 30  '$ GLUCOSE 128* 86 92 90  BUN 7 5* 7 6  CREATININE 0.66 0.69 0.60* 0.94  CALCIUM 8.4* 7.6* 8.0* 8.0*   Liver Function Tests:  Recent Labs Lab 03/23/16 1818  AST 54*  ALT 36  ALKPHOS 75  BILITOT 0.9  PROT 7.2  ALBUMIN 2.8*   CBC:  Recent Labs Lab 03/23/16 1818 03/24/16 0442 03/25/16 0525 03/26/16 0411  WBC 11.3* 13.1* 10.5 9.2  NEUTROABS 7.9*  --   --   --   HGB 14.2 12.7* 12.3* 11.7*  HCT 41.5 37.3* 34.6* 33.5*  MCV 86.3 87.6 86.3 86.2  PLT 343 291 290 273    Recent Results (from the past 240 hour(s))  Blood Culture (routine x 2)     Status: None (Preliminary result)   Collection Time: 03/23/16  6:18 PM  Result Value Ref Range Status   Specimen Description BLOOD RIGHT ANTECUBITAL  Final   Special Requests   Final  BOTTLES DRAWN AEROBIC AND ANAEROBIC  AER 10CC ANA 6CC   Culture NO GROWTH 3 DAYS  Final   Report Status PENDING  Incomplete  Blood Culture (routine x 2)     Status: None (Preliminary result)   Collection Time: 03/23/16  6:18 PM  Result Value Ref Range Status   Specimen Description BLOOD LEFT ANTECUBITAL  Final   Special Requests BOTTLES DRAWN AEROBIC AND ANAEROBIC  8CC  Final   Culture NO GROWTH 3 DAYS  Final   Report Status PENDING  Incomplete  Urine culture     Status: Abnormal   Collection Time: 03/23/16  7:15 PM  Result Value Ref Range Status   Specimen Description URINE, RANDOM  Final   Special Requests NONE  Final   Culture (A)  Final    <10,000 COLONIES/mL INSIGNIFICANT  GROWTH Performed at [facility]    Report Status 03/25/2016 FINAL  Final     Studies: Dg Chest 1 View  03/26/2016  CLINICAL DATA:  60 year old male with a history of biopsy pleural lesion EXAM: CHEST 1 VIEW COMPARISON:  Chest CT 03/23/2016, chest x-ray 03/23/2016 FINDINGS: Cardiomediastinal silhouette unchanged in size and contour. Pleural parenchymal thickening on the right, predominantly apical. Fullness in the right hilar region. No pneumothorax or pleural effusion. Left lung relatively well aerated. No displaced fracture IMPRESSION: Status post right-sided pleural mass biopsy with no complicating features. Signed, Dulcy Fanny. Earleen Newport, DO [facility] Radiology Electronically Signed   By: Corrie Mckusick D.O.   On: 03/26/2016 15:55   Ct Biopsy  03/26/2016  INDICATION: 60 year old male with a history of right-sided pleural mass. He has been referred for biopsy. EXAM: CT BIOPSY MEDICATIONS: None. ANESTHESIA/SEDATION: Moderate (conscious) sedation was employed during this procedure. A total of Versed 2.0 mg and Fentanyl 75 mcg was administered intravenously. Moderate Sedation Time: 12 minutes. The patient's level of consciousness and vital signs were monitored continuously by radiology nursing throughout the procedure under my direct supervision. FLUOROSCOPY TIME:  CT COMPLICATIONS: None PROCEDURE: The procedure, risks, benefits, and alternatives were explained to the patient and the patient's family. Specific risks that were addressed included bleeding, infection, pneumothorax, need for further procedure including chest tube placement, chance of delayed pneumothorax or hemorrhage, hemoptysis, nondiagnostic sample, cardiopulmonary collapse, death. Questions regarding the procedure were encouraged and answered. The patient understands and consents to the procedure. Patient was positioned in the prone position on the CT gantry table and a scout CT of the  chest was performed for planning purposes. Once angle of approach was determined, the skin and subcutaneous tissues this scan was prepped and draped in the usual sterile fashion, and a sterile drape was applied covering the operative field. A sterile gown and sterile gloves were used for the procedure. Local anesthesia was provided with 1% Lidocaine. The skin and subcutaneous tissues were infiltrated 1% lidocaine for local anesthesia, and a small stab incision was made with an 11 blade scalpel. Using CT guidance, a 17 gauge trocar needle was advanced into the right pleuraltarget. After confirmation of the tip, multiple separate 18 gauge core biopsies were performed. These were placed into solution for transportation to the lab. Final CT image was performed. Patient tolerated the procedure well and remained hemodynamically stable throughout. No complications were encountered and no significant blood loss was encountered. IMPRESSION: Status post CT-guided biopsy of right pleural mass. Tissue specimen sent to pathology for complete histopathologic analysis. Signed, Dulcy Fanny. Earleen Newport, DO [facility]  Radiology Electronically Signed   By: Corrie Mckusick D.O.   On: 03/26/2016 15:57    Scheduled Meds: . dextrose      . enoxaparin (LOVENOX) injection  40 mg Subcutaneous Q24H  . famotidine  20 mg Oral BID  . fentaNYL      . levofloxacin  750 mg Oral Daily  . metoprolol tartrate  25 mg Oral BID  . midazolam      . morphine  15 mg Oral Q12H  . senna-docusate  1 tablet Oral BID    Assessment/Plan:  1. Lung mass, liver masses, adrenal mass, lymphadenopathy in the chest. Likely patient has a metastatic lung cancer. Interventional radiology Did a lung biopsy today. MRI brain cant rule out metastasis. 2. Chest Pain -discontinue IV pain medication. Continue when necessary oxycodone and MS Contin 15 mg twice a day 3. Fever and leukocytosis. Likely tumor fever. Switch  antibiotics to oral Levaquin 4. Gastroesophageal reflux disease without esophagitis. Continue pepcid. 5. Muscle spasm. When necessary Flexeril.    Code Status:     Code Status Orders        Start     Ordered   03/24/16 0344  Full code   Continuous     03/24/16 0343    Code Status History    Date Active Date Inactive Code Status Order ID Comments User Context   This patient has a current code status but no historical code status.     Disposition Plan: Potentially home on Wednesday Or Thursday  Consultants:  oncology  Antibiotics:  Levaquin  Time spent: 20 minutes  [place], [place]

## 2016-03-26 NOTE — Procedures (Signed)
Interventional Radiology Procedure Note  Procedure: CT guided right pleural mets biopsy.  Complications: None Recommendations:  - NPO x 1 hour - CXR in 1 hour - 1 hour of post-sedation recovery - Ok to shower tomorrow - Do not submerge for 7 days - Routine care   Signed,  Dulcy Fanny. Earleen Newport, DO

## 2016-03-26 NOTE — Care Management (Signed)
Scheduled for Lung biopsy today around 1:00pm.  Chart review indicated that Mr. Tantillo doesn't have any insurance listed.  Left [facility] and Medication Management applications on table top. Mr. Haque sleeping. Shelbie Ammons RN MSN CCM Care Management [phone]

## 2016-03-27 LAB — MAGNESIUM: Magnesium: 1.8 mg/dL (ref 1.7–2.4)

## 2016-03-27 LAB — BASIC METABOLIC PANEL WITH GFR
Anion gap: 6 (ref 5–15)
BUN: 6 mg/dL (ref 6–20)
CO2: 28 mmol/L (ref 22–32)
Calcium: 8 mg/dL — ABNORMAL LOW (ref 8.9–10.3)
Chloride: 102 mmol/L (ref 101–111)
Creatinine, Ser: 0.9 mg/dL (ref 0.61–1.24)
GFR calc Af Amer: >60 mL/min
GFR calc non Af Amer: >60 mL/min
Glucose, Bld: 92 mg/dL (ref 65–99)
Potassium: 3.4 mmol/L — ABNORMAL LOW (ref 3.5–5.1)
Sodium: 136 mmol/L (ref 135–145)

## 2016-03-27 LAB — CBC
HCT: 32.7 % — ABNORMAL LOW (ref 40.0–52.0)
Hemoglobin: 11.4 g/dL — ABNORMAL LOW (ref 13.0–18.0)
MCH: 30 pg (ref 26.0–34.0)
MCHC: 35 g/dL (ref 32.0–36.0)
MCV: 85.8 fL (ref 80.0–100.0)
Platelets: 273 K/uL (ref 150–440)
RBC: 3.81 MIL/uL — ABNORMAL LOW (ref 4.40–5.90)
RDW: 13.3 % (ref 11.5–14.5)
WBC: 9.3 K/uL (ref 3.8–10.6)

## 2016-03-27 MED ORDER — POLYETHYLENE GLYCOL 3350 17 G PO PACK: 17.0000 g | PACK | Freq: Every day | ORAL | Status: DC | PRN

## 2016-03-27 MED ORDER — LEVOFLOXACIN 750 MG PO TABS: 750.0000 mg | ORAL_TABLET | Freq: Every day | ORAL | Status: DC

## 2016-03-27 MED ORDER — POTASSIUM CHLORIDE CRYS ER 20 MEQ PO TBCR
40.0000 meq | EXTENDED_RELEASE_TABLET | Freq: Once | ORAL | Status: AC
  Administered 2016-03-27: 09:00:00 40 meq via ORAL

## 2016-03-27 MED ORDER — METOPROLOL TARTRATE 25 MG PO TABS: 25.0000 mg | ORAL_TABLET | Freq: Two times a day (BID) | ORAL | Status: DC

## 2016-03-27 MED ORDER — OXYCODONE HCL 5 MG PO TABS: 5.0000 mg | ORAL_TABLET | ORAL | Status: DC | PRN

## 2016-03-27 MED ORDER — FAMOTIDINE 20 MG PO TABS: 20.0000 mg | ORAL_TABLET | Freq: Two times a day (BID) | ORAL | Status: AC

## 2016-03-27 MED ORDER — MORPHINE SULFATE ER 15 MG PO TBCR: 15.0000 mg | EXTENDED_RELEASE_TABLET | Freq: Two times a day (BID) | ORAL | Status: DC

## 2016-03-27 NOTE — Discharge Summary (Signed)
[facility] NAME: Dennis Perkins    MR#:  [id number]  DATE OF BIRTH:  July 02, 1956  DATE OF ADMISSION:  03/23/2016 ADMITTING PHYSICIAN: Quintella Baton, MD  DATE OF DISCHARGE: 03/27/2016  1:00 PM  PRIMARY CARE PHYSICIAN: Dr. Alyssa Grove   ADMISSION DIAGNOSIS:  Right flank pain [R10.9] Opacity noted on imaging study [R93.8] Metastatic lung cancer (metastasis from lung to other site), unspecified laterality ([place]) [C34.90] Sepsis, due to unspecified organism ([place]) [A41.9]  DISCHARGE DIAGNOSIS:  Principal Problem:   Lung cancer ([place]) Active Problems:   Tobacco abuse   HTN (hypertension)   SECONDARY DIAGNOSIS:  History reviewed. No pertinent past medical history.  HOSPITAL COURSE:   1. 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 Patient was admitted with chest pain and found to have masses on CT scan of the lung liver and adrenal gland. A biopsy was done by interventional radiology on 01/25/2016. Preliminary results are non-small cell carcinoma. Another 24 hours will be needed to look at this for further identification. Patient will follow-up with Dr. Grayland Ormond oncology as outpatient for treatment options. 2. Chest pain likely secondary to tumor. I started MS Contin 15 mg twice day and when necessary oxycodone. 3. Clinical sepsis with no source. Still with Fever. This is likely tumor fever. We did start aggressive antibiotics with vancomycin and Zosyn but I switched over to Levaquin. I will finish course of Levaquin. Patient's cultures are negative and white blood cell count is normal. I do think this is tumor fever. He can take Tylenol and ibuprofen as outpatient 4. Hypokalemia replaced as inpatient 5. Anemia likely of chronic disease 6. Tachycardia started on metoprolol  DISCHARGE CONDITIONS:   Fair  CONSULTS OBTAINED:  Treatment Team:  Lloyd Huger, MD  DRUG ALLERGIES:  No Known  Allergies  DISCHARGE MEDICATIONS:   Discharge Medication List as of 03/27/2016 11:39 AM    START taking these medications   Details  famotidine (PEPCID) 20 MG tablet Take 1 tablet (20 mg total) by mouth 2 (two) times daily., Starting 03/27/2016, Until Discontinued, Print    levofloxacin (LEVAQUIN) 750 MG tablet Take 1 tablet (750 mg total) by mouth daily., Starting 03/27/2016, Until Discontinued, Print    metoprolol tartrate (LOPRESSOR) 25 MG tablet Take 1 tablet (25 mg total) by mouth 2 (two) times daily., Starting 03/27/2016, Until Discontinued, Print    morphine (MS CONTIN) 15 MG 12 hr tablet Take 1 tablet (15 mg total) by mouth every 12 (twelve) hours., Starting 03/27/2016, Until Discontinued, Print    oxyCODONE (OXY IR/ROXICODONE) 5 MG immediate release tablet Take 1 tablet (5 mg total) by mouth every 4 (four) hours as needed for moderate pain or breakthrough pain., Starting 03/27/2016, Until Discontinued, Print    polyethylene glycol (MIRALAX / GLYCOLAX) packet Take 17 g by mouth daily as needed for mild constipation or moderate constipation., Starting 03/27/2016, Until Discontinued, Print      CONTINUE these medications which have NOT CHANGED   Details  acetaminophen (TYLENOL) 325 MG tablet Take 650 mg by mouth every 6 (six) hours as needed., Until Discontinued, Historical Med    ibuprofen (ADVIL,MOTRIN) 200 MG tablet Take 200 mg by mouth every 6 (six) hours as needed., Until Discontinued, Historical Med         DISCHARGE INSTRUCTIONS:   Follow-up with Dr. Grayland Ormond one week  If you experience worsening of your admission symptoms, develop shortness of breath, life threatening emergency, suicidal or homicidal  thoughts you must seek medical attention immediately by calling 911 or calling your MD immediately  if symptoms less severe.  You Must read complete instructions/literature along with all the possible adverse reactions/side effects for all the Medicines you take and that have  been prescribed to you. Take any new Medicines after you have completely understood and accept all the possible adverse reactions/side effects.   Please note  You were cared for by a hospitalist during your hospital stay. If you have any questions about your discharge medications or the care you received while you were in the hospital after you are discharged, you can call the unit and asked to speak with the hospitalist on call if the hospitalist that took care of you is not available. Once you are discharged, your primary care physician will handle any further medical issues. Please note that NO REFILLS for any discharge medications will be authorized once you are discharged, as it is imperative that you return to your primary care physician (or establish a relationship with a primary care physician if you do not have one) for your aftercare needs so that they can reassess your need for medications and monitor your lab values.    Today   CHIEF COMPLAINT:   Chief Complaint  Patient presents with  . Flank Pain    HISTORY OF PRESENT ILLNESS:  Dennis Perkins  is a 60 y.o. male presents with chest pain and found to have lung mass, liver masses and adrenal mass   VITAL SIGNS:  Blood pressure 112/72, pulse 87, temperature 99.4 F (37.4 C), temperature source Oral, resp. rate 18, height '5\' 9"'$  (1.753 m), weight 67.087 kg (147 lb 14.4 oz), SpO2 95 %.    PHYSICAL EXAMINATION:  GENERAL:  60 y.o.-year-old patient lying in the bed with no acute distress.  EYES: Pupils equal, round, reactive to light and accommodation. No scleral icterus. Extraocular muscles intact.  HEENT: Head atraumatic, normocephalic. Oropharynx and nasopharynx clear.  NECK:  Supple, no jugular venous distention. No thyroid enlargement, no tenderness.  LUNGS: Normal breath sounds bilaterally, no wheezing, rales,rhonchi or crepitation. No use of accessory muscles of respiration.  CARDIOVASCULAR: S1, S2 normal. No murmurs,  rubs, or gallops.  ABDOMEN: Soft, non-tender, non-distended. Bowel sounds present. No organomegaly or mass.  EXTREMITIES: No pedal edema, cyanosis, or clubbing.  NEUROLOGIC: Cranial nerves II through XII are intact. Muscle strength 5/5 in all extremities. Sensation intact. Gait not checked.  PSYCHIATRIC: The patient is alert and oriented x 3.  SKIN: No obvious rash, lesion, or ulcer.   DATA REVIEW:   CBC  Recent Labs Lab 03/27/16 0352  WBC 9.3  HGB 11.4*  HCT 32.7*  PLT 273    Chemistries   Recent Labs Lab 03/23/16 1818  03/27/16 0352  NA 142  < > 136  K 3.2*  < > 3.4*  CL 103  < > 102  CO2 30  < > 28  GLUCOSE 128*  < > 92  BUN 7  < > 6  CREATININE 0.66  < > 0.90  CALCIUM 8.4*  < > 8.0*  MG  --   --  1.8  AST 54*  --   --   ALT 36  --   --   ALKPHOS 75  --   --   BILITOT 0.9  --   --   < > = values in this interval not displayed.   Microbiology Results  Results for orders placed or performed during the hospital  encounter of 03/23/16  Blood Culture (routine x 2)     Status: None (Preliminary result)   Collection Time: 03/23/16  6:18 PM  Result Value Ref Range Status   Specimen Description BLOOD RIGHT ANTECUBITAL  Final   Special Requests   Final    BOTTLES DRAWN AEROBIC AND ANAEROBIC  AER 10CC ANA 6CC   Culture NO GROWTH 4 DAYS  Final   Report Status PENDING  Incomplete  Blood Culture (routine x 2)     Status: None (Preliminary result)   Collection Time: 03/23/16  6:18 PM  Result Value Ref Range Status   Specimen Description BLOOD LEFT ANTECUBITAL  Final   Special Requests BOTTLES DRAWN AEROBIC AND ANAEROBIC  8CC  Final   Culture NO GROWTH 4 DAYS  Final   Report Status PENDING  Incomplete  Urine culture     Status: Abnormal   Collection Time: 03/23/16  7:15 PM  Result Value Ref Range Status   Specimen Description URINE, RANDOM  Final   Special Requests NONE  Final   Culture (A)  Final    <10,000 COLONIES/mL INSIGNIFICANT GROWTH Performed at [facility]    Report Status 03/25/2016 FINAL  Final    RADIOLOGY:  Dg Chest 1 View  03/26/2016  CLINICAL DATA:  60 year old male with a history of biopsy pleural lesion EXAM: CHEST 1 VIEW COMPARISON:  Chest CT 03/23/2016, chest x-ray 03/23/2016 FINDINGS: Cardiomediastinal silhouette unchanged in size and contour. Pleural parenchymal thickening on the right, predominantly apical. Fullness in the right hilar region. No pneumothorax or pleural effusion. Left lung relatively well aerated. No displaced fracture IMPRESSION: Status post right-sided pleural mass biopsy with no complicating features. Signed, Dulcy Fanny. Earleen Newport, DO [facility] Radiology Electronically Signed   By: Corrie Mckusick D.O.   On: 03/26/2016 15:55   Ct Biopsy  03/26/2016  INDICATION: 60 year old male with a history of right-sided pleural mass. He has been referred for biopsy. EXAM: CT BIOPSY MEDICATIONS: None. ANESTHESIA/SEDATION: Moderate (conscious) sedation was employed during this procedure. A total of Versed 2.0 mg and Fentanyl 75 mcg was administered intravenously. Moderate Sedation Time: 12 minutes. The patient's level of consciousness and vital signs were monitored continuously by radiology nursing throughout the procedure under my direct supervision. FLUOROSCOPY TIME:  CT COMPLICATIONS: None PROCEDURE: The procedure, risks, benefits, and alternatives were explained to the patient and the patient's family. Specific risks that were addressed included bleeding, infection, pneumothorax, need for further procedure including chest tube placement, chance of delayed pneumothorax or hemorrhage, hemoptysis, nondiagnostic sample, cardiopulmonary collapse, death. Questions regarding the procedure were encouraged and answered. The patient understands and consents to the procedure. Patient was positioned in the prone position on the CT gantry table and a scout CT of the chest was performed for planning  purposes. Once angle of approach was determined, the skin and subcutaneous tissues this scan was prepped and draped in the usual sterile fashion, and a sterile drape was applied covering the operative field. A sterile gown and sterile gloves were used for the procedure. Local anesthesia was provided with 1% Lidocaine. The skin and subcutaneous tissues were infiltrated 1% lidocaine for local anesthesia, and a small stab incision was made with an 11 blade scalpel. Using CT guidance, a 17 gauge trocar needle was advanced into the right pleuraltarget. After confirmation of the tip, multiple separate 18 gauge core biopsies were performed. These were placed into solution for transportation to the lab. Final CT image was performed.  Patient tolerated the procedure well and remained hemodynamically stable throughout. No complications were encountered and no significant blood loss was encountered. IMPRESSION: Status post CT-guided biopsy of right pleural mass. Tissue specimen sent to pathology for complete histopathologic analysis. Signed, Dulcy Fanny. Earleen Newport, DO [facility] Radiology Electronically Signed   By: Corrie Mckusick D.O.   On: 03/26/2016 15:57    Management plans discussed with the patient, family and they are in agreement.  CODE STATUS:  Code Status History    Date Active Date Inactive Code Status Order ID Comments User Context   03/24/2016  3:43 AM 03/27/2016  4:30 PM Full Code [id number]  Quintella Baton, MD Inpatient      TOTAL TIME TAKING CARE OF THIS PATIENT: 35 minutes.    Loletha Grayer M.D on 03/27/2016 at 5:10 PM  Between 7am to 6pm - Pager - [phone]  After 6pm go to [email or web address] - password [email or web address] [facility]  [phone]  CC: Primary care physician; Dr. Alyssa Grove

## 2016-03-27 NOTE — Care Management (Signed)
Spoke with Dennis Perkins at the bedside. States he doesn't have a primary care physician. "possibly follow-up at the [facility]." A resident of [address]. Never been to the [facility]. Interested in the [facility], if needed. Friends will transport. Shelbie Ammons RN MSN CCM Care Management [phone]

## 2016-03-27 NOTE — Progress Notes (Signed)
Pt being discharged home at this time, discharge instructions and prescriptions reviewed with pt, states understanding, prescriptions faxed to med management, pt refused wheelchair at discharge, pt with no noted complaints, no distress or discomfort noted

## 2016-03-27 NOTE — Discharge Instructions (Signed)
Lung Biopsy A lung biopsy is a procedure in which a tissue sample is removed from the lung. The tissue can be examined under a microscope to help diagnose various lung disorders.  LET [facility] CARE PROVIDER KNOW ABOUT:  Any allergies you have.  All medicines you are taking, including vitamins, herbs, eye drops, creams, and over-the-counter medicines.  Previous problems you or members of your family have had with the use of anesthetics.  Any blood disorders or bleeding problems that you have.  Previous surgeries you have had.  Medical conditions you have. RISKS AND COMPLICATIONS Generally, a lung biopsy is a safe procedure. However, problems can occur and include:  Collapse of the lung.   Bleeding.   Infection.  BEFORE THE PROCEDURE  Do not eat or drink anything after midnight on the night before the procedure or as directed by your health care provider.  Ask your health care provider about changing or stopping your regular medicines. This is especially important if you are taking diabetes medicines or blood thinners.  Plan to have someone take you home after the procedure. PROCEDURE Various methods can be used to perform a lung biopsy:   Needle biopsy. A biopsy needle is inserted into the lung. The needle is used to collect the tissue sample. A CT scanner may be used to guide the needle to the right place in the lung. For this method, a medicine is used to numb the area where the biopsy sample will be taken (local anesthetic).  Bronchoscopy. A flexible tube (bronchoscope) is inserted into your lungs by going through your mouth or nose. A needle or forceps is passed through the bronchoscope to remove the tissue sample. For this method, medicine may be used to numb the back of your throat.  Open biopsy. A cut (incision) is made in your chest. The tissue sample is then removed using surgical tools. The incision is closed with skin glue, skin adhesive strips, or stitches. For  this method, you will be given medicine to make you sleep through the procedure (general anesthetic). AFTER THE PROCEDURE  Your recovery will be assessed and monitored.  You might have soreness and tenderness at the site of the biopsy for a few days after the procedure.  You might have a cough and some soreness in your throat for a few days if a bronchoscope was used.   This information is not intended to replace advice given to you by your health care provider. Make sure you discuss any questions you have with your health care provider.   Document Released: 01/02/2005 Document Revised: 11/04/2014 Document Reviewed: 03/28/2013 Elsevier Interactive Patient Education [employer].

## 2016-03-27 NOTE — Progress Notes (Signed)
Pastoral Care provided.

## 2016-03-27 NOTE — Care Management (Signed)
Discharge to home today per Dr. Leslye Peer. Prescriptions faxed to Medication Management. Will be following up at the [facility]. Friend/family will transport. Shelbie Ammons RN MSN CCM Care Management [phone]

## 2016-03-28 LAB — SURGICAL PATHOLOGY

## 2016-04-04 LAB — CULTURE, BLOOD (ROUTINE X 2)
Culture: NO GROWTH
Culture: NO GROWTH

## 2016-04-08 ENCOUNTER — Inpatient Hospital Stay: Payer: MEDICAID | Admitting: Oncology

## 2016-04-14 ENCOUNTER — Other Ambulatory Visit: Payer: Self-pay

## 2016-04-14 ENCOUNTER — Emergency Department: Payer: Medicaid Other

## 2016-04-14 ENCOUNTER — Inpatient Hospital Stay
Admission: EM | Admit: 2016-04-14 | Discharge: 2016-04-26 | DRG: 190 | Disposition: A | Discharge status: Hospice | Payer: Medicaid Other | Attending: Internal Medicine | Admitting: Internal Medicine

## 2016-04-14 ENCOUNTER — Encounter: Payer: Self-pay | Admitting: Emergency Medicine

## 2016-04-14 DIAGNOSIS — I82B11 Acute embolism and thrombosis of right subclavian vein: Secondary | ICD-10-CM | POA: Diagnosis present

## 2016-04-14 DIAGNOSIS — E43 Unspecified severe protein-calorie malnutrition: Secondary | ICD-10-CM | POA: Insufficient documentation

## 2016-04-14 DIAGNOSIS — C3411 Malignant neoplasm of upper lobe, right bronchus or lung: Secondary | ICD-10-CM | POA: Diagnosis present

## 2016-04-14 DIAGNOSIS — R410 Disorientation, unspecified: Secondary | ICD-10-CM

## 2016-04-14 DIAGNOSIS — C787 Secondary malignant neoplasm of liver and intrahepatic bile duct: Secondary | ICD-10-CM | POA: Diagnosis present

## 2016-04-14 DIAGNOSIS — Z803 Family history of malignant neoplasm of breast: Secondary | ICD-10-CM

## 2016-04-14 DIAGNOSIS — J188 Other pneumonia, unspecified organism: Secondary | ICD-10-CM | POA: Diagnosis present

## 2016-04-14 DIAGNOSIS — G893 Neoplasm related pain (acute) (chronic): Secondary | ICD-10-CM | POA: Diagnosis present

## 2016-04-14 DIAGNOSIS — R609 Edema, unspecified: Secondary | ICD-10-CM

## 2016-04-14 DIAGNOSIS — C3490 Malignant neoplasm of unspecified part of unspecified bronchus or lung: Secondary | ICD-10-CM

## 2016-04-14 DIAGNOSIS — Y92009 Unspecified place in unspecified non-institutional (private) residence as the place of occurrence of the external cause: Secondary | ICD-10-CM

## 2016-04-14 DIAGNOSIS — R197 Diarrhea, unspecified: Secondary | ICD-10-CM | POA: Diagnosis present

## 2016-04-14 DIAGNOSIS — G92 Toxic encephalopathy: Secondary | ICD-10-CM | POA: Diagnosis not present

## 2016-04-14 DIAGNOSIS — E861 Hypovolemia: Secondary | ICD-10-CM | POA: Diagnosis present

## 2016-04-14 DIAGNOSIS — D72829 Elevated white blood cell count, unspecified: Secondary | ICD-10-CM

## 2016-04-14 DIAGNOSIS — J189 Pneumonia, unspecified organism: Secondary | ICD-10-CM | POA: Diagnosis present

## 2016-04-14 DIAGNOSIS — E876 Hypokalemia: Secondary | ICD-10-CM | POA: Diagnosis present

## 2016-04-14 DIAGNOSIS — Z801 Family history of malignant neoplasm of trachea, bronchus and lung: Secondary | ICD-10-CM

## 2016-04-14 DIAGNOSIS — Z6821 Body mass index (BMI) 21.0-21.9, adult: Secondary | ICD-10-CM

## 2016-04-14 DIAGNOSIS — E871 Hypo-osmolality and hyponatremia: Secondary | ICD-10-CM | POA: Diagnosis present

## 2016-04-14 DIAGNOSIS — R509 Fever, unspecified: Secondary | ICD-10-CM

## 2016-04-14 DIAGNOSIS — Z515 Encounter for palliative care: Secondary | ICD-10-CM

## 2016-04-14 DIAGNOSIS — R634 Abnormal weight loss: Secondary | ICD-10-CM | POA: Diagnosis present

## 2016-04-14 DIAGNOSIS — I10 Essential (primary) hypertension: Secondary | ICD-10-CM | POA: Diagnosis present

## 2016-04-14 DIAGNOSIS — J440 Chronic obstructive pulmonary disease with acute lower respiratory infection: Principal | ICD-10-CM | POA: Diagnosis present

## 2016-04-14 DIAGNOSIS — C7951 Secondary malignant neoplasm of bone: Secondary | ICD-10-CM | POA: Diagnosis present

## 2016-04-14 DIAGNOSIS — F17210 Nicotine dependence, cigarettes, uncomplicated: Secondary | ICD-10-CM | POA: Diagnosis present

## 2016-04-14 DIAGNOSIS — Z66 Do not resuscitate: Secondary | ICD-10-CM

## 2016-04-14 DIAGNOSIS — C799 Secondary malignant neoplasm of unspecified site: Secondary | ICD-10-CM

## 2016-04-14 DIAGNOSIS — R112 Nausea with vomiting, unspecified: Secondary | ICD-10-CM | POA: Diagnosis present

## 2016-04-14 DIAGNOSIS — T404X5A Adverse effect of other synthetic narcotics, initial encounter: Secondary | ICD-10-CM | POA: Diagnosis not present

## 2016-04-14 DIAGNOSIS — R680 Hypothermia, not associated with low environmental temperature: Secondary | ICD-10-CM | POA: Diagnosis present

## 2016-04-14 HISTORY — DX: Essential (primary) hypertension: I10

## 2016-04-14 HISTORY — DX: Malignant neoplasm of unspecified part of unspecified bronchus or lung: C34.90

## 2016-04-14 HISTORY — DX: Chronic obstructive pulmonary disease, unspecified: J44.9

## 2016-04-14 LAB — URINALYSIS COMPLETE WITH MICROSCOPIC (ARMC ONLY)
BILIRUBIN URINE: NEGATIVE
Bacteria, UA: NONE SEEN
Glucose, UA: NEGATIVE mg/dL
Hgb urine dipstick: NEGATIVE
KETONES UR: NEGATIVE mg/dL
Leukocytes, UA: NEGATIVE
NITRITE: NEGATIVE
PH: 5 (ref 5.0–8.0)
PROTEIN: 30 mg/dL — AB
SPECIFIC GRAVITY, URINE: 1.02 (ref 1.005–1.030)

## 2016-04-14 LAB — CBC
HEMATOCRIT: 39.8 % — AB (ref 40.0–52.0)
HEMOGLOBIN: 13.4 g/dL (ref 13.0–18.0)
MCH: 27.8 pg (ref 26.0–34.0)
MCHC: 33.8 g/dL (ref 32.0–36.0)
MCV: 82.4 fL (ref 80.0–100.0)
PLATELETS: 401 10*3/uL (ref 150–440)
RBC: 4.83 MIL/uL (ref 4.40–5.90)
RDW: 14.2 % (ref 11.5–14.5)
WBC: 25.9 10*3/uL — ABNORMAL HIGH (ref 3.8–10.6)

## 2016-04-14 LAB — COMPREHENSIVE METABOLIC PANEL
ALBUMIN: 2.2 g/dL — AB (ref 3.5–5.0)
ALK PHOS: 91 U/L (ref 38–126)
ALT: 36 U/L (ref 17–63)
ANION GAP: 13 (ref 5–15)
AST: 82 U/L — AB (ref 15–41)
BUN: 11 mg/dL (ref 6–20)
CO2: 29 mmol/L (ref 22–32)
Calcium: 8.2 mg/dL — ABNORMAL LOW (ref 8.9–10.3)
Chloride: 88 mmol/L — ABNORMAL LOW (ref 101–111)
Creatinine, Ser: 0.69 mg/dL (ref 0.61–1.24)
GFR calc Af Amer: >60 mL/min (ref >60)
GFR calc non Af Amer: >60 mL/min (ref >60)
GLUCOSE: 117 mg/dL — AB (ref 65–99)
Potassium: 2.6 mmol/L — CL (ref 3.5–5.1)
Sodium: 130 mmol/L — ABNORMAL LOW (ref 135–145)
Total Bilirubin: 1 mg/dL (ref 0.3–1.2)
Total Protein: 7.3 g/dL (ref 6.5–8.1)

## 2016-04-14 LAB — MAGNESIUM: MAGNESIUM: 1.6 mg/dL — AB (ref 1.7–2.4)

## 2016-04-14 LAB — LIPASE, BLOOD: LIPASE: 17 U/L (ref 11–51)

## 2016-04-14 MED ORDER — POTASSIUM CHLORIDE IN NACL 20-0.9 MEQ/L-% IV SOLN
INTRAVENOUS | Status: DC
  Administered 2016-04-14 – 2016-04-17 (×7): via INTRAVENOUS
  Filled 2016-04-14 (×12): qty 1000

## 2016-04-14 MED ORDER — ENOXAPARIN SODIUM 40 MG/0.4ML ~~LOC~~ SOLN
40.0000 mg | SUBCUTANEOUS | Status: DC
  Administered 2016-04-14 – 2016-04-21 (×8): 40 mg via SUBCUTANEOUS
  Filled 2016-04-14 (×8): qty 0.4

## 2016-04-14 MED ORDER — FENTANYL 25 MCG/HR TD PT72
25.0000 ug | MEDICATED_PATCH | TRANSDERMAL | Status: DC
  Administered 2016-04-14: 25 ug via TRANSDERMAL
  Filled 2016-04-14: qty 1

## 2016-04-14 MED ORDER — POTASSIUM CHLORIDE CRYS ER 20 MEQ PO TBCR
20.0000 meq | EXTENDED_RELEASE_TABLET | Freq: Two times a day (BID) | ORAL | Status: DC
  Administered 2016-04-14 – 2016-04-16 (×4): 20 meq via ORAL
  Filled 2016-04-14 (×4): qty 1

## 2016-04-14 MED ORDER — METOPROLOL TARTRATE 25 MG PO TABS
25.0000 mg | ORAL_TABLET | Freq: Two times a day (BID) | ORAL | Status: DC
  Administered 2016-04-14 – 2016-04-21 (×15): 25 mg via ORAL
  Filled 2016-04-14 (×15): qty 1

## 2016-04-14 MED ORDER — ACETAMINOPHEN 325 MG PO TABS
650.0000 mg | ORAL_TABLET | Freq: Four times a day (QID) | ORAL | Status: DC | PRN
  Administered 2016-04-17 – 2016-04-26 (×5): 650 mg via ORAL
  Filled 2016-04-14 (×5): qty 2

## 2016-04-14 MED ORDER — NITROGLYCERIN 0.4 MG SL SUBL
0.4000 mg | SUBLINGUAL_TABLET | SUBLINGUAL | Status: DC | PRN
  Administered 2016-04-14: 0.4 mg via SUBLINGUAL

## 2016-04-14 MED ORDER — SODIUM CHLORIDE 0.9 % IV SOLN
Freq: Once | INTRAVENOUS | Status: AC
  Administered 2016-04-14: 16:00:00 via INTRAVENOUS

## 2016-04-14 MED ORDER — ONDANSETRON HCL 4 MG PO TABS: 4.0000 mg | ORAL_TABLET | Freq: Four times a day (QID) | ORAL | Status: DC | PRN

## 2016-04-14 MED ORDER — HYDROMORPHONE HCL 1 MG/ML IJ SOLN
1.0000 mg | Freq: Once | INTRAMUSCULAR | Status: AC
  Administered 2016-04-14: 1 mg via INTRAVENOUS
  Filled 2016-04-14: qty 1

## 2016-04-14 MED ORDER — IOPAMIDOL (ISOVUE-300) INJECTION 61%
100.0000 mL | Freq: Once | INTRAVENOUS | Status: AC | PRN
  Administered 2016-04-14: 100 mL via INTRAVENOUS

## 2016-04-14 MED ORDER — POLYETHYLENE GLYCOL 3350 17 G PO PACK
17.0000 g | PACK | Freq: Every day | ORAL | Status: DC | PRN
  Administered 2016-04-15 – 2016-04-23 (×3): 17 g via ORAL
  Filled 2016-04-14 (×3): qty 1

## 2016-04-14 MED ORDER — ACETAMINOPHEN 650 MG RE SUPP: 650.0000 mg | Freq: Four times a day (QID) | RECTAL | Status: DC | PRN

## 2016-04-14 MED ORDER — MORPHINE SULFATE (PF) 2 MG/ML IV SOLN
1.0000 mg | INTRAVENOUS | Status: DC | PRN
  Administered 2016-04-14 – 2016-04-15 (×5): 1 mg via INTRAVENOUS
  Filled 2016-04-14 (×5): qty 1

## 2016-04-14 MED ORDER — HYDROCODONE-ACETAMINOPHEN 5-325 MG PO TABS
1.0000 | ORAL_TABLET | ORAL | Status: DC | PRN
  Administered 2016-04-14 – 2016-04-16 (×3): 2 via ORAL
  Filled 2016-04-14 (×3): qty 2

## 2016-04-14 MED ORDER — ONDANSETRON HCL 4 MG/2ML IJ SOLN
4.0000 mg | Freq: Four times a day (QID) | INTRAMUSCULAR | Status: DC | PRN
  Administered 2016-04-14 – 2016-04-25 (×2): 4 mg via INTRAVENOUS
  Filled 2016-04-14 (×2): qty 2

## 2016-04-14 MED ORDER — ONDANSETRON HCL 4 MG/2ML IJ SOLN
4.0000 mg | Freq: Once | INTRAMUSCULAR | Status: AC
  Administered 2016-04-14: 4 mg via INTRAVENOUS
  Filled 2016-04-14: qty 2

## 2016-04-14 MED ORDER — NITROGLYCERIN 0.4 MG SL SUBL
SUBLINGUAL_TABLET | SUBLINGUAL | Status: AC
  Administered 2016-04-14: 0.4 mg via SUBLINGUAL
  Filled 2016-04-14: qty 1

## 2016-04-14 MED ORDER — FAMOTIDINE 20 MG PO TABS
20.0000 mg | ORAL_TABLET | Freq: Two times a day (BID) | ORAL | Status: DC
  Administered 2016-04-14 – 2016-04-25 (×22): 20 mg via ORAL
  Filled 2016-04-14 (×23): qty 1

## 2016-04-14 MED ORDER — KCL IN DEXTROSE-NACL 40-5-0.45 MEQ/L-%-% IV SOLN
INTRAVENOUS | Status: DC
  Administered 2016-04-14: 16:00:00 via INTRAVENOUS
  Filled 2016-04-14: qty 1000

## 2016-04-14 NOTE — H&P (Signed)
[facility] [place] NAME: Dennis Perkins    MR#:  [id number]  DATE OF BIRTH:  Jan 04, 1956  DATE OF ADMISSION:  04/14/2016  PRIMARY CARE PHYSICIAN: Default, Provider, MD   REQUESTING/REFERRING PHYSICIAN: Dr. Lenise Arena  CHIEF COMPLAINT:   Chief Complaint  Patient presents with  . Emesis  . Diarrhea  . Nausea    HISTORY OF PRESENT ILLNESS:  Dennis Perkins  is a 60 y.o. male with a known history of COPD, essential hypertension, metastatic lung cancer recently diagnosed who presents to the hospital due to intractable nausea vomiting and pain on the right side of his chest. 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 He was recently discharged on MS Contin and oxycodone as needed for pain but has been having significant nausea and vomiting and has not been able to keep those medications down. He presents to the emergency room today and was noted to be severely hypokalemic with a potassium of 2.6. He is also complaining of significant pain in the right side of his chest from his malignancy about a 10 intensity. Given his electrolyte abnormalities and persistent pain hospitalist services were contacted further treatment and evaluation. Patient admits to about a 40 pound weight loss in the past 3-6 months. He admits to some night sweats, chills but no documented fever. He denies any abdominal pain but does admit to persistent nausea and vomiting which is bilious and nonbloody in nature.   PAST MEDICAL HISTORY:   Past Medical History  Diagnosis Date  . Lung cancer ([place])   . COPD (chronic obstructive pulmonary disease) ([place])   . Essential hypertension     PAST SURGICAL HISTORY:   Past Surgical History  Procedure Laterality Date  . I&d extremity Right 03/17/2013    Procedure: IRRIGATION AND DEBRIDEMENT EXTREMITY, flexor tendon repair;  Surgeon: Linna Hoff, MD;   Location: [facility];  Service: Orthopedics;  Laterality: Right;  . Knee surgery Bilateral     SOCIAL HISTORY:   Social History  Substance Use Topics  . Smoking status: Current Some Day Smoker -- 0.50 packs/day for 40 years    Types: Cigarettes  . Smokeless tobacco: Never Used  . Alcohol Use: 16.8 oz/week    28 Cans of beer per week    FAMILY HISTORY:   Family History  Problem Relation Age of Onset  . Breast cancer Mother   . Lung cancer Father     DRUG ALLERGIES:  No Known Allergies  REVIEW OF SYSTEMS:   Review of Systems  Constitutional: Positive for chills and weight loss (40 lbs over the past 3-6 months). Negative for fever.  HENT: Negative for congestion, nosebleeds and tinnitus.   Eyes: Negative for blurred vision, double vision and redness.  Respiratory: Negative for cough, hemoptysis and shortness of breath.   Cardiovascular: Negative for chest pain, orthopnea, leg swelling and PND.  Gastrointestinal: Positive for nausea and vomiting. Negative for abdominal pain, diarrhea and melena.  Genitourinary: Negative for dysuria, urgency and hematuria.  Musculoskeletal: Negative for joint pain and falls.  Neurological: Positive for weakness. Negative for dizziness, tingling, sensory change, focal weakness, seizures and headaches.  Endo/Heme/Allergies: Negative for polydipsia. Does not bruise/bleed easily.  Psychiatric/Behavioral: Negative for depression and memory loss. The patient is not nervous/anxious.     MEDICATIONS AT HOME:   Prior to Admission medications   Medication Sig Start Date End Date Taking? Authorizing  Provider  famotidine (PEPCID) 20 MG tablet Take 1 tablet (20 mg total) by mouth 2 (two) times daily. 03/27/16  Yes Loletha Grayer, MD  ibuprofen (ADVIL,MOTRIN) 200 MG tablet Take 200 mg by mouth every 6 (six) hours as needed for fever.    Yes Historical Provider, MD  metoprolol tartrate (LOPRESSOR) 25 MG tablet Take 1 tablet (25 mg total) by mouth 2 (two)  times daily. 03/27/16  Yes Loletha Grayer, MD  polyethylene glycol [facility] / GLYCOLAX) packet Take 17 g by mouth daily as needed for mild constipation or moderate constipation. 03/27/16  Yes Loletha Grayer, MD  levofloxacin (LEVAQUIN) 750 MG tablet Take 1 tablet (750 mg total) by mouth daily. Patient not taking: Reported on 04/14/2016 03/27/16   Loletha Grayer, MD  morphine (MS CONTIN) 15 MG 12 hr tablet Take 1 tablet (15 mg total) by mouth every 12 (twelve) hours. Patient not taking: Reported on 04/14/2016 03/27/16   Loletha Grayer, MD  oxyCODONE (OXY IR/ROXICODONE) 5 MG immediate release tablet Take 1 tablet (5 mg total) by mouth every 4 (four) hours as needed for moderate pain or breakthrough pain. Patient not taking: Reported on 04/14/2016 03/27/16   Loletha Grayer, MD      VITAL SIGNS:  Blood pressure 91/80, pulse 97, temperature 98.8 F (37.1 C), temperature source Oral, resp. rate 13, height '5\' 9"'$  (1.753 m), weight 65.772 kg (145 lb), SpO2 96 %.  PHYSICAL EXAMINATION:  Physical Exam  GENERAL:  60 y.o.-year-old patient lying in the bed in no acute distress.  EYES: Pupils equal, round, reactive to light and accommodation. No scleral icterus. Extraocular muscles intact.  HEENT: Head atraumatic, normocephalic. Oropharynx and nasopharynx clear. No oropharyngeal erythema, moist oral mucosa  NECK:  Supple, no jugular venous distention. No thyroid enlargement, no tenderness.  LUNGS: Normal breath sounds bilaterally, no wheezing, rales, rhonchi. No use of accessory muscles of respiration.  CARDIOVASCULAR: S1, S2 RRR tachycardic. No murmurs, rubs, gallops, clicks.  ABDOMEN: Soft, nontender, nondistended. Bowel sounds present. No organomegaly or mass.  EXTREMITIES: No pedal edema, cyanosis, or clubbing. + 2 pedal & radial pulses b/l.   NEUROLOGIC: Cranial nerves II through XII are intact. No focal Motor or sensory deficits appreciated b/l PSYCHIATRIC: The patient is alert and oriented x 3. Good  affect.  SKIN: No obvious rash, lesion, or ulcer.   LABORATORY PANEL:   CBC  Recent Labs Lab 04/14/16 1448  WBC 25.9*  HGB 13.4  HCT 39.8*  PLT 401   ------------------------------------------------------------------------------------------------------------------  Chemistries   Recent Labs Lab 04/14/16 1448  NA 130*  K 2.6*  CL 88*  CO2 29  GLUCOSE 117*  BUN 11  CREATININE 0.69  CALCIUM 8.2*  AST 82*  ALT 36  ALKPHOS 91  BILITOT 1.0   ------------------------------------------------------------------------------------------------------------------  Cardiac Enzymes No results for input(s): TROPONINI in the last 168 hours. ------------------------------------------------------------------------------------------------------------------  RADIOLOGY:  Dg Chest 2 View  04/14/2016  CLINICAL DATA:  Patient with right upper chest pain. Recent diagnosis of lung cancer. EXAM: CHEST  2 VIEW COMPARISON:  Chest radiograph 03/26/2016 ; chest CTA 03/23/2016. FINDINGS: Monitoring leads overlie the patient. Stable cardiac and mediastinal contours. Re- demonstrated pleural thickening and irregular consolidative opacities within the right upper lung. No pleural effusion or pneumothorax. Thoracic spine degenerative changes. IMPRESSION: Grossly unchanged patchy consolidation within the right upper lung with associated marked pleural thickening. Electronically Signed   By: Lovey Newcomer M.D.   On: 04/14/2016 15:30   Ct Chest W Contrast  04/14/2016  CLINICAL DATA:  History of lung cancer. EXAM: CT CHEST, ABDOMEN, AND PELVIS WITH CONTRAST TECHNIQUE: Multidetector CT imaging of the chest, abdomen and pelvis was performed following the standard protocol during bolus administration of intravenous contrast. CONTRAST:  113m ISOVUE-300 IOPAMIDOL (ISOVUE-300) INJECTION 61% COMPARISON:  Chest radiograph 04/14/2016, chest abdomen pelvis CT 03/23/2016 FINDINGS: CT CHEST FINDINGS Mediastinum/Lymph Nodes:  There is a right-sided mediastinal lymphadenopathy with necrotic appearance. Precarinal abnormal appearing lymph node measures 2.9 cm in short axis. There is also similar in appearance right hilar lymphadenopathy. The heart is normal in size. There is no significant pericardial effusion. Mild calcific atherosclerotic disease of the coronary arteries noted. Small nodules are seen along the medial right upper and middle lobe visceral pleura, likely representing seeding with metastatic disease. No evidence of central pulmonary embolus. Lungs/Pleura: There is a nodular soft tissue thickening of the upper lobe pleura, consistent with neoplastic involvement. Satellite nodules are seen along the medial fissural pleura of the right middle lobe. There is a 7 mm soft tissue nodule in the medial portion of the right upper lobe. More linear pleural thickening is seen within the posterior right lower lobe pleura. There is a small right pleural effusion. The left lung is clear other than mild dependent basilar atelectasis. No evidence of pneumothorax. Musculoskeletal: Metastatic deposit with large soft tissue component is seen associated with the T11 vertebral body causing osteal lysis of the small portion of the right hand side vertebral body, right pedicle and right transverse process. Similar in appearance partially visualized due to collimation metastatic deposit is seen involving the posterior process of T1 vertebral body CT ABDOMEN PELVIS FINDINGS Hepatobiliary: There are 3 ill-defined liver masses likely representing metastatic disease, which are too vague is to be measured accurately. Periportal edema. There is a 2.7 by 4.2 cm soft tissue mass within porta hepaticus, likely representing enlarged lymph node. Other smaller adjacent lymph nodes are seen. Borderline enlarged but rounded lymph nodes are noted within the gastrohepatic ligament, probably metastatic. Pancreas: No mass, inflammatory changes, or other significant  abnormality. Spleen: Within normal limits in size and appearance. Adrenals/Urinary Tract: There is a right adrenal soft tissue mass measuring 3.3 by 2.8 cm, probably metastatic. The left adrenal is normal. There is a left superior water density measuring circumscribed lesion, representing a cyst by CT criteria. Another less defined hypoattenuated lesion is seen in the medial upper cortex of the left kidney. Stomach/Bowel: No evidence of obstruction, inflammatory process, or abnormal fluid collections. Vascular Normal appearance of the abdominal aorta. Reproductive: No mass or other significant abnormality. Other: None. Musculoskeletal: No suspicious bone lesions identified within the abdomen or pelvis. IMPRESSION: Necrotic appearing right-sided anterior mediastinal and right hilar lymphadenopathy. Extensive nodular soft tissue thickening of the right upper lobe pleura, highly suggestive for metastatic disease with dropped nodules along the medial pleura of the right middle lobe. The previously demonstrated right upper lobe pulmonary based mass has coalesced with the metastatic pleural thickening. Small right upper lobe 7 mm soft tissue nodule, likely neoplastic. Osseous metastatic disease with significant soft tissue component involving the right hand side of T11 vertebral body, and the posterior process of T1 vertebral body. Three large vaguely defined liver masses, which likely represent metastatic disease, difficult to measure due to extremely indistinct borders. Right adrenal mass, porta hepatis and mesenteric lymphadenopathy, highly suggestive of metastatic disease, and increased in size. Indeterminate left renal lesion, which may represent a cyst. Periportal edema within the liver. Electronically Signed   By: DLinwood DibblesD.  On: 04/14/2016 18:13   Ct Abdomen Pelvis W Contrast  04/14/2016  CLINICAL DATA:  History of lung cancer. EXAM: CT CHEST, ABDOMEN, AND PELVIS WITH CONTRAST TECHNIQUE:  Multidetector CT imaging of the chest, abdomen and pelvis was performed following the standard protocol during bolus administration of intravenous contrast. CONTRAST:  1104m ISOVUE-300 IOPAMIDOL (ISOVUE-300) INJECTION 61% COMPARISON:  Chest radiograph 04/14/2016, chest abdomen pelvis CT 03/23/2016 FINDINGS: CT CHEST FINDINGS Mediastinum/Lymph Nodes: There is a right-sided mediastinal lymphadenopathy with necrotic appearance. Precarinal abnormal appearing lymph node measures 2.9 cm in short axis. There is also similar in appearance right hilar lymphadenopathy. The heart is normal in size. There is no significant pericardial effusion. Mild calcific atherosclerotic disease of the coronary arteries noted. Small nodules are seen along the medial right upper and middle lobe visceral pleura, likely representing seeding with metastatic disease. No evidence of central pulmonary embolus. Lungs/Pleura: There is a nodular soft tissue thickening of the upper lobe pleura, consistent with neoplastic involvement. Satellite nodules are seen along the medial fissural pleura of the right middle lobe. There is a 7 mm soft tissue nodule in the medial portion of the right upper lobe. More linear pleural thickening is seen within the posterior right lower lobe pleura. There is a small right pleural effusion. The left lung is clear other than mild dependent basilar atelectasis. No evidence of pneumothorax. Musculoskeletal: Metastatic deposit with large soft tissue component is seen associated with the T11 vertebral body causing osteal lysis of the small portion of the right hand side vertebral body, right pedicle and right transverse process. Similar in appearance partially visualized due to collimation metastatic deposit is seen involving the posterior process of T1 vertebral body CT ABDOMEN PELVIS FINDINGS Hepatobiliary: There are 3 ill-defined liver masses likely representing metastatic disease, which are too vague is to be measured  accurately. Periportal edema. There is a 2.7 by 4.2 cm soft tissue mass within porta hepaticus, likely representing enlarged lymph node. Other smaller adjacent lymph nodes are seen. Borderline enlarged but rounded lymph nodes are noted within the gastrohepatic ligament, probably metastatic. Pancreas: No mass, inflammatory changes, or other significant abnormality. Spleen: Within normal limits in size and appearance. Adrenals/Urinary Tract: There is a right adrenal soft tissue mass measuring 3.3 by 2.8 cm, probably metastatic. The left adrenal is normal. There is a left superior water density measuring circumscribed lesion, representing a cyst by CT criteria. Another less defined hypoattenuated lesion is seen in the medial upper cortex of the left kidney. Stomach/Bowel: No evidence of obstruction, inflammatory process, or abnormal fluid collections. Vascular Normal appearance of the abdominal aorta. Reproductive: No mass or other significant abnormality. Other: None. Musculoskeletal: No suspicious bone lesions identified within the abdomen or pelvis. IMPRESSION: Necrotic appearing right-sided anterior mediastinal and right hilar lymphadenopathy. Extensive nodular soft tissue thickening of the right upper lobe pleura, highly suggestive for metastatic disease with dropped nodules along the medial pleura of the right middle lobe. The previously demonstrated right upper lobe pulmonary based mass has coalesced with the metastatic pleural thickening. Small right upper lobe 7 mm soft tissue nodule, likely neoplastic. Osseous metastatic disease with significant soft tissue component involving the right hand side of T11 vertebral body, and the posterior process of T1 vertebral body. Three large vaguely defined liver masses, which likely represent metastatic disease, difficult to measure due to extremely indistinct borders. Right adrenal mass, porta hepatis and mesenteric lymphadenopathy, highly suggestive of metastatic  disease, and increased in size. Indeterminate left renal lesion, which may represent a  cyst. Periportal edema within the liver. Electronically Signed   By: Fidela Salisbury M.D.   On: 04/14/2016 18:13     IMPRESSION AND PLAN:   59 year old male with history of COPD, extensive metastatic lung cancer, essential hypertension presented to the hospital due to intractable nausea and vomiting and also right-sided chest pain from his malignancy.  1. Intractable nausea and vomiting-etiology unclear presently. Patient CT scan of the abdomen pelvis does show a significant extensive metastatic disease but no evidence of bowel obstruction, or biliary pathology. -This could possibly be related to his pain medications. Supportive care with IV fluids, antiemetics. -Follow clinically.  2. Severe hypokalemia-secondary to the intractable nausea vomiting. -We'll replace potassium intravenously and orally. Check magnesium level. Follow potassium.  3. Mild hyponatremia-hypovolemic in nature. -Continue IV fluids and follow sodium.  4. Right-sided chest pain-secondary to his malignancy. -Patient was not able to tolerate his MS Contin and oxycodone due to nausea vomiting. I will place the patient on a fentanyl patch, IV morphine as needed and also Norco for breakthrough pain.  5. Metastatic lung cancer-prognosis poor given the extensive disease. -I will get an oncology consult.  6. Essential hypertension-continue metoprolol.  7. Leukocytosis-etiology unclear. No evidence of postobstructive pneumonia on CAT scan of the chest, urinalysis is negative. He is currently afebrile. Possibly stress mediated. Will monitor. Hold off on antibiotics presently.    All the records are reviewed and case discussed with ED provider. Management plans discussed with the patient, family and they are in agreement.  CODE STATUS: Full  TOTAL TIME TAKING CARE OF THIS PATIENT: 45 minutes.    Henreitta Leber M.D on 04/14/2016  at 7:30 PM  Between 7am to 6pm - Pager - [phone]  After 6pm go to [email or web address] - password [email or web address] [facility]  Office  [phone]  CC: Primary care physician; Default, Provider, MD

## 2016-04-14 NOTE — ED Notes (Addendum)
Pt presents to ED via EMS from home c/o n/v/d x1 week. Diagnosed with lung cancer 03/23/16, missed follow-up appt with oncologist 6/12 because of n/v/d. Pt is to start chemo this Tuesday. Pt ambulatory per EMS, [facility] PTA. C/o back pain 10/10, pt states he has not taken MS Contin prescribed d/t nausea.

## 2016-04-14 NOTE — ED Notes (Signed)
Critical potassium reported, MD made aware.

## 2016-04-14 NOTE — ED Provider Notes (Signed)
[facility] Emergency Department Provider Note        Time seen: ----------------------------------------- 3:26 PM on 04/14/2016 -----------------------------------------    I have reviewed the triage vital signs and the nursing notes.   HISTORY  Chief Complaint Emesis; Diarrhea; and Nausea    HPI Dennis Perkins is a 60 y.o. male who presents to ER for nausea vomiting and diarrhea. Patient states he has not been able to keep his pain medicine down. He was recently diagnosed with lung cancer, also recently had a lung biopsy. Patient still complaining of pain in the right side of his upper chest and back. He states every time he takes his pain medicine and he gets really nauseous starts throwing up. This is causes pain to be much worse. He patient states he feels dehydrated.   Past Medical History  Diagnosis Date  . Lung cancer [facility])     Patient Active Problem List   Diagnosis Date Noted  . Lung cancer ([place]) 03/24/2016  . Tobacco abuse 03/24/2016  . HTN (hypertension) 03/24/2016    Past Surgical History  Procedure Laterality Date  . I&d extremity Right 03/17/2013    Procedure: IRRIGATION AND DEBRIDEMENT EXTREMITY, flexor tendon repair;  Surgeon: Linna Hoff, MD;  Location: [place];  Service: Orthopedics;  Laterality: Right;  . Knee surgery Bilateral     Allergies Review of patient's allergies indicates no known allergies.  Social History Social History  Substance Use Topics  . Smoking status: Current Some Day Smoker    Types: Cigarettes  . Smokeless tobacco: Never Used  . Alcohol Use: 16.8 oz/week    28 Cans of beer per week    Review of Systems Constitutional: Negative for fever. Cardiovascular: Negative for chest pain. Respiratory: Negative for shortness of breath. Gastrointestinal: Negative for abdominal pain, Positive for vomiting and diarrhea Genitourinary: Negative for dysuria. Musculoskeletal: Positive for thoracic back  pain Skin: Negative for rash. Neurological: Negative for headaches, focal weakness or numbness.  10-point ROS otherwise negative.  ____________________________________________   PHYSICAL EXAM:  VITAL SIGNS: ED Triage Vitals  Enc Vitals Group     BP 04/14/16 1449 147/104 mmHg     Pulse Rate 04/14/16 1449 98     Resp 04/14/16 1449 14     Temp 04/14/16 1449 98.8 F (37.1 C)     Temp Source 04/14/16 1449 Oral     SpO2 04/14/16 1449 98 %     Weight 04/14/16 1449 145 lb (65.772 kg)     Height 04/14/16 1449 '5\' 9"'$  (1.753 m)     Head Cir --      Peak Flow --      Pain Score 04/14/16 1442 10     Pain Loc --      Pain Edu? --      Excl. in [place]? --     Constitutional: Alert and oriented. No acute distress Eyes: Conjunctivae are normal. PERRL. Normal extraocular movements. ENT   Head: Normocephalic and atraumatic.   Nose: No congestion/rhinnorhea.   Mouth/Throat: Mucous membranes are moist.   Neck: No stridor. Cardiovascular: Normal rate, regular rhythm. No murmurs, rubs, or gallops. Respiratory: Normal respiratory effort without tachypnea nor retractions. Breath sounds are clear and equal bilaterally. No wheezes/rales/rhonchi. Gastrointestinal: Soft and nontender. Normal bowel sounds Musculoskeletal: Nontender with normal range of motion in all extremities. No lower extremity tenderness nor edema. Neurologic:  Normal speech and language. No gross focal neurologic deficits are appreciated.  Skin:  Skin is warm, dry  and intact. No rash noted. Psychiatric: Mood and affect are normal. Speech and behavior are normal.  ____________________________________________  EKG: Interpreted by me. Sinus tachycardia with a rate of 105 bpm, normal axis, long QT interval, no evidence of acute infarction.  ____________________________________________  ED COURSE:  Pertinent labs & imaging results that were available during my care of the patient were reviewed by me and considered in my  medical decision making (see chart for details). Patient resents to ER with nausea vomiting and diarrhea. We'll give IV fluids antiemetics. ____________________________________________    LABS (pertinent positives/negatives)  Labs Reviewed  COMPREHENSIVE METABOLIC PANEL - Abnormal; Notable for the following:    Sodium 130 (*)    Potassium 2.6 (*)    Chloride 88 (*)    Glucose, Bld 117 (*)    Calcium 8.2 (*)    Albumin 2.2 (*)    AST 82 (*)    All other components within normal limits  CBC - Abnormal; Notable for the following:    WBC 25.9 (*)    HCT 39.8 (*)    All other components within normal limits  URINALYSIS COMPLETEWITH MICROSCOPIC ([facility] ONLY) - Abnormal; Notable for the following:    Color, Urine AMBER (*)    APPearance HAZY (*)    Protein, ur 30 (*)    Squamous Epithelial / LPF 0-5 (*)    All other components within normal limits  LIPASE, BLOOD    RADIOLOGY Images were viewed by me  Chest x-ray IMPRESSION: Necrotic appearing right-sided anterior mediastinal and right hilar lymphadenopathy.  Extensive nodular soft tissue thickening of the right upper lobe pleura, highly suggestive for metastatic disease with dropped nodules along the medial pleura of the right middle lobe. The previously demonstrated right upper lobe pulmonary based mass has coalesced with the metastatic pleural thickening. Small right upper lobe 7 mm soft tissue nodule, likely neoplastic.  Osseous metastatic disease with significant soft tissue component involving the right hand side of T11 vertebral body, and the posterior process of T1 vertebral body.  Three large vaguely defined liver masses, which likely represent metastatic disease, difficult to measure due to extremely indistinct borders.  Right adrenal mass, porta hepatis and mesenteric lymphadenopathy, highly suggestive of metastatic disease, and increased in size.  Indeterminate left renal lesion, which may represent a  cyst.  Periportal edema within the liver. ____________________________________________  FINAL ASSESSMENT AND PLAN  Vomiting, diarrhea, Hypokalemia, leukocytosis, Metastatic cancer  Plan: Patient with labs and imaging as dictated above. Patient presents to ER with persistent pain and poor by mouth tolerance. He has received IV fluids and antiemetics. I will prescribe antibiotics to cover for lung infection. He is stable for admission at this time. Also found to have hypokalemia and has been started on IV potassium.   Earleen Newport, MD   Note: This dictation was prepared with Dragon dictation. Any transcriptional errors that result from this process are unintentional   Earleen Newport, MD 04/14/16 1820

## 2016-04-15 LAB — BASIC METABOLIC PANEL
Anion gap: 9 (ref 5–15)
BUN: 10 mg/dL (ref 6–20)
CALCIUM: 7.6 mg/dL — AB (ref 8.9–10.3)
CHLORIDE: 97 mmol/L — AB (ref 101–111)
CO2: 28 mmol/L (ref 22–32)
CREATININE: 0.59 mg/dL — AB (ref 0.61–1.24)
GFR calc Af Amer: >60 mL/min (ref >60)
Glucose, Bld: 95 mg/dL (ref 65–99)
Potassium: 3 mmol/L — ABNORMAL LOW (ref 3.5–5.1)
SODIUM: 134 mmol/L — AB (ref 135–145)

## 2016-04-15 LAB — CBC
HCT: 34.4 % — ABNORMAL LOW (ref 40.0–52.0)
Hemoglobin: 11.6 g/dL — ABNORMAL LOW (ref 13.0–18.0)
MCH: 28.1 pg (ref 26.0–34.0)
MCHC: 33.7 g/dL (ref 32.0–36.0)
MCV: 83.2 fL (ref 80.0–100.0)
PLATELETS: 363 10*3/uL (ref 150–440)
RBC: 4.14 MIL/uL — ABNORMAL LOW (ref 4.40–5.90)
RDW: 14 % (ref 11.5–14.5)
WBC: 22.8 10*3/uL — AB (ref 3.8–10.6)

## 2016-04-15 MED ORDER — FENTANYL 25 MCG/HR TD PT72: 37.5000 ug | MEDICATED_PATCH | TRANSDERMAL | Status: DC

## 2016-04-15 MED ORDER — SENNOSIDES-DOCUSATE SODIUM 8.6-50 MG PO TABS
2.0000 | ORAL_TABLET | Freq: Every day | ORAL | Status: DC
  Administered 2016-04-15 – 2016-04-25 (×8): 2 via ORAL
  Filled 2016-04-15 (×11): qty 2

## 2016-04-15 MED ORDER — POTASSIUM CHLORIDE CRYS ER 20 MEQ PO TBCR
40.0000 meq | EXTENDED_RELEASE_TABLET | Freq: Once | ORAL | Status: AC
  Administered 2016-04-15: 15:00:00 40 meq via ORAL
  Filled 2016-04-15: qty 2

## 2016-04-15 MED ORDER — MAGNESIUM SULFATE 2 GM/50ML IV SOLN
2.0000 g | Freq: Once | INTRAVENOUS | Status: AC
  Administered 2016-04-15: 2 g via INTRAVENOUS
  Filled 2016-04-15: qty 50

## 2016-04-15 NOTE — Plan of Care (Signed)
Problem: Safety: Goal: Ability to remain free from injury will improve Outcome: Progressing Pt up and about in room  And hall ambulating  Well. Steady gait.  Problem: Pain Managment: Goal: General experience of comfort will improve Outcome: Progressing Pt cont to have pain  Rt side of back reporting aching / throbbing releif noted  When med given per pt  And lets him sleep for awhile.  Problem: Nutrition: Goal: Adequate nutrition will be maintained Outcome: Progressing No c/o n/v today. Cont on clear liqs and tol well

## 2016-04-15 NOTE — Progress Notes (Signed)
[place] [facility] [place] NAME: Dennis Perkins    MR#:  [id number]  DATE OF BIRTH:  09-18-1956  SUBJECTIVE:  CHIEF COMPLAINT:   Chief Complaint  Patient presents with  . Emesis  . Diarrhea  . Nausea   - Patient diagnosed with non-small cell lung cancer about 2 weeks ago, admitted with nausea vomiting. -Has significant right-sided chest pain and upper abdominal pain. Nausea and vomiting have improved  REVIEW OF SYSTEMS:  Review of Systems  Constitutional: Positive for malaise/fatigue. Negative for fever and chills.  HENT: Negative for congestion, ear discharge and nosebleeds.   Eyes: Negative for blurred vision and double vision.  Respiratory: Negative for cough, shortness of breath and wheezing.   Cardiovascular: Positive for chest pain. Negative for palpitations.  Gastrointestinal: Positive for nausea and abdominal pain. Negative for vomiting, diarrhea and constipation.  Genitourinary: Positive for urgency. Negative for dysuria.  Musculoskeletal: Positive for myalgias and joint pain.  Neurological: Negative for dizziness, sensory change, speech change, focal weakness, seizures and headaches.  Psychiatric/Behavioral: Negative for depression.    DRUG ALLERGIES:   Allergies  Allergen Reactions  . Levaquin [Levofloxacin] Other (See Comments)    confusion    VITALS:  Blood pressure 125/78, pulse 87, temperature 98.6 F (37 C), temperature source Oral, resp. rate 20, height '5\' 9"'$  (1.753 m), weight 65.772 kg (145 lb), SpO2 99 %.  PHYSICAL EXAMINATION:  Physical Exam  GENERAL:  60 y.o.-year-old patient lying in the bed with no acute distress.  EYES: Pupils equal, round, reactive to light and accommodation. No scleral icterus. Extraocular muscles intact.  HEENT: Head atraumatic, normocephalic. Oropharynx and nasopharynx clear.  NECK:  Supple, no jugular venous distention. No thyroid enlargement, no tenderness.  LUNGS: Normal  breath sounds bilaterally, no wheezing, rales,rhonchi or crepitation. No use of accessory muscles of respiration.  CARDIOVASCULAR: S1, S2 normal. No murmurs, rubs, or gallops.  ABDOMEN: Soft, nontender, nondistended. Bowel sounds present. No organomegaly or mass.  EXTREMITIES: No pedal edema, cyanosis, or clubbing.  NEUROLOGIC: Cranial nerves II through XII are intact. Muscle strength 5/5 in all extremities. Sensation intact. Gait not checked.  PSYCHIATRIC: The patient is alert and oriented x 3.  SKIN: No obvious rash, lesion, or ulcer.    LABORATORY PANEL:   CBC  Recent Labs Lab 04/15/16 0457  WBC 22.8*  HGB 11.6*  HCT 34.4*  PLT 363   ------------------------------------------------------------------------------------------------------------------  Chemistries   Recent Labs Lab 04/14/16 1448 04/15/16 0457  NA 130* 134*  K 2.6* 3.0*  CL 88* 97*  CO2 29 28  GLUCOSE 117* 95  BUN 11 10  CREATININE 0.69 0.59*  CALCIUM 8.2* 7.6*  MG 1.6*  --   AST 82*  --   ALT 36  --   ALKPHOS 91  --   BILITOT 1.0  --    ------------------------------------------------------------------------------------------------------------------  Cardiac Enzymes No results for input(s): TROPONINI in the last 168 hours. ------------------------------------------------------------------------------------------------------------------  RADIOLOGY:  Dg Chest 2 View  04/14/2016  CLINICAL DATA:  Patient with right upper chest pain. Recent diagnosis of lung cancer. EXAM: CHEST  2 VIEW COMPARISON:  Chest radiograph 03/26/2016 ; chest CTA 03/23/2016. FINDINGS: Monitoring leads overlie the patient. Stable cardiac and mediastinal contours. Re- demonstrated pleural thickening and irregular consolidative opacities within the right upper lung. No pleural effusion or pneumothorax. Thoracic spine degenerative changes. IMPRESSION: Grossly unchanged patchy consolidation within the right upper lung with associated  marked pleural thickening. Electronically Signed   By: Dian Situ  Rosana Hoes M.D.   On: 04/14/2016 15:30   Ct Chest W Contrast  04/14/2016  CLINICAL DATA:  History of lung cancer. EXAM: CT CHEST, ABDOMEN, AND PELVIS WITH CONTRAST TECHNIQUE: Multidetector CT imaging of the chest, abdomen and pelvis was performed following the standard protocol during bolus administration of intravenous contrast. CONTRAST:  113m ISOVUE-300 IOPAMIDOL (ISOVUE-300) INJECTION 61% COMPARISON:  Chest radiograph 04/14/2016, chest abdomen pelvis CT 03/23/2016 FINDINGS: CT CHEST FINDINGS Mediastinum/Lymph Nodes: There is a right-sided mediastinal lymphadenopathy with necrotic appearance. Precarinal abnormal appearing lymph node measures 2.9 cm in short axis. There is also similar in appearance right hilar lymphadenopathy. The heart is normal in size. There is no significant pericardial effusion. Mild calcific atherosclerotic disease of the coronary arteries noted. Small nodules are seen along the medial right upper and middle lobe visceral pleura, likely representing seeding with metastatic disease. No evidence of central pulmonary embolus. Lungs/Pleura: There is a nodular soft tissue thickening of the upper lobe pleura, consistent with neoplastic involvement. Satellite nodules are seen along the medial fissural pleura of the right middle lobe. There is a 7 mm soft tissue nodule in the medial portion of the right upper lobe. More linear pleural thickening is seen within the posterior right lower lobe pleura. There is a small right pleural effusion. The left lung is clear other than mild dependent basilar atelectasis. No evidence of pneumothorax. Musculoskeletal: Metastatic deposit with large soft tissue component is seen associated with the T11 vertebral body causing osteal lysis of the small portion of the right hand side vertebral body, right pedicle and right transverse process. Similar in appearance partially visualized due to collimation  metastatic deposit is seen involving the posterior process of T1 vertebral body CT ABDOMEN PELVIS FINDINGS Hepatobiliary: There are 3 ill-defined liver masses likely representing metastatic disease, which are too vague is to be measured accurately. Periportal edema. There is a 2.7 by 4.2 cm soft tissue mass within porta hepaticus, likely representing enlarged lymph node. Other smaller adjacent lymph nodes are seen. Borderline enlarged but rounded lymph nodes are noted within the gastrohepatic ligament, probably metastatic. Pancreas: No mass, inflammatory changes, or other significant abnormality. Spleen: Within normal limits in size and appearance. Adrenals/Urinary Tract: There is a right adrenal soft tissue mass measuring 3.3 by 2.8 cm, probably metastatic. The left adrenal is normal. There is a left superior water density measuring circumscribed lesion, representing a cyst by CT criteria. Another less defined hypoattenuated lesion is seen in the medial upper cortex of the left kidney. Stomach/Bowel: No evidence of obstruction, inflammatory process, or abnormal fluid collections. Vascular Normal appearance of the abdominal aorta. Reproductive: No mass or other significant abnormality. Other: None. Musculoskeletal: No suspicious bone lesions identified within the abdomen or pelvis. IMPRESSION: Necrotic appearing right-sided anterior mediastinal and right hilar lymphadenopathy. Extensive nodular soft tissue thickening of the right upper lobe pleura, highly suggestive for metastatic disease with dropped nodules along the medial pleura of the right middle lobe. The previously demonstrated right upper lobe pulmonary based mass has coalesced with the metastatic pleural thickening. Small right upper lobe 7 mm soft tissue nodule, likely neoplastic. Osseous metastatic disease with significant soft tissue component involving the right hand side of T11 vertebral body, and the posterior process of T1 vertebral body. Three  large vaguely defined liver masses, which likely represent metastatic disease, difficult to measure due to extremely indistinct borders. Right adrenal mass, porta hepatis and mesenteric lymphadenopathy, highly suggestive of metastatic disease, and increased in size. Indeterminate left renal lesion, which may  represent a cyst. Periportal edema within the liver. Electronically Signed   By: Fidela Salisbury M.D.   On: 04/14/2016 18:13   Ct Abdomen Pelvis W Contrast  04/14/2016  CLINICAL DATA:  History of lung cancer. EXAM: CT CHEST, ABDOMEN, AND PELVIS WITH CONTRAST TECHNIQUE: Multidetector CT imaging of the chest, abdomen and pelvis was performed following the standard protocol during bolus administration of intravenous contrast. CONTRAST:  115m ISOVUE-300 IOPAMIDOL (ISOVUE-300) INJECTION 61% COMPARISON:  Chest radiograph 04/14/2016, chest abdomen pelvis CT 03/23/2016 FINDINGS: CT CHEST FINDINGS Mediastinum/Lymph Nodes: There is a right-sided mediastinal lymphadenopathy with necrotic appearance. Precarinal abnormal appearing lymph node measures 2.9 cm in short axis. There is also similar in appearance right hilar lymphadenopathy. The heart is normal in size. There is no significant pericardial effusion. Mild calcific atherosclerotic disease of the coronary arteries noted. Small nodules are seen along the medial right upper and middle lobe visceral pleura, likely representing seeding with metastatic disease. No evidence of central pulmonary embolus. Lungs/Pleura: There is a nodular soft tissue thickening of the upper lobe pleura, consistent with neoplastic involvement. Satellite nodules are seen along the medial fissural pleura of the right middle lobe. There is a 7 mm soft tissue nodule in the medial portion of the right upper lobe. More linear pleural thickening is seen within the posterior right lower lobe pleura. There is a small right pleural effusion. The left lung is clear other than mild dependent  basilar atelectasis. No evidence of pneumothorax. Musculoskeletal: Metastatic deposit with large soft tissue component is seen associated with the T11 vertebral body causing osteal lysis of the small portion of the right hand side vertebral body, right pedicle and right transverse process. Similar in appearance partially visualized due to collimation metastatic deposit is seen involving the posterior process of T1 vertebral body CT ABDOMEN PELVIS FINDINGS Hepatobiliary: There are 3 ill-defined liver masses likely representing metastatic disease, which are too vague is to be measured accurately. Periportal edema. There is a 2.7 by 4.2 cm soft tissue mass within porta hepaticus, likely representing enlarged lymph node. Other smaller adjacent lymph nodes are seen. Borderline enlarged but rounded lymph nodes are noted within the gastrohepatic ligament, probably metastatic. Pancreas: No mass, inflammatory changes, or other significant abnormality. Spleen: Within normal limits in size and appearance. Adrenals/Urinary Tract: There is a right adrenal soft tissue mass measuring 3.3 by 2.8 cm, probably metastatic. The left adrenal is normal. There is a left superior water density measuring circumscribed lesion, representing a cyst by CT criteria. Another less defined hypoattenuated lesion is seen in the medial upper cortex of the left kidney. Stomach/Bowel: No evidence of obstruction, inflammatory process, or abnormal fluid collections. Vascular Normal appearance of the abdominal aorta. Reproductive: No mass or other significant abnormality. Other: None. Musculoskeletal: No suspicious bone lesions identified within the abdomen or pelvis. IMPRESSION: Necrotic appearing right-sided anterior mediastinal and right hilar lymphadenopathy. Extensive nodular soft tissue thickening of the right upper lobe pleura, highly suggestive for metastatic disease with dropped nodules along the medial pleura of the right middle lobe. The  previously demonstrated right upper lobe pulmonary based mass has coalesced with the metastatic pleural thickening. Small right upper lobe 7 mm soft tissue nodule, likely neoplastic. Osseous metastatic disease with significant soft tissue component involving the right hand side of T11 vertebral body, and the posterior process of T1 vertebral body. Three large vaguely defined liver masses, which likely represent metastatic disease, difficult to measure due to extremely indistinct borders. Right adrenal mass, porta hepatis and mesenteric  lymphadenopathy, highly suggestive of metastatic disease, and increased in size. Indeterminate left renal lesion, which may represent a cyst. Periportal edema within the liver. Electronically Signed   By: Fidela Salisbury M.D.   On: 04/14/2016 18:13    EKG:   Orders placed or performed during the hospital encounter of 04/14/16  . EKG 12-Lead  . EKG 12-Lead    ASSESSMENT AND PLAN:   60 year old male with history of COPD, Non-small cell lung cancer with extensive metastatic disease diagnosed at the end of May 2017, essential hypertension presented to the hospital due to intractable nausea and vomiting and also right-sided chest pain from his malignancy.  1. Intractable nausea and vomiting- improving. Continue IV fluids. -Tolerating clear liquids. Advance to soft diet today  CT scan of the abdomen pelvis consistent with significant extensive metastatic disease but no evidence of bowel obstruction, or biliary pathology.  2. Severe hypokalemia and hypomagnesemia-replaced appropriately. Recheck tomorrow  3. Right-sided chest pain and right upper quadrant abdominal pain-extensive metastatic disease from his lung cancer which is on the right side. Also bone metastases and liver metastases present. -Increased fentanyl patch. Also continue oral pain medications and IV pain medications as needed.  4. Non-small cell lung cancer-diagnosed at the end of May 2017. Extensive  metastatic disease noted as well. MRI of the brain is negative. -Oncology consulted. Supposed to be starting on his first dose of chemotherapy tomorrow.  5. Essential hypertension-continue metoprolol.  6. Leukocytosis-etiology unclear. No evidence of infection identified yet. Hold off on antibiotics at this time.  7. DVT prophylaxis-on Lovenox   All the records are reviewed and case discussed with Care Management/Social Workerr. Management plans discussed with the patient, family and they are in agreement.  CODE STATUS: Full Code  TOTAL TIME TAKING CARE OF THIS PATIENT: 37 minutes.   POSSIBLE D/C IN 2 DAYS, DEPENDING ON CLINICAL CONDITION.   Gladstone Lighter M.D on 04/15/2016 at 2:06 PM  Between 7am to 6pm - Pager - [phone]  After 6pm go to [email or web address] - password [email or web address] [facility]  Office  [phone]  CC: Primary care physician; Default, Provider, MD

## 2016-04-16 DIAGNOSIS — J188 Other pneumonia, unspecified organism: Secondary | ICD-10-CM | POA: Diagnosis present

## 2016-04-16 DIAGNOSIS — C3411 Malignant neoplasm of upper lobe, right bronchus or lung: Secondary | ICD-10-CM | POA: Diagnosis present

## 2016-04-16 DIAGNOSIS — G893 Neoplasm related pain (acute) (chronic): Secondary | ICD-10-CM | POA: Diagnosis present

## 2016-04-16 DIAGNOSIS — Z803 Family history of malignant neoplasm of breast: Secondary | ICD-10-CM

## 2016-04-16 DIAGNOSIS — E43 Unspecified severe protein-calorie malnutrition: Secondary | ICD-10-CM | POA: Insufficient documentation

## 2016-04-16 DIAGNOSIS — Z515 Encounter for palliative care: Secondary | ICD-10-CM | POA: Diagnosis not present

## 2016-04-16 DIAGNOSIS — C7951 Secondary malignant neoplasm of bone: Secondary | ICD-10-CM

## 2016-04-16 DIAGNOSIS — J449 Chronic obstructive pulmonary disease, unspecified: Secondary | ICD-10-CM

## 2016-04-16 DIAGNOSIS — G92 Toxic encephalopathy: Secondary | ICD-10-CM | POA: Diagnosis not present

## 2016-04-16 DIAGNOSIS — T404X5A Adverse effect of other synthetic narcotics, initial encounter: Secondary | ICD-10-CM | POA: Diagnosis not present

## 2016-04-16 DIAGNOSIS — R634 Abnormal weight loss: Secondary | ICD-10-CM | POA: Diagnosis present

## 2016-04-16 DIAGNOSIS — I10 Essential (primary) hypertension: Secondary | ICD-10-CM

## 2016-04-16 DIAGNOSIS — I82B11 Acute embolism and thrombosis of right subclavian vein: Secondary | ICD-10-CM | POA: Diagnosis present

## 2016-04-16 DIAGNOSIS — Z801 Family history of malignant neoplasm of trachea, bronchus and lung: Secondary | ICD-10-CM

## 2016-04-16 DIAGNOSIS — Z66 Do not resuscitate: Secondary | ICD-10-CM | POA: Diagnosis not present

## 2016-04-16 DIAGNOSIS — C3490 Malignant neoplasm of unspecified part of unspecified bronchus or lung: Secondary | ICD-10-CM

## 2016-04-16 DIAGNOSIS — F17210 Nicotine dependence, cigarettes, uncomplicated: Secondary | ICD-10-CM

## 2016-04-16 DIAGNOSIS — J189 Pneumonia, unspecified organism: Secondary | ICD-10-CM | POA: Diagnosis present

## 2016-04-16 DIAGNOSIS — R680 Hypothermia, not associated with low environmental temperature: Secondary | ICD-10-CM | POA: Diagnosis present

## 2016-04-16 DIAGNOSIS — C787 Secondary malignant neoplasm of liver and intrahepatic bile duct: Secondary | ICD-10-CM | POA: Diagnosis present

## 2016-04-16 DIAGNOSIS — R112 Nausea with vomiting, unspecified: Secondary | ICD-10-CM

## 2016-04-16 DIAGNOSIS — R630 Anorexia: Secondary | ICD-10-CM

## 2016-04-16 DIAGNOSIS — E871 Hypo-osmolality and hyponatremia: Secondary | ICD-10-CM | POA: Diagnosis present

## 2016-04-16 DIAGNOSIS — Z6821 Body mass index (BMI) 21.0-21.9, adult: Secondary | ICD-10-CM | POA: Diagnosis not present

## 2016-04-16 DIAGNOSIS — E861 Hypovolemia: Secondary | ICD-10-CM | POA: Diagnosis present

## 2016-04-16 DIAGNOSIS — R4182 Altered mental status, unspecified: Secondary | ICD-10-CM | POA: Diagnosis not present

## 2016-04-16 DIAGNOSIS — E876 Hypokalemia: Secondary | ICD-10-CM | POA: Diagnosis present

## 2016-04-16 DIAGNOSIS — J440 Chronic obstructive pulmonary disease with acute lower respiratory infection: Secondary | ICD-10-CM | POA: Diagnosis present

## 2016-04-16 DIAGNOSIS — Y92009 Unspecified place in unspecified non-institutional (private) residence as the place of occurrence of the external cause: Secondary | ICD-10-CM | POA: Diagnosis not present

## 2016-04-16 DIAGNOSIS — R197 Diarrhea, unspecified: Secondary | ICD-10-CM | POA: Diagnosis present

## 2016-04-16 LAB — BASIC METABOLIC PANEL
ANION GAP: 9 (ref 5–15)
BUN: 8 mg/dL (ref 6–20)
CALCIUM: 7.6 mg/dL — AB (ref 8.9–10.3)
CO2: 24 mmol/L (ref 22–32)
CREATININE: 0.58 mg/dL — AB (ref 0.61–1.24)
Chloride: 103 mmol/L (ref 101–111)
GLUCOSE: 93 mg/dL (ref 65–99)
Potassium: 3.9 mmol/L (ref 3.5–5.1)
Sodium: 136 mmol/L (ref 135–145)

## 2016-04-16 LAB — CBC
HCT: 34.2 % — ABNORMAL LOW (ref 40.0–52.0)
Hemoglobin: 11.6 g/dL — ABNORMAL LOW (ref 13.0–18.0)
MCH: 28.3 pg (ref 26.0–34.0)
MCHC: 33.8 g/dL (ref 32.0–36.0)
MCV: 83.6 fL (ref 80.0–100.0)
PLATELETS: 386 10*3/uL (ref 150–440)
RBC: 4.1 MIL/uL — ABNORMAL LOW (ref 4.40–5.90)
RDW: 14.7 % — AB (ref 11.5–14.5)
WBC: 21.2 10*3/uL — AB (ref 3.8–10.6)

## 2016-04-16 LAB — MAGNESIUM: MAGNESIUM: 1.7 mg/dL (ref 1.7–2.4)

## 2016-04-16 MED ORDER — FENTANYL 50 MCG/HR TD PT72
50.0000 ug | MEDICATED_PATCH | TRANSDERMAL | Status: DC
  Administered 2016-04-16: 13:00:00 50 ug via TRANSDERMAL
  Filled 2016-04-16: qty 1

## 2016-04-16 MED ORDER — DEXTROSE 5 % IV SOLN
500.0000 mg | INTRAVENOUS | Status: DC
  Administered 2016-04-16 – 2016-04-17 (×2): 500 mg via INTRAVENOUS
  Filled 2016-04-16 (×3): qty 500

## 2016-04-16 MED ORDER — HYDROCODONE-ACETAMINOPHEN 10-325 MG PO TABS
1.0000 | ORAL_TABLET | ORAL | Status: DC | PRN
  Administered 2016-04-16 – 2016-04-21 (×9): 1 via ORAL
  Filled 2016-04-16 (×10): qty 1

## 2016-04-16 MED ORDER — MAGNESIUM HYDROXIDE 400 MG/5ML PO SUSP
30.0000 mL | Freq: Every day | ORAL | Status: AC | PRN
  Administered 2016-04-16 – 2016-04-19 (×2): 30 mL via ORAL
  Filled 2016-04-16 (×2): qty 30

## 2016-04-16 MED ORDER — MAGNESIUM SULFATE 2 GM/50ML IV SOLN
2.0000 g | Freq: Once | INTRAVENOUS | Status: AC
  Administered 2016-04-16: 2 g via INTRAVENOUS
  Filled 2016-04-16: qty 50

## 2016-04-16 MED ORDER — ENSURE ENLIVE PO LIQD
237.0000 mL | Freq: Two times a day (BID) | ORAL | Status: DC
  Administered 2016-04-16 – 2016-04-22 (×11): 237 mL via ORAL

## 2016-04-16 MED ORDER — DEXTROSE 5 % IV SOLN
1.0000 g | INTRAVENOUS | Status: DC
  Administered 2016-04-16 – 2016-04-17 (×2): 1 g via INTRAVENOUS
  Filled 2016-04-16 (×3): qty 10

## 2016-04-16 NOTE — Progress Notes (Signed)
Pharmacy Antibiotic Note  Dennis Perkins is a 60 y.o. male admitted on 04/14/2016 now with pneumonia.  Pharmacy has been consulted for Ceftriaxone dosing.  Plan: Ceftriaxone 1 g IV q24h MD has also ordered azithromycin 500 mg IV q24h  Height: '5\' 9"'$  (175.3 cm) Weight: 145 lb (65.772 kg) IBW/kg (Calculated) : 70.7  Temp (24hrs), Avg:98.9 F (37.2 C), Min:97.8 F (36.6 C), Max:100.8 F (38.2 C)   Recent Labs Lab 04/14/16 1448 04/15/16 0457 04/16/16 0549  WBC 25.9* 22.8* 21.2*  CREATININE 0.69 0.59* 0.58*    Estimated Creatinine Clearance: 92.5 mL/min (by C-G formula based on Cr of 0.58).    Allergies  Allergen Reactions  . Levaquin [Levofloxacin] Other (See Comments)    confusion      Thank you for allowing pharmacy to be a part of this patient's care.  Rocky Morel 04/16/2016 12:09 PM

## 2016-04-16 NOTE — Consult Note (Signed)
[name]  [phone] [phone]  ID: Dennis Perkins OB: August 03, 1956  MR#: [id number]  YSA#:[id number]  Patient Care Team: Provider Default, MD as PCP - General  CHIEF COMPLAINT:  Chief Complaint  Patient presents with  . Emesis  . Diarrhea  . 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 Prior to his initial appointment in the [facility], patient was readmitted to the hospital with pain and intractable nausea and vomiting. Symptoms are improved, but he is not back to baseline. He offers no other complaints. He has no neurologic complaints. He denies any recent fevers or illnesses. He has a poor appetite and denies weight loss. He denies any cough, shortness of breath, or hemoptysis. He denies any nausea, vomiting, constipation, or diarrhea. He has no urinary complaints. Patient otherwise feels well and offers no further specific complaints.  REVIEW OF SYSTEMS:   Review of Systems  Constitutional: Negative.  Negative for fever, weight loss and malaise/fatigue.  Respiratory: Negative.  Negative for cough, hemoptysis and shortness of breath.   Cardiovascular: Positive for chest pain. Negative for leg swelling.  Gastrointestinal: Positive for nausea and vomiting. Negative for abdominal pain.  Genitourinary: Negative.   Musculoskeletal: Negative.   Neurological: Negative.  Negative for weakness.  Psychiatric/Behavioral: Negative.     As per HPI. Otherwise, a complete review of systems is negatve.  PAST MEDICAL HISTORY: Past Medical History  Diagnosis Date  . Lung cancer ([place])   . COPD (chronic obstructive pulmonary disease) ([place])   . Essential hypertension     PAST SURGICAL HISTORY: Past Surgical History  Procedure Laterality Date  . I&d extremity Right 03/17/2013    Procedure: IRRIGATION  AND DEBRIDEMENT EXTREMITY, flexor tendon repair;  Surgeon: Linna Hoff, MD;  Location: [facility];  Service: Orthopedics;  Laterality: Right;  . Knee surgery Bilateral     FAMILY HISTORY Family History  Problem Relation Age of Onset  . Breast cancer Mother   . Lung cancer Father        ADVANCED DIRECTIVES:    HEALTH MAINTENANCE: Social History  Substance Use Topics  . Smoking status: Current Some Day Smoker -- 0.50 packs/day for 40 years    Types: Cigarettes  . Smokeless tobacco: Never Used  . Alcohol Use: 16.8 oz/week    28 Cans of beer per week     Colonoscopy:  PAP:  Bone density:  Lipid panel:  Allergies  Allergen Reactions  . Levaquin [Levofloxacin] Other (See Comments)    confusion    Current Facility-Administered Medications  Medication Dose Route Frequency Provider Last Rate Last Dose  . 0.9 % NaCl with KCl 20 mEq/ L  infusion   Intravenous Continuous Henreitta Leber, MD 100 mL/hr at 04/16/16 1243    . acetaminophen (TYLENOL) tablet 650 mg  650 mg Oral Q6H PRN Henreitta Leber, MD       Or  . acetaminophen (TYLENOL) suppository 650 mg  650 mg Rectal Q6H PRN Henreitta Leber, MD      . azithromycin (ZITHROMAX) 500 mg in dextrose 5 % 250 mL IVPB  500 mg Intravenous Q24H Gladstone Lighter, MD   500 mg at 04/16/16 1243  . cefTRIAXone (ROCEPHIN) 1 g in dextrose 5 % 50 mL IVPB  1 g Intravenous Q24H Gladstone Lighter, MD   1 g at 04/16/16 1249  .  enoxaparin (LOVENOX) injection 40 mg  40 mg Subcutaneous Q24H Henreitta Leber, MD   40 mg at 04/15/16 2039  . famotidine (PEPCID) tablet 20 mg  20 mg Oral BID Henreitta Leber, MD   20 mg at 04/16/16 0931  . feeding supplement (ENSURE ENLIVE) (ENSURE ENLIVE) liquid 237 mL  237 mL Oral BID BM Gladstone Lighter, MD   237 mL at 04/16/16 1233  . fentaNYL (DURAGESIC - dosed mcg/hr) 50 mcg  50 mcg Transdermal Q72H Gladstone Lighter, MD   50 mcg at 04/16/16 1240  . HYDROcodone-acetaminophen (NORCO) 10-325 MG per tablet 1 tablet  1 tablet  Oral Q4H PRN Gladstone Lighter, MD      . magnesium hydroxide (MILK OF MAGNESIA) suspension 30 mL  30 mL Oral Daily PRN Gladstone Lighter, MD   30 mL at 04/16/16 0931  . metoprolol tartrate (LOPRESSOR) tablet 25 mg  25 mg Oral BID Henreitta Leber, MD   25 mg at 04/16/16 0931  . nitroGLYCERIN (NITROSTAT) SL tablet 0.4 mg  0.4 mg Sublingual Q5 min PRN Max Sane, MD   0.4 mg at 04/14/16 2348  . ondansetron (ZOFRAN) tablet 4 mg  4 mg Oral Q6H PRN Henreitta Leber, MD       Or  . ondansetron [facility]) injection 4 mg  4 mg Intravenous Q6H PRN Henreitta Leber, MD   4 mg at 04/14/16 2141  . polyethylene glycol (MIRALAX / GLYCOLAX) packet 17 g  17 g Oral Daily PRN Henreitta Leber, MD   17 g at 04/16/16 0931  . senna-docusate (Senokot-S) tablet 2 tablet  2 tablet Oral QHS Gladstone Lighter, MD   2 tablet at 04/15/16 2039    OBJECTIVE: Filed Vitals:   04/16/16 0534 04/16/16 1119  BP: 147/93 120/84  Pulse: 112 80  Temp: 100.8 F (38.2 C) 97.8 F (36.6 C)  Resp: 20 20     Body mass index is 21.4 kg/(m^2).    ECOG FS:1 - Symptomatic but completely ambulatory  General: Well-developed, well-nourished, no acute distress. Eyes: Pink conjunctiva, anicteric sclera. HEENT: Normocephalic, moist mucous membranes, clear oropharnyx. Lungs: Clear to auscultation bilaterally. Heart: Regular rate and rhythm. No rubs, murmurs, or gallops. Abdomen: Soft, nontender, nondistended. No organomegaly noted, normoactive bowel sounds. Musculoskeletal: No edema, cyanosis, or clubbing. Neuro: Alert, answering all questions appropriately. Cranial nerves grossly intact. Skin: No rashes or petechiae noted. Psych: Normal affect. Lymphatics: No cervical, calvicular, axillary or inguinal LAD.   LAB RESULTS:  Lab Results  Component Value Date   NA 136 04/16/2016   K 3.9 04/16/2016   CL 103 04/16/2016   CO2 24 04/16/2016   GLUCOSE 93 04/16/2016   BUN 8 04/16/2016   CREATININE 0.58* 04/16/2016   CALCIUM 7.6* 04/16/2016     PROT 7.3 04/14/2016   ALBUMIN 2.2* 04/14/2016   AST 82* 04/14/2016   ALT 36 04/14/2016   ALKPHOS 91 04/14/2016   BILITOT 1.0 04/14/2016   GFRNONAA >60 04/16/2016   GFRAA >60 04/16/2016    Lab Results  Component Value Date   WBC 21.2* 04/16/2016   NEUTROABS 7.9* 03/23/2016   HGB 11.6* 04/16/2016   HCT 34.2* 04/16/2016   MCV 83.6 04/16/2016   PLT 386 04/16/2016     STUDIES: Dg Chest 1 View  03/26/2016  CLINICAL DATA:  60 year old male with a history of biopsy pleural lesion EXAM: CHEST 1 VIEW COMPARISON:  Chest CT 03/23/2016, chest x-ray 03/23/2016 FINDINGS: Cardiomediastinal silhouette unchanged in size and contour. Pleural parenchymal  thickening on the right, predominantly apical. Fullness in the right hilar region. No pneumothorax or pleural effusion. Left lung relatively well aerated. No displaced fracture IMPRESSION: Status post right-sided pleural mass biopsy with no complicating features. Signed, Dulcy Fanny. Earleen Newport, DO [facility] Radiology Electronically Signed   By: Corrie Mckusick D.O.   On: 03/26/2016 15:55   Dg Chest 2 View  04/14/2016  CLINICAL DATA:  Patient with right upper chest pain. Recent diagnosis of lung cancer. EXAM: CHEST  2 VIEW COMPARISON:  Chest radiograph 03/26/2016 ; chest CTA 03/23/2016. FINDINGS: Monitoring leads overlie the patient. Stable cardiac and mediastinal contours. Re- demonstrated pleural thickening and irregular consolidative opacities within the right upper lung. No pleural effusion or pneumothorax. Thoracic spine degenerative changes. IMPRESSION: Grossly unchanged patchy consolidation within the right upper lung with associated marked pleural thickening. Electronically Signed   By: Lovey Newcomer M.D.   On: 04/14/2016 15:30   Dg Chest 2 View  03/23/2016  CLINICAL DATA:  Right abdominal/back pain. Right pleural fluid on CT abdomen study from earlier today. EXAM: CHEST  2 VIEW COMPARISON:  03/23/2016  unenhanced CT abdomen/ pelvis. 02/26/2015 chest radiograph. FINDINGS: Normal heart size. There is thickening of the right paratracheal stripe, new. No pneumothorax. Trace basilar right pleural effusion. No left pleural effusion. There is prominent patchy opacity and irregular pleural thickening at the right lung apex, which is new since 02/26/2015. Clear left lung. IMPRESSION: 1. Prominent patchy opacity and irregular pleural thickening at the right lung apex, new since 02/26/2015. Findings are nonspecific with the differential including pulmonary infection or neoplasm. 2. Thickening of the the right paratracheal stripe is new, cannot exclude right paratracheal mediastinal adenopathy. 3. Recommend further evaluation with chest CT with IV contrast. 4. Trace basilar right pleural effusion. Electronically Signed   By: Ilona Sorrel M.D.   On: 03/23/2016 19:42   Ct Chest W Contrast  04/14/2016  CLINICAL DATA:  History of lung cancer. EXAM: CT CHEST, ABDOMEN, AND PELVIS WITH CONTRAST TECHNIQUE: Multidetector CT imaging of the chest, abdomen and pelvis was performed following the standard protocol during bolus administration of intravenous contrast. CONTRAST:  174m ISOVUE-300 IOPAMIDOL (ISOVUE-300) INJECTION 61% COMPARISON:  Chest radiograph 04/14/2016, chest abdomen pelvis CT 03/23/2016 FINDINGS: CT CHEST FINDINGS Mediastinum/Lymph Nodes: There is a right-sided mediastinal lymphadenopathy with necrotic appearance. Precarinal abnormal appearing lymph node measures 2.9 cm in short axis. There is also similar in appearance right hilar lymphadenopathy. The heart is normal in size. There is no significant pericardial effusion. Mild calcific atherosclerotic disease of the coronary arteries noted. Small nodules are seen along the medial right upper and middle lobe visceral pleura, likely representing seeding with metastatic disease. No evidence of central pulmonary embolus. Lungs/Pleura: There is a nodular soft tissue  thickening of the upper lobe pleura, consistent with neoplastic involvement. Satellite nodules are seen along the medial fissural pleura of the right middle lobe. There is a 7 mm soft tissue nodule in the medial portion of the right upper lobe. More linear pleural thickening is seen within the posterior right lower lobe pleura. There is a small right pleural effusion. The left lung is clear other than mild dependent basilar atelectasis. No evidence of pneumothorax. Musculoskeletal: Metastatic deposit with large soft tissue component is seen associated with the T11 vertebral body causing osteal lysis of the small portion of the right hand side vertebral body, right pedicle and right transverse process. Similar in appearance partially visualized due to collimation metastatic deposit is seen  involving the posterior process of T1 vertebral body CT ABDOMEN PELVIS FINDINGS Hepatobiliary: There are 3 ill-defined liver masses likely representing metastatic disease, which are too vague is to be measured accurately. Periportal edema. There is a 2.7 by 4.2 cm soft tissue mass within porta hepaticus, likely representing enlarged lymph node. Other smaller adjacent lymph nodes are seen. Borderline enlarged but rounded lymph nodes are noted within the gastrohepatic ligament, probably metastatic. Pancreas: No mass, inflammatory changes, or other significant abnormality. Spleen: Within normal limits in size and appearance. Adrenals/Urinary Tract: There is a right adrenal soft tissue mass measuring 3.3 by 2.8 cm, probably metastatic. The left adrenal is normal. There is a left superior water density measuring circumscribed lesion, representing a cyst by CT criteria. Another less defined hypoattenuated lesion is seen in the medial upper cortex of the left kidney. Stomach/Bowel: No evidence of obstruction, inflammatory process, or abnormal fluid collections. Vascular Normal appearance of the abdominal aorta. Reproductive: No mass or  other significant abnormality. Other: None. Musculoskeletal: No suspicious bone lesions identified within the abdomen or pelvis. IMPRESSION: Necrotic appearing right-sided anterior mediastinal and right hilar lymphadenopathy. Extensive nodular soft tissue thickening of the right upper lobe pleura, highly suggestive for metastatic disease with dropped nodules along the medial pleura of the right middle lobe. The previously demonstrated right upper lobe pulmonary based mass has coalesced with the metastatic pleural thickening. Small right upper lobe 7 mm soft tissue nodule, likely neoplastic. Osseous metastatic disease with significant soft tissue component involving the right hand side of T11 vertebral body, and the posterior process of T1 vertebral body. Three large vaguely defined liver masses, which likely represent metastatic disease, difficult to measure due to extremely indistinct borders. Right adrenal mass, porta hepatis and mesenteric lymphadenopathy, highly suggestive of metastatic disease, and increased in size. Indeterminate left renal lesion, which may represent a cyst. Periportal edema within the liver. Electronically Signed   By: Fidela Salisbury M.D.   On: 04/14/2016 18:13   Ct Chest W Contrast  03/23/2016  CLINICAL DATA:  Indeterminate liver masses detected on unenhanced CT abdomen study from earlier today performed in the setting of right flank pain and chills. New pleural thickening and apical right upper lobe opacity on chest radiograph from earlier today. EXAM: CT CHEST, ABDOMEN, AND PELVIS WITH CONTRAST TECHNIQUE: Multidetector CT imaging of the chest, abdomen and pelvis was performed following the standard protocol during bolus administration of intravenous contrast. CONTRAST:  141m ISOVUE-300 IOPAMIDOL (ISOVUE-300) INJECTION 61% COMPARISON:  Unenhanced CT abdomen/pelvis from earlier today. Chest radiograph from earlier today. 02/05/2015 CT chest, abdomen and pelvis. FINDINGS: CT CHEST  Mediastinum/Nodes: Normal heart size. No pericardial fluid/thickening. Left anterior descending coronary atherosclerosis. Atherosclerotic nonaneurysmal thoracic aorta. Normal caliber pulmonary arteries. No central pulmonary emboli. Normal visualized thyroid. Normal esophagus. No axillary adenopathy. Right hilar adenopathy measuring up to 1.5 cm (series 4/ image 71). No left hilar adenopathy. Bulky right paratracheal adenopathy measuring up to 2.8 cm (series 4/image 50). Lungs/Pleura: No pneumothorax. Trace basilar right pleural effusion. There is an irregular 3.4 x 2.2 cm pleural-based lateral apical right upper lobe lung mass (series 8/image 30), probably representing interval growth of the previously described 0.8 x 0.7 cm apical ground-glass right upper lobe pulmonary nodule on the 02/05/2015 chest CT study. This mass is continuous with extensive circumferential lobular pleural thickening and enhancement in the apical right pleural space, which appears to invade the anterior mediastinum at the level of the proximal aortic arch (series 4/ image 59), and is also continuous  with the right paratracheal adenopathy. Patchy ground-glass opacity throughout the parahilar and apical right upper lobe with associated interlobular septal thickening in this location. Apical left upper lobe pulmonary nodules measuring up to 0.6 cm (series 8/ image 16) are not appreciably changed since 02/05/2015. No additional new significant pulmonary nodules. Musculoskeletal:  No aggressive appearing focal osseous lesions. CT ABDOMEN AND PELVIS Hepatobiliary: At least 3 liver masses scattered in the right liver lobe, all new since 02/05/2015, demonstrating peripheral hyperenhancement, measuring 3.1 x 2.9 cm in the segment 6 right liver lobe (series 4/ image 154), 1.4 x 1.4 cm in segment 5 right liver lobe (series 4/ image 157) and 1.7 x 1.6 cm in the segment 8 right liver lobe (series 4/ image 128). Normal gallbladder with no radiopaque  cholelithiasis. No biliary ductal dilatation. Pancreas: Normal, with no mass or duct dilation. Spleen: Normal size. No mass. Adrenals/Urinary Tract: New 1.2 cm right adrenal nodule (series 4/ image 152). No left adrenal nodule. No hydronephrosis. Simple 1.3 cm upper left renal cyst. Subcentimeter hypodense renal cortical lesion in the medial interpolar left kidney, too small to characterize, unchanged since 02/05/2015, suggesting a benign renal cyst. Normal bladder. Stomach/Bowel: Grossly normal stomach. Normal caliber small bowel with no small bowel wall thickening. Normal appendix. Normal large bowel with no diverticulosis, large bowel wall thickening or pericolonic fat stranding. Vascular/Lymphatic: Atherosclerotic nonaneurysmal abdominal aorta. Patent portal, splenic, hepatic and renal veins. New mildly enlarged 1.5 cm portacaval node (series 4/ image 158). No additional pathologically enlarged abdominopelvic lymph nodes. Reproductive: Normal size prostate. Other: No pneumoperitoneum, ascites or focal fluid collection. Musculoskeletal: No aggressive appearing focal osseous lesions. Mild degenerative changes in the lumbar spine. IMPRESSION: 1. Pleural-based lateral apical right upper lobe irregular 3.4 cm lung mass, probably representing interval growth of the 0.8 cm ground-glass pulmonary nodule described on the 02/05/2015 chest CT study, and highly suspicious for a primary bronchogenic carcinoma. Patchy ground-glass opacity and interlobular septal thickening in the right upper lobe of the lung probably represents alveolar hemorrhage and/or lymphangitic tumor. 2. Extensive irregular pleural thickening and enhancement in the apical right pleural space worrisome for malignant pleural spread. Trace basilar right pleural effusion. 3. New right hilar and bulky right paratracheal lymphadenopathy, suspicious for nodal metastases. 4. Three new liver masses worrisome for liver metastases. 5. New right adrenal nodule,  worrisome for right adrenal metastasis. 6. New portacaval upper retroperitoneal adenopathy, suspicious for nodal metastasis. 7. Thoracic surgical and oncology consultation advised for biopsy planning. PET-CT advised for further staging. Electronically Signed   By: Ilona Sorrel M.D.   On: 03/23/2016 22:01   Mr Jeri Cos OE Contrast  03/24/2016  ADDENDUM REPORT: 03/24/2016 13:03 ADDENDUM: Tiny region of blood breakdown products left parietal lobe may reflect result of prior hemorrhagic ischemia. Electronically Signed   By: Genia Del M.D.   On: 03/24/2016 13:03  03/24/2016  CLINICAL DATA:  60 year old male with lung cancer. Staging. Initial encounter. EXAM: MRI HEAD WITHOUT AND WITH CONTRAST TECHNIQUE: Multiplanar, multiecho pulse sequences of the brain and surrounding structures were obtained without and with intravenous contrast. CONTRAST:  30m MULTIHANCE GADOBENATE DIMEGLUMINE 529 MG/ML IV SOLN COMPARISON:  02/05/2015 head CT. FINDINGS: Exam is motion degraded. No discrete parenchymal enhancing lesion however, on diffusion sequence punctate areas of restricted motion medial right parietal lobe, left frontal lobe and the right cerebellum raises possibility of small nonenhancing intracranial metastatic lesions. Heterogeneous bone marrow in a diffuse fashion without focal osseous destructive lesion. No acute infarct or intracranial hemorrhage. Mild  chronic small vessel disease changes. Global atrophy without hydrocephalus. Major intracranial vascular structures are patent. Complete opacification left maxillary sinus with small size suggests and result of chronic obstruction. Diminutive size pituitary gland. No worrisome orbital abnormality noted. IMPRESSION: Exam is motion degraded. No discrete parenchymal enhancing lesion however, on diffusion sequence punctate areas of restricted motion medial right parietal lobe, left frontal lobe and the right cerebellum raises possibility of small nonenhancing intracranial  metastatic lesions. Heterogeneous bone marrow in a diffuse fashion without focal osseous destructive lesion. Mild chronic small vessel disease changes. Global atrophy without hydrocephalus. Complete opacification left maxillary sinus with small size suggests and result of chronic obstruction. Electronically Signed: By: Genia Del M.D. On: 03/24/2016 12:50   Ct Abdomen Pelvis W Contrast  04/14/2016  CLINICAL DATA:  History of lung cancer. EXAM: CT CHEST, ABDOMEN, AND PELVIS WITH CONTRAST TECHNIQUE: Multidetector CT imaging of the chest, abdomen and pelvis was performed following the standard protocol during bolus administration of intravenous contrast. CONTRAST:  157m ISOVUE-300 IOPAMIDOL (ISOVUE-300) INJECTION 61% COMPARISON:  Chest radiograph 04/14/2016, chest abdomen pelvis CT 03/23/2016 FINDINGS: CT CHEST FINDINGS Mediastinum/Lymph Nodes: There is a right-sided mediastinal lymphadenopathy with necrotic appearance. Precarinal abnormal appearing lymph node measures 2.9 cm in short axis. There is also similar in appearance right hilar lymphadenopathy. The heart is normal in size. There is no significant pericardial effusion. Mild calcific atherosclerotic disease of the coronary arteries noted. Small nodules are seen along the medial right upper and middle lobe visceral pleura, likely representing seeding with metastatic disease. No evidence of central pulmonary embolus. Lungs/Pleura: There is a nodular soft tissue thickening of the upper lobe pleura, consistent with neoplastic involvement. Satellite nodules are seen along the medial fissural pleura of the right middle lobe. There is a 7 mm soft tissue nodule in the medial portion of the right upper lobe. More linear pleural thickening is seen within the posterior right lower lobe pleura. There is a small right pleural effusion. The left lung is clear other than mild dependent basilar atelectasis. No evidence of pneumothorax. Musculoskeletal: Metastatic  deposit with large soft tissue component is seen associated with the T11 vertebral body causing osteal lysis of the small portion of the right hand side vertebral body, right pedicle and right transverse process. Similar in appearance partially visualized due to collimation metastatic deposit is seen involving the posterior process of T1 vertebral body CT ABDOMEN PELVIS FINDINGS Hepatobiliary: There are 3 ill-defined liver masses likely representing metastatic disease, which are too vague is to be measured accurately. Periportal edema. There is a 2.7 by 4.2 cm soft tissue mass within porta hepaticus, likely representing enlarged lymph node. Other smaller adjacent lymph nodes are seen. Borderline enlarged but rounded lymph nodes are noted within the gastrohepatic ligament, probably metastatic. Pancreas: No mass, inflammatory changes, or other significant abnormality. Spleen: Within normal limits in size and appearance. Adrenals/Urinary Tract: There is a right adrenal soft tissue mass measuring 3.3 by 2.8 cm, probably metastatic. The left adrenal is normal. There is a left superior water density measuring circumscribed lesion, representing a cyst by CT criteria. Another less defined hypoattenuated lesion is seen in the medial upper cortex of the left kidney. Stomach/Bowel: No evidence of obstruction, inflammatory process, or abnormal fluid collections. Vascular Normal appearance of the abdominal aorta. Reproductive: No mass or other significant abnormality. Other: None. Musculoskeletal: No suspicious bone lesions identified within the abdomen or pelvis. IMPRESSION: Necrotic appearing right-sided anterior mediastinal and right hilar lymphadenopathy. Extensive nodular soft tissue thickening of  the right upper lobe pleura, highly suggestive for metastatic disease with dropped nodules along the medial pleura of the right middle lobe. The previously demonstrated right upper lobe pulmonary based mass has coalesced with the  metastatic pleural thickening. Small right upper lobe 7 mm soft tissue nodule, likely neoplastic. Osseous metastatic disease with significant soft tissue component involving the right hand side of T11 vertebral body, and the posterior process of T1 vertebral body. Three large vaguely defined liver masses, which likely represent metastatic disease, difficult to measure due to extremely indistinct borders. Right adrenal mass, porta hepatis and mesenteric lymphadenopathy, highly suggestive of metastatic disease, and increased in size. Indeterminate left renal lesion, which may represent a cyst. Periportal edema within the liver. Electronically Signed   By: Fidela Salisbury M.D.   On: 04/14/2016 18:13   Ct Abdomen Pelvis W Contrast  03/23/2016  CLINICAL DATA:  Indeterminate liver masses detected on unenhanced CT abdomen study from earlier today performed in the setting of right flank pain and chills. New pleural thickening and apical right upper lobe opacity on chest radiograph from earlier today. EXAM: CT CHEST, ABDOMEN, AND PELVIS WITH CONTRAST TECHNIQUE: Multidetector CT imaging of the chest, abdomen and pelvis was performed following the standard protocol during bolus administration of intravenous contrast. CONTRAST:  128m ISOVUE-300 IOPAMIDOL (ISOVUE-300) INJECTION 61% COMPARISON:  Unenhanced CT abdomen/pelvis from earlier today. Chest radiograph from earlier today. 02/05/2015 CT chest, abdomen and pelvis. FINDINGS: CT CHEST Mediastinum/Nodes: Normal heart size. No pericardial fluid/thickening. Left anterior descending coronary atherosclerosis. Atherosclerotic nonaneurysmal thoracic aorta. Normal caliber pulmonary arteries. No central pulmonary emboli. Normal visualized thyroid. Normal esophagus. No axillary adenopathy. Right hilar adenopathy measuring up to 1.5 cm (series 4/ image 71). No left hilar adenopathy. Bulky right paratracheal adenopathy measuring up to 2.8 cm (series 4/image 50). Lungs/Pleura: No  pneumothorax. Trace basilar right pleural effusion. There is an irregular 3.4 x 2.2 cm pleural-based lateral apical right upper lobe lung mass (series 8/image 30), probably representing interval growth of the previously described 0.8 x 0.7 cm apical ground-glass right upper lobe pulmonary nodule on the 02/05/2015 chest CT study. This mass is continuous with extensive circumferential lobular pleural thickening and enhancement in the apical right pleural space, which appears to invade the anterior mediastinum at the level of the proximal aortic arch (series 4/ image 59), and is also continuous with the right paratracheal adenopathy. Patchy ground-glass opacity throughout the parahilar and apical right upper lobe with associated interlobular septal thickening in this location. Apical left upper lobe pulmonary nodules measuring up to 0.6 cm (series 8/ image 16) are not appreciably changed since 02/05/2015. No additional new significant pulmonary nodules. Musculoskeletal:  No aggressive appearing focal osseous lesions. CT ABDOMEN AND PELVIS Hepatobiliary: At least 3 liver masses scattered in the right liver lobe, all new since 02/05/2015, demonstrating peripheral hyperenhancement, measuring 3.1 x 2.9 cm in the segment 6 right liver lobe (series 4/ image 154), 1.4 x 1.4 cm in segment 5 right liver lobe (series 4/ image 157) and 1.7 x 1.6 cm in the segment 8 right liver lobe (series 4/ image 128). Normal gallbladder with no radiopaque cholelithiasis. No biliary ductal dilatation. Pancreas: Normal, with no mass or duct dilation. Spleen: Normal size. No mass. Adrenals/Urinary Tract: New 1.2 cm right adrenal nodule (series 4/ image 152). No left adrenal nodule. No hydronephrosis. Simple 1.3 cm upper left renal cyst. Subcentimeter hypodense renal cortical lesion in the medial interpolar left kidney, too small to characterize, unchanged since 02/05/2015, suggesting a benign  renal cyst. Normal bladder. Stomach/Bowel: Grossly  normal stomach. Normal caliber small bowel with no small bowel wall thickening. Normal appendix. Normal large bowel with no diverticulosis, large bowel wall thickening or pericolonic fat stranding. Vascular/Lymphatic: Atherosclerotic nonaneurysmal abdominal aorta. Patent portal, splenic, hepatic and renal veins. New mildly enlarged 1.5 cm portacaval node (series 4/ image 158). No additional pathologically enlarged abdominopelvic lymph nodes. Reproductive: Normal size prostate. Other: No pneumoperitoneum, ascites or focal fluid collection. Musculoskeletal: No aggressive appearing focal osseous lesions. Mild degenerative changes in the lumbar spine. IMPRESSION: 1. Pleural-based lateral apical right upper lobe irregular 3.4 cm lung mass, probably representing interval growth of the 0.8 cm ground-glass pulmonary nodule described on the 02/05/2015 chest CT study, and highly suspicious for a primary bronchogenic carcinoma. Patchy ground-glass opacity and interlobular septal thickening in the right upper lobe of the lung probably represents alveolar hemorrhage and/or lymphangitic tumor. 2. Extensive irregular pleural thickening and enhancement in the apical right pleural space worrisome for malignant pleural spread. Trace basilar right pleural effusion. 3. New right hilar and bulky right paratracheal lymphadenopathy, suspicious for nodal metastases. 4. Three new liver masses worrisome for liver metastases. 5. New right adrenal nodule, worrisome for right adrenal metastasis. 6. New portacaval upper retroperitoneal adenopathy, suspicious for nodal metastasis. 7. Thoracic surgical and oncology consultation advised for biopsy planning. PET-CT advised for further staging. Electronically Signed   By: Ilona Sorrel M.D.   On: 03/23/2016 22:01   Ct Biopsy  03/26/2016  INDICATION: 59 year old male with a history of right-sided pleural mass. He has been referred for biopsy. EXAM: CT BIOPSY MEDICATIONS: None. ANESTHESIA/SEDATION:  Moderate (conscious) sedation was employed during this procedure. A total of Versed 2.0 mg and Fentanyl 75 mcg was administered intravenously. Moderate Sedation Time: 12 minutes. The patient's level of consciousness and vital signs were monitored continuously by radiology nursing throughout the procedure under my direct supervision. FLUOROSCOPY TIME:  CT COMPLICATIONS: None PROCEDURE: The procedure, risks, benefits, and alternatives were explained to the patient and the patient's family. Specific risks that were addressed included bleeding, infection, pneumothorax, need for further procedure including chest tube placement, chance of delayed pneumothorax or hemorrhage, hemoptysis, nondiagnostic sample, cardiopulmonary collapse, death. Questions regarding the procedure were encouraged and answered. The patient understands and consents to the procedure. Patient was positioned in the prone position on the CT gantry table and a scout CT of the chest was performed for planning purposes. Once angle of approach was determined, the skin and subcutaneous tissues this scan was prepped and draped in the usual sterile fashion, and a sterile drape was applied covering the operative field. A sterile gown and sterile gloves were used for the procedure. Local anesthesia was provided with 1% Lidocaine. The skin and subcutaneous tissues were infiltrated 1% lidocaine for local anesthesia, and a small stab incision was made with an 11 blade scalpel. Using CT guidance, a 17 gauge trocar needle was advanced into the right pleuraltarget. After confirmation of the tip, multiple separate 18 gauge core biopsies were performed. These were placed into solution for transportation to the lab. Final CT image was performed. Patient tolerated the procedure well and remained hemodynamically stable throughout. No complications were encountered and no significant blood loss was encountered. IMPRESSION: Status post CT-guided biopsy of right pleural  mass. Tissue specimen sent to pathology for complete histopathologic analysis. Signed, Dulcy Fanny. Earleen Newport, DO [facility] Electronically Signed   By: Corrie Mckusick D.O.   On: 03/26/2016 15:57   Ct Renal  Stone Study  03/23/2016  CLINICAL DATA:  Right-sided flank pain at midnight. Chills. History kidney stones. EXAM: CT ABDOMEN AND PELVIS WITHOUT CONTRAST TECHNIQUE: Multidetector CT imaging of the abdomen and pelvis was performed following the standard protocol without IV contrast. COMPARISON:  02/05/2015 FINDINGS: Lower chest: Normal heart size, without pericardial effusion. Trace right pleural fluid with adjacent dependent atelectasis. New. Hepatobiliary: Subtle hypo attenuating right hepatic lobe low-density lesion of 2.7 cm on image 23/ series 2, new. A probable pericholecystic right hepatic lobe lesion of 1.5 cm on image 25/series 2. Normal gallbladder, without biliary ductal dilatation. Pancreas: Normal, without mass or ductal dilatation. Spleen: Normal in size, without focal abnormality. Adrenals/Urinary Tract: Normal adrenal glands. A calcification the upper pole left kidney is likely associated with a otherwise simple appearing cyst on the prior exam. No right-sided renal calculi or hydronephrosis. No hydroureter or ureteric calculi. No bladder calculi. Stomach/Bowel: Portions of the stomach are underdistended. No gross abnormality identified. Scattered colonic diverticula. Normal terminal ileum and appendix. Normal small bowel. Vascular/Lymphatic: Aortic atherosclerosis. No abdominopelvic adenopathy. Reproductive: Normal prostate. Other: No significant free fluid. Musculoskeletal: Scattered pelvic bone islands. Mild disc bulge at the lumbosacral junction. IMPRESSION: 1.  No urinary tract calculi or hydronephrosis. 2. Subtle hypo attenuating right hepatic lobe lesion or lesions, likely new since the prior contrast-enhanced CT. Cannot exclude  neoplastic process including metastatic disease or primary liver lesion/lesions. Recommend further evaluation with pre and post contrast outpatient abdominal MRI . If hepatic abscess is a concern in this patient with right-sided pain and fevers, contrast-enhanced liver protocol CT could be performed more emergently. 3. Trace right pleural fluid and adjacent atelectasis, new since prior CT. These results were called by telephone at the time of interpretation on 03/23/2016 at 6:56 pm to Dr. Larae Grooms , who verbally acknowledged these results. Electronically Signed   By: Abigail Miyamoto M.D.   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  PLAN:    1. Lung cancer: Imaging and pathology results reviewed independently confirming stage IV disease. MRI of the brain reported as negative. Patient will benefit from palliative chemotherapy upon discharge. He also may benefit from palliative XRT for his significant pain. Patient will require a PET scan as well as port placement, but these can be accomplished as an outpatient. No further intervention is needed at this time. Please insure patient has appointment in the [facility] within 1 week of discharge for further evaluation and treatment planning.  2. Pain: Likely secondary to underlying malignancy: Continue current narcotic regimen. Patient will require transition to oral regimen prior to discharge. Follow-up in the [place] as above. 3. Nausea: Improving. Continue antiemetics as prescribed.  Appreciate consult, will follow.   Lloyd Huger, MD   04/16/2016 1:01 PM

## 2016-04-16 NOTE — Progress Notes (Signed)
Initial Nutrition Assessment  DOCUMENTATION CODES:   Severe malnutrition in context of acute illness/injury  INTERVENTION:   Cater to pt preferences on Regular diet order Recommend Ensure Enlive po BID, each supplement provides 350 kcal and 20 grams of protein for added nutrition Recommend collecting new weight   NUTRITION DIAGNOSIS:   Malnutrition related to acute illness as evidenced by energy intake < or equal to 50% for > or equal to 5 days, percent weight loss, mild depletion of muscle mass, moderate depletions of muscle mass.  GOAL:   Patient will meet greater than or equal to 90% of their needs  MONITOR:   PO intake, Supplement acceptance, Labs, Weight trends, I & O's  REASON FOR ASSESSMENT:   Consult Poor PO  ASSESSMENT:   Pt admitted with intractible n/v. Pt with recent diagnosis of non-small cell lung cancer 2 weeks PTA per MD note.  Past Medical History  Diagnosis Date  . Lung cancer ([place])   . COPD (chronic obstructive pulmonary disease) ([place])   . Essential hypertension     Diet Order:  Diet regular Room service appropriate?: Yes; Fluid consistency:: Thin   Pt reports eating 90% of pancakes this am and so far has been tolerating.  Pt reports not being able to tolerate any po for the past week and a day PTA secondary to n/v. Pt reports trying to drink Ensure but just had a few PTA.   Medications: Colace, NS with KCl at 157m/hr Labs: reviewed, Na, Mg, K and Ca low on admission, s/p supplementation now WDL   Gastrointestinal Profile: Last BM:  04/16/2016   Nutrition-Focused Physical Exam Findings: Nutrition-Focused physical exam completed. Findings are no fat depletion, mild-moderate muscle depletion, and no edema.     Weight Change: Pt reports UBW of 150-160lbs up until about a week ago and he 'was surprised' that his weight was 138lbs here yesterday (8% weight loss)   Skin:  Reviewed, no issues   Height:   Ht Readings from Last 1  Encounters:  04/14/16 '5\' 9"'$  (1.753 m)    Weight:   Wt Readings from Last 1 Encounters:  04/14/16 145 lb (65.772 kg)    Wt Readings from Last 10 Encounters:  04/14/16 145 lb (65.772 kg)  03/24/16 147 lb 14.4 oz (67.087 kg)  02/26/15 150 lb (68.04 kg)  03/18/13 144 lb 6.4 oz (65.5 kg)    BMI:  Body mass index is 21.4 kg/(m^2).  Estimated Nutritional Needs:   Kcal:  1980-2310kcals  Protein:  80-100g protein  Fluid:  >/= 2L fluid  EDUCATION NEEDS:   Education needs no appropriate at this time  ADwyane Luo RD, LDN Pager ([phone]/On-Call Pager ([phone]

## 2016-04-16 NOTE — Progress Notes (Signed)
[facility] NAME: Dennis Perkins    MR#:  [id number]  DATE OF BIRTH:  [birth year]  SUBJECTIVE:  CHIEF COMPLAINT:   Chief Complaint  Patient presents with  . Emesis  . Diarrhea  . Nausea   - complains of significant right chest pain, confused this AM after morphine '1mg'$  IV dose - started on fentanyl patch on admission, low grade fever as well  REVIEW OF SYSTEMS:  Review of Systems  Constitutional: Positive for fever and malaise/fatigue. Negative for chills.  HENT: Negative for congestion, ear discharge and nosebleeds.   Eyes: Negative for blurred vision and double vision.  Respiratory: Negative for cough, shortness of breath and wheezing.   Cardiovascular: Positive for chest pain. Negative for palpitations.  Gastrointestinal: Positive for nausea and abdominal pain. Negative for vomiting, diarrhea and constipation.  Genitourinary: Positive for urgency. Negative for dysuria.  Musculoskeletal: Positive for myalgias and joint pain.  Neurological: Negative for dizziness, sensory change, speech change, focal weakness, seizures and headaches.  Psychiatric/Behavioral: Negative for depression.    DRUG ALLERGIES:   Allergies  Allergen Reactions  . Levaquin [Levofloxacin] Other (See Comments)    confusion    VITALS:  Blood pressure 120/84, pulse 80, temperature 97.8 F (36.6 C), temperature source Oral, resp. rate 20, height '5\' 9"'$  (1.753 m), weight 65.772 kg (145 lb), SpO2 97 %.  PHYSICAL EXAMINATION:  Physical Exam  GENERAL:  59 y.o.-year-old patient lying in the bed with no acute distress.  EYES: Pupils equal, round, reactive to light and accommodation. No scleral icterus. Extraocular muscles intact.  HEENT: Head atraumatic, normocephalic. Oropharynx and nasopharynx clear.  NECK:  Supple, no jugular venous distention. No thyroid enlargement, no tenderness.  LUNGS: Normal breath sounds bilaterally, no wheezing,  rales,rhonchi or crepitation. No use of accessory muscles of respiration.  CARDIOVASCULAR: S1, S2 normal. No murmurs, rubs, or gallops.  ABDOMEN: Soft, nontender, nondistended. Bowel sounds present. No organomegaly or mass.  EXTREMITIES: No pedal edema, cyanosis, or clubbing.  NEUROLOGIC: Cranial nerves II through XII are intact. Muscle strength 5/5 in all extremities. Sensation intact. Gait not checked.  PSYCHIATRIC: The patient is alert but confused and anxious today.  SKIN: No obvious rash, lesion, or ulcer.    LABORATORY PANEL:   CBC  Recent Labs Lab 04/16/16 0549  WBC 21.2*  HGB 11.6*  HCT 34.2*  PLT 386   ------------------------------------------------------------------------------------------------------------------  Chemistries   Recent Labs Lab 04/14/16 1448  04/16/16 0549  NA 130*  < > 136  K 2.6*  < > 3.9  CL 88*  < > 103  CO2 29  < > 24  GLUCOSE 117*  < > 93  BUN 11  < > 8  CREATININE 0.69  < > 0.58*  CALCIUM 8.2*  < > 7.6*  MG 1.6*  --  1.7  AST 82*  --   --   ALT 36  --   --   ALKPHOS 91  --   --   BILITOT 1.0  --   --   < > = values in this interval not displayed. ------------------------------------------------------------------------------------------------------------------  Cardiac Enzymes No results for input(s): TROPONINI in the last 168 hours. ------------------------------------------------------------------------------------------------------------------  RADIOLOGY:  Dg Chest 2 View  04/14/2016  CLINICAL DATA:  Patient with right upper chest pain. Recent diagnosis of lung cancer. EXAM: CHEST  2 VIEW COMPARISON:  Chest radiograph 03/26/2016 ; chest CTA 03/23/2016. FINDINGS: Monitoring leads overlie the patient. Stable cardiac and mediastinal contours.  Re- demonstrated pleural thickening and irregular consolidative opacities within the right upper lung. No pleural effusion or pneumothorax. Thoracic spine degenerative changes. IMPRESSION:  Grossly unchanged patchy consolidation within the right upper lung with associated marked pleural thickening. Electronically Signed   By: Lovey Newcomer M.D.   On: 04/14/2016 15:30   Ct Chest W Contrast  04/14/2016  CLINICAL DATA:  History of lung cancer. EXAM: CT CHEST, ABDOMEN, AND PELVIS WITH CONTRAST TECHNIQUE: Multidetector CT imaging of the chest, abdomen and pelvis was performed following the standard protocol during bolus administration of intravenous contrast. CONTRAST:  122m ISOVUE-300 IOPAMIDOL (ISOVUE-300) INJECTION 61% COMPARISON:  Chest radiograph 04/14/2016, chest abdomen pelvis CT 03/23/2016 FINDINGS: CT CHEST FINDINGS Mediastinum/Lymph Nodes: There is a right-sided mediastinal lymphadenopathy with necrotic appearance. Precarinal abnormal appearing lymph node measures 2.9 cm in short axis. There is also similar in appearance right hilar lymphadenopathy. The heart is normal in size. There is no significant pericardial effusion. Mild calcific atherosclerotic disease of the coronary arteries noted. Small nodules are seen along the medial right upper and middle lobe visceral pleura, likely representing seeding with metastatic disease. No evidence of central pulmonary embolus. Lungs/Pleura: There is a nodular soft tissue thickening of the upper lobe pleura, consistent with neoplastic involvement. Satellite nodules are seen along the medial fissural pleura of the right middle lobe. There is a 7 mm soft tissue nodule in the medial portion of the right upper lobe. More linear pleural thickening is seen within the posterior right lower lobe pleura. There is a small right pleural effusion. The left lung is clear other than mild dependent basilar atelectasis. No evidence of pneumothorax. Musculoskeletal: Metastatic deposit with large soft tissue component is seen associated with the T11 vertebral body causing osteal lysis of the small portion of the right hand side vertebral body, right pedicle and right  transverse process. Similar in appearance partially visualized due to collimation metastatic deposit is seen involving the posterior process of T1 vertebral body CT ABDOMEN PELVIS FINDINGS Hepatobiliary: There are 3 ill-defined liver masses likely representing metastatic disease, which are too vague is to be measured accurately. Periportal edema. There is a 2.7 by 4.2 cm soft tissue mass within porta hepaticus, likely representing enlarged lymph node. Other smaller adjacent lymph nodes are seen. Borderline enlarged but rounded lymph nodes are noted within the gastrohepatic ligament, probably metastatic. Pancreas: No mass, inflammatory changes, or other significant abnormality. Spleen: Within normal limits in size and appearance. Adrenals/Urinary Tract: There is a right adrenal soft tissue mass measuring 3.3 by 2.8 cm, probably metastatic. The left adrenal is normal. There is a left superior water density measuring circumscribed lesion, representing a cyst by CT criteria. Another less defined hypoattenuated lesion is seen in the medial upper cortex of the left kidney. Stomach/Bowel: No evidence of obstruction, inflammatory process, or abnormal fluid collections. Vascular Normal appearance of the abdominal aorta. Reproductive: No mass or other significant abnormality. Other: None. Musculoskeletal: No suspicious bone lesions identified within the abdomen or pelvis. IMPRESSION: Necrotic appearing right-sided anterior mediastinal and right hilar lymphadenopathy. Extensive nodular soft tissue thickening of the right upper lobe pleura, highly suggestive for metastatic disease with dropped nodules along the medial pleura of the right middle lobe. The previously demonstrated right upper lobe pulmonary based mass has coalesced with the metastatic pleural thickening. Small right upper lobe 7 mm soft tissue nodule, likely neoplastic. Osseous metastatic disease with significant soft tissue component involving the right hand side  of T11 vertebral body, and the posterior process of  T1 vertebral body. Three large vaguely defined liver masses, which likely represent metastatic disease, difficult to measure due to extremely indistinct borders. Right adrenal mass, porta hepatis and mesenteric lymphadenopathy, highly suggestive of metastatic disease, and increased in size. Indeterminate left renal lesion, which may represent a cyst. Periportal edema within the liver. Electronically Signed   By: Fidela Salisbury M.D.   On: 04/14/2016 18:13   Ct Abdomen Pelvis W Contrast  04/14/2016  CLINICAL DATA:  History of lung cancer. EXAM: CT CHEST, ABDOMEN, AND PELVIS WITH CONTRAST TECHNIQUE: Multidetector CT imaging of the chest, abdomen and pelvis was performed following the standard protocol during bolus administration of intravenous contrast. CONTRAST:  171m ISOVUE-300 IOPAMIDOL (ISOVUE-300) INJECTION 61% COMPARISON:  Chest radiograph 04/14/2016, chest abdomen pelvis CT 03/23/2016 FINDINGS: CT CHEST FINDINGS Mediastinum/Lymph Nodes: There is a right-sided mediastinal lymphadenopathy with necrotic appearance. Precarinal abnormal appearing lymph node measures 2.9 cm in short axis. There is also similar in appearance right hilar lymphadenopathy. The heart is normal in size. There is no significant pericardial effusion. Mild calcific atherosclerotic disease of the coronary arteries noted. Small nodules are seen along the medial right upper and middle lobe visceral pleura, likely representing seeding with metastatic disease. No evidence of central pulmonary embolus. Lungs/Pleura: There is a nodular soft tissue thickening of the upper lobe pleura, consistent with neoplastic involvement. Satellite nodules are seen along the medial fissural pleura of the right middle lobe. There is a 7 mm soft tissue nodule in the medial portion of the right upper lobe. More linear pleural thickening is seen within the posterior right lower lobe pleura. There is a small  right pleural effusion. The left lung is clear other than mild dependent basilar atelectasis. No evidence of pneumothorax. Musculoskeletal: Metastatic deposit with large soft tissue component is seen associated with the T11 vertebral body causing osteal lysis of the small portion of the right hand side vertebral body, right pedicle and right transverse process. Similar in appearance partially visualized due to collimation metastatic deposit is seen involving the posterior process of T1 vertebral body CT ABDOMEN PELVIS FINDINGS Hepatobiliary: There are 3 ill-defined liver masses likely representing metastatic disease, which are too vague is to be measured accurately. Periportal edema. There is a 2.7 by 4.2 cm soft tissue mass within porta hepaticus, likely representing enlarged lymph node. Other smaller adjacent lymph nodes are seen. Borderline enlarged but rounded lymph nodes are noted within the gastrohepatic ligament, probably metastatic. Pancreas: No mass, inflammatory changes, or other significant abnormality. Spleen: Within normal limits in size and appearance. Adrenals/Urinary Tract: There is a right adrenal soft tissue mass measuring 3.3 by 2.8 cm, probably metastatic. The left adrenal is normal. There is a left superior water density measuring circumscribed lesion, representing a cyst by CT criteria. Another less defined hypoattenuated lesion is seen in the medial upper cortex of the left kidney. Stomach/Bowel: No evidence of obstruction, inflammatory process, or abnormal fluid collections. Vascular Normal appearance of the abdominal aorta. Reproductive: No mass or other significant abnormality. Other: None. Musculoskeletal: No suspicious bone lesions identified within the abdomen or pelvis. IMPRESSION: Necrotic appearing right-sided anterior mediastinal and right hilar lymphadenopathy. Extensive nodular soft tissue thickening of the right upper lobe pleura, highly suggestive for metastatic disease with  dropped nodules along the medial pleura of the right middle lobe. The previously demonstrated right upper lobe pulmonary based mass has coalesced with the metastatic pleural thickening. Small right upper lobe 7 mm soft tissue nodule, likely neoplastic. Osseous metastatic disease with significant soft  tissue component involving the right hand side of T11 vertebral body, and the posterior process of T1 vertebral body. Three large vaguely defined liver masses, which likely represent metastatic disease, difficult to measure due to extremely indistinct borders. Right adrenal mass, porta hepatis and mesenteric lymphadenopathy, highly suggestive of metastatic disease, and increased in size. Indeterminate left renal lesion, which may represent a cyst. Periportal edema within the liver. Electronically Signed   By: Fidela Salisbury M.D.   On: 04/14/2016 18:13    EKG:   Orders placed or performed during the hospital encounter of 04/14/16  . EKG 12-Lead  . EKG 12-Lead    ASSESSMENT AND PLAN:   60 year old male with history of COPD, Non-small cell lung cancer with extensive metastatic disease diagnosed at the end of May 2017, essential hypertension presented to the hospital due to intractable nausea and vomiting and also right-sided chest pain from his malignancy.  1. Intractable nausea and vomiting-resolved. Continue IV fluids. -on regular diet now  CT scan of the abdomen pelvis consistent with significant extensive metastatic disease but no evidence of bowel obstruction, or biliary pathology.  2. hypokalemia and hypomagnesemia-replaced appropriately.   3. Right-sided chest pain and right upper quadrant abdominal pain-extensive metastatic disease from his lung cancer which is on the right side.  - couldn't r/o RUL pneumonia and pleuritic chest pain Also bone metastases and liver metastases present. -Increased fentanyl patch. Also continue oral pain medications - discontinue IV morphine as causing  confusion  4. Non-small cell lung cancer-diagnosed at the end of May 2017. Extensive metastatic disease noted as well. MRI of the brain is negative. -Oncology consulted. Supposed to be starting on his first dose of chemotherapy today.  5. Essential hypertension-continue metoprolol.  6. CAP- fevers, leukocytosis, CXR with RUL infiltrate- initially thought to be from the cancer, but persistent leukocytosis and fever now- started rocephin and azithromycin - ID consult requested as well  7. DVT prophylaxis-on Lovenox   All the records are reviewed and case discussed with Care Management/Social Workerr. Management plans discussed with the patient, family and they are in agreement.  CODE STATUS: Full Code  TOTAL TIME TAKING CARE OF THIS PATIENT: 37 minutes.   POSSIBLE D/C IN 2 DAYS, DEPENDING ON CLINICAL CONDITION.   Versia Mignogna M.D on 04/16/2016 at 11:50 AM  Between 7am to 6pm - Pager - [phone]  After 6pm go to [email or web address] - password [email or web address] [facility]  Office  [phone]  CC: Primary care physician; Default, Provider, MD

## 2016-04-16 NOTE — Plan of Care (Signed)
Problem: Respiratory: Goal: Respiratory status will improve Cap on crr pt started on 2 iv abx pt to  Have  ID Consult.

## 2016-04-17 ENCOUNTER — Inpatient Hospital Stay: Payer: Medicaid Other

## 2016-04-17 LAB — BASIC METABOLIC PANEL
ANION GAP: 5 (ref 5–15)
BUN: 8 mg/dL (ref 6–20)
CO2: 26 mmol/L (ref 22–32)
CREATININE: 0.49 mg/dL — AB (ref 0.61–1.24)
Calcium: 7.5 mg/dL — ABNORMAL LOW (ref 8.9–10.3)
Chloride: 104 mmol/L (ref 101–111)
GFR calc non Af Amer: >60 mL/min (ref >60)
Glucose, Bld: 86 mg/dL (ref 65–99)
Potassium: 4 mmol/L (ref 3.5–5.1)
SODIUM: 135 mmol/L (ref 135–145)

## 2016-04-17 LAB — CBC
HCT: 32.3 % — ABNORMAL LOW (ref 40.0–52.0)
HEMOGLOBIN: 10.6 g/dL — AB (ref 13.0–18.0)
MCH: 27.7 pg (ref 26.0–34.0)
MCHC: 32.7 g/dL (ref 32.0–36.0)
MCV: 84.7 fL (ref 80.0–100.0)
Platelets: 351 10*3/uL (ref 150–440)
RBC: 3.81 MIL/uL — ABNORMAL LOW (ref 4.40–5.90)
RDW: 14.8 % — AB (ref 11.5–14.5)
WBC: 19.6 10*3/uL — AB (ref 3.8–10.6)

## 2016-04-17 NOTE — Consult Note (Signed)
[facility] Infectious Disease     Reason for Consult:Sepsis    Referring Physician: Tressia Miners Date of Admission:  04/14/2016   Active Problems:   Intractable nausea and vomiting   Protein-calorie malnutrition, severe   Pneumonia   HPI: Dennis Perkins is a 60 y.o. male with metastatic lung cancer admitted 6/18 with NV and R sided chest pain. On admission had wbc 25 but AF. Developed fever 6/19.  [place] from 6/20 are pending.  CT chest abd pelvix from 6/18 shows necrotic mediastinal and R hilar LAN, RUL nodular thickening and mass, metastatic diseas in Tll and in liver, and abd LAN.  UA negative.   Past Medical History  Diagnosis Date  . Lung cancer ([place])   . COPD (chronic obstructive pulmonary disease) ([place])   . Essential hypertension    Past Surgical History  Procedure Laterality Date  . I&d extremity Right 03/17/2013    Procedure: IRRIGATION AND DEBRIDEMENT EXTREMITY, flexor tendon repair;  Surgeon: Linna Hoff, MD;  Location: [place];  Service: Orthopedics;  Laterality: Right;  . Knee surgery Bilateral    Social History  Substance Use Topics  . Smoking status: Current Some Day Smoker -- 0.50 packs/day for 40 years    Types: Cigarettes  . Smokeless tobacco: Never Used  . Alcohol Use: 16.8 oz/week    28 Cans of beer per week   Family History  Problem Relation Age of Onset  . Breast cancer Mother   . Lung cancer Father     Allergies:  Allergies  Allergen Reactions  . Levaquin [Levofloxacin] Other (See Comments)    confusion    Current antibiotics: Antibiotics Given (last 72 hours)    Date/Time Action Medication Dose Rate   04/16/16 1243 Given   azithromycin (ZITHROMAX) 500 mg in dextrose 5 % 250 mL IVPB 500 mg 250 mL/hr   04/16/16 1249 Given   cefTRIAXone (ROCEPHIN) 1 g in dextrose 5 % 50 mL IVPB 1 g 100 mL/hr   04/17/16 1249 Given   azithromycin (ZITHROMAX) 500 mg in dextrose 5 % 250 mL IVPB 500 mg 250 mL/hr      MEDICATIONS: . azithromycin  500 mg  Intravenous Q24H  . cefTRIAXone (ROCEPHIN)  IV  1 g Intravenous Q24H  . enoxaparin (LOVENOX) injection  40 mg Subcutaneous Q24H  . famotidine  20 mg Oral BID  . feeding supplement (ENSURE ENLIVE)  237 mL Oral BID BM  . fentaNYL  50 mcg Transdermal Q72H  . metoprolol tartrate  25 mg Oral BID  . senna-docusate  2 tablet Oral QHS    Review of Systems - 11 systems reviewed and negative per HPI   OBJECTIVE: Temp:  [98.9 F (37.2 C)-102 F (38.9 C)] 99.3 F (37.4 C) (06/21 0953) Pulse Rate:  [100-116] 116 (06/21 0835) Resp:  [16-20] 18 (06/21 0835) BP: (120-156)/(78-94) 156/78 mmHg (06/21 0835) SpO2:  [91 %-98 %] 92 % (06/21 0835) Weight:  [64.638 kg (142 lb 8 oz)] 64.638 kg (142 lb 8 oz) (06/21 0533) Physical Exam  Constitutional: dishelved, slowed mentation  HENT: anicteric, Mouth/Throat: Oropharynx is clear and dry, poor dentition . No oropharyngeal exudate.  Cardiovascular: Tachy reg  Pulmonary/Chest:poor air movement, decreased bs bil Abdominal: Soft. Bowel sounds are normal. He exhibits no distension. There is no tenderness.  Lymphadenopathy: He has no cervical adenopathy.  Neurological: slowed mentation  Skin: Skin is warm and dry. No rash noted. No erythema.  Psychiatric: flat affect  LABS: Results for orders placed or performed  during the hospital encounter of 04/14/16 (from the past 48 hour(s))  Magnesium     Status: None   Collection Time: 04/16/16  5:49 AM  Result Value Ref Range   Magnesium 1.7 1.7 - 2.4 mg/dL  CBC     Status: Abnormal   Collection Time: 04/16/16  5:49 AM  Result Value Ref Range   WBC 21.2 (H) 3.8 - 10.6 K/uL   RBC 4.10 (L) 4.40 - 5.90 MIL/uL   Hemoglobin 11.6 (L) 13.0 - 18.0 g/dL   HCT 34.2 (L) 40.0 - 52.0 %   MCV 83.6 80.0 - 100.0 fL   MCH 28.3 26.0 - 34.0 pg   MCHC 33.8 32.0 - 36.0 g/dL   RDW 14.7 (H) 11.5 - 14.5 %   Platelets 386 150 - 440 K/uL  Basic metabolic panel     Status: Abnormal   Collection Time: 04/16/16  5:49 AM  Result  Value Ref Range   Sodium 136 135 - 145 mmol/L   Potassium 3.9 3.5 - 5.1 mmol/L   Chloride 103 101 - 111 mmol/L   CO2 24 22 - 32 mmol/L   Glucose, Bld 93 65 - 99 mg/dL   BUN 8 6 - 20 mg/dL   Creatinine, Ser 0.58 (L) 0.61 - 1.24 mg/dL   Calcium 7.6 (L) 8.9 - 10.3 mg/dL   GFR calc non Af Amer >60 >60 mL/min   GFR calc Af Amer >60 >60 mL/min    Comment: (NOTE) The eGFR has been calculated using the CKD EPI equation. This calculation has not been validated in all clinical situations. eGFR's persistently <60 mL/min signify possible Chronic Kidney Disease.    Anion gap 9 5 - 15  CULTURE, BLOOD (ROUTINE X 2) w Reflex to ID Panel     Status: None (Preliminary result)   Collection Time: 04/16/16  5:49 AM  Result Value Ref Range   Specimen Description BLOOD RIGHT HAND    Special Requests BOTTLES DRAWN AEROBIC AND ANAEROBIC 8ML    Culture NO GROWTH < 12 HOURS    Report Status PENDING   CULTURE, BLOOD (ROUTINE X 2) w Reflex to ID Panel     Status: None (Preliminary result)   Collection Time: 04/16/16  5:53 AM  Result Value Ref Range   Specimen Description BLOOD RIGHT HAND    Special Requests      BOTTLES DRAWN AEROBIC AND ANAEROBIC AER 8ML ANA 10ML   Culture NO GROWTH < 12 HOURS    Report Status PENDING   CBC     Status: Abnormal   Collection Time: 04/17/16  5:31 AM  Result Value Ref Range   WBC 19.6 (H) 3.8 - 10.6 K/uL   RBC 3.81 (L) 4.40 - 5.90 MIL/uL   Hemoglobin 10.6 (L) 13.0 - 18.0 g/dL   HCT 32.3 (L) 40.0 - 52.0 %   MCV 84.7 80.0 - 100.0 fL   MCH 27.7 26.0 - 34.0 pg   MCHC 32.7 32.0 - 36.0 g/dL   RDW 14.8 (H) 11.5 - 14.5 %   Platelets 351 150 - 440 K/uL  Basic metabolic panel     Status: Abnormal   Collection Time: 04/17/16  5:31 AM  Result Value Ref Range   Sodium 135 135 - 145 mmol/L   Potassium 4.0 3.5 - 5.1 mmol/L   Chloride 104 101 - 111 mmol/L   CO2 26 22 - 32 mmol/L   Glucose, Bld 86 65 - 99 mg/dL   BUN 8 6 - 20  mg/dL   Creatinine, Ser 0.49 (L) 0.61 - 1.24 mg/dL    Calcium 7.5 (L) 8.9 - 10.3 mg/dL   GFR calc non Af Amer >60 >60 mL/min   GFR calc Af Amer >60 >60 mL/min    Comment: (NOTE) The eGFR has been calculated using the CKD EPI equation. This calculation has not been validated in all clinical situations. eGFR's persistently <60 mL/min signify possible Chronic Kidney Disease.    Anion gap 5 5 - 15   No components found for: ESR, C REACTIVE PROTEIN MICRO: Recent Results (from the past 720 hour(s))  Blood Culture (routine x 2)     Status: None   Collection Time: 03/23/16  6:18 PM  Result Value Ref Range Status   Specimen Description BLOOD RIGHT ANTECUBITAL  Final   Special Requests   Final    BOTTLES DRAWN AEROBIC AND ANAEROBIC  AER 10CC ANA 6CC   Culture NO GROWTH 12 DAYS  Final   Report Status 04/04/2016 FINAL  Final  Blood Culture (routine x 2)     Status: None   Collection Time: 03/23/16  6:18 PM  Result Value Ref Range Status   Specimen Description BLOOD LEFT ANTECUBITAL  Final   Special Requests BOTTLES DRAWN AEROBIC AND ANAEROBIC  8CC  Final   Culture NO GROWTH 12 DAYS  Final   Report Status 04/04/2016 FINAL  Final  Urine culture     Status: Abnormal   Collection Time: 03/23/16  7:15 PM  Result Value Ref Range Status   Specimen Description URINE, RANDOM  Final   Special Requests NONE  Final   Culture (A)  Final    <10,000 COLONIES/mL INSIGNIFICANT GROWTH Performed at [facility]    Report Status 03/25/2016 FINAL  Final  CULTURE, BLOOD (ROUTINE X 2) w Reflex to ID Panel     Status: None (Preliminary result)   Collection Time: 04/16/16  5:49 AM  Result Value Ref Range Status   Specimen Description BLOOD RIGHT HAND  Final   Special Requests BOTTLES DRAWN AEROBIC AND ANAEROBIC 8ML  Final   Culture NO GROWTH < 12 HOURS  Final   Report Status PENDING  Incomplete  CULTURE, BLOOD (ROUTINE X 2) w Reflex to ID Panel     Status: None (Preliminary result)   Collection Time: 04/16/16  5:53 AM  Result Value Ref Range Status    Specimen Description BLOOD RIGHT HAND  Final   Special Requests   Final    BOTTLES DRAWN AEROBIC AND ANAEROBIC AER 8ML ANA 10ML   Culture NO GROWTH < 12 HOURS  Final   Report Status PENDING  Incomplete    IMAGING: Dg Chest 1 View  03/26/2016  CLINICAL DATA:  60 year old male with a history of biopsy pleural lesion EXAM: CHEST 1 VIEW COMPARISON:  Chest CT 03/23/2016, chest x-ray 03/23/2016 FINDINGS: Cardiomediastinal silhouette unchanged in size and contour. Pleural parenchymal thickening on the right, predominantly apical. Fullness in the right hilar region. No pneumothorax or pleural effusion. Left lung relatively well aerated. No displaced fracture IMPRESSION: Status post right-sided pleural mass biopsy with no complicating features. Signed, Dulcy Fanny. Earleen Newport, DO [facility] Electronically Signed   By: Corrie Mckusick D.O.   On: 03/26/2016 15:55   Dg Chest 2 View  04/14/2016  CLINICAL DATA:  Patient with right upper chest pain. Recent diagnosis of lung cancer. EXAM: CHEST  2 VIEW COMPARISON:  Chest radiograph 03/26/2016 ; chest CTA 03/23/2016. FINDINGS: Monitoring leads  overlie the patient. Stable cardiac and mediastinal contours. Re- demonstrated pleural thickening and irregular consolidative opacities within the right upper lung. No pleural effusion or pneumothorax. Thoracic spine degenerative changes. IMPRESSION: Grossly unchanged patchy consolidation within the right upper lung with associated marked pleural thickening. Electronically Signed   By: Lovey Newcomer M.D.   On: 04/14/2016 15:30   Dg Chest 2 View  03/23/2016  CLINICAL DATA:  Right abdominal/back pain. Right pleural fluid on CT abdomen study from earlier today. EXAM: CHEST  2 VIEW COMPARISON:  03/23/2016 unenhanced CT abdomen/ pelvis. 02/26/2015 chest radiograph. FINDINGS: Normal heart size. There is thickening of the right paratracheal stripe, new. No pneumothorax. Trace basilar  right pleural effusion. No left pleural effusion. There is prominent patchy opacity and irregular pleural thickening at the right lung apex, which is new since 02/26/2015. Clear left lung. IMPRESSION: 1. Prominent patchy opacity and irregular pleural thickening at the right lung apex, new since 02/26/2015. Findings are nonspecific with the differential including pulmonary infection or neoplasm. 2. Thickening of the the right paratracheal stripe is new, cannot exclude right paratracheal mediastinal adenopathy. 3. Recommend further evaluation with chest CT with IV contrast. 4. Trace basilar right pleural effusion. Electronically Signed   By: Ilona Sorrel M.D.   On: 03/23/2016 19:42   Ct Chest W Contrast  04/14/2016  CLINICAL DATA:  History of lung cancer. EXAM: CT CHEST, ABDOMEN, AND PELVIS WITH CONTRAST TECHNIQUE: Multidetector CT imaging of the chest, abdomen and pelvis was performed following the standard protocol during bolus administration of intravenous contrast. CONTRAST:  138m ISOVUE-300 IOPAMIDOL (ISOVUE-300) INJECTION 61% COMPARISON:  Chest radiograph 04/14/2016, chest abdomen pelvis CT 03/23/2016 FINDINGS: CT CHEST FINDINGS Mediastinum/Lymph Nodes: There is a right-sided mediastinal lymphadenopathy with necrotic appearance. Precarinal abnormal appearing lymph node measures 2.9 cm in short axis. There is also similar in appearance right hilar lymphadenopathy. The heart is normal in size. There is no significant pericardial effusion. Mild calcific atherosclerotic disease of the coronary arteries noted. Small nodules are seen along the medial right upper and middle lobe visceral pleura, likely representing seeding with metastatic disease. No evidence of central pulmonary embolus. Lungs/Pleura: There is a nodular soft tissue thickening of the upper lobe pleura, consistent with neoplastic involvement. Satellite nodules are seen along the medial fissural pleura of the right middle lobe. There is a 7 mm soft  tissue nodule in the medial portion of the right upper lobe. More linear pleural thickening is seen within the posterior right lower lobe pleura. There is a small right pleural effusion. The left lung is clear other than mild dependent basilar atelectasis. No evidence of pneumothorax. Musculoskeletal: Metastatic deposit with large soft tissue component is seen associated with the T11 vertebral body causing osteal lysis of the small portion of the right hand side vertebral body, right pedicle and right transverse process. Similar in appearance partially visualized due to collimation metastatic deposit is seen involving the posterior process of T1 vertebral body CT ABDOMEN PELVIS FINDINGS Hepatobiliary: There are 3 ill-defined liver masses likely representing metastatic disease, which are too vague is to be measured accurately. Periportal edema. There is a 2.7 by 4.2 cm soft tissue mass within porta hepaticus, likely representing enlarged lymph node. Other smaller adjacent lymph nodes are seen. Borderline enlarged but rounded lymph nodes are noted within the gastrohepatic ligament, probably metastatic. Pancreas: No mass, inflammatory changes, or other significant abnormality. Spleen: Within normal limits in size and appearance. Adrenals/Urinary Tract: There is a right adrenal soft tissue mass measuring 3.3  by 2.8 cm, probably metastatic. The left adrenal is normal. There is a left superior water density measuring circumscribed lesion, representing a cyst by CT criteria. Another less defined hypoattenuated lesion is seen in the medial upper cortex of the left kidney. Stomach/Bowel: No evidence of obstruction, inflammatory process, or abnormal fluid collections. Vascular Normal appearance of the abdominal aorta. Reproductive: No mass or other significant abnormality. Other: None. Musculoskeletal: No suspicious bone lesions identified within the abdomen or pelvis. IMPRESSION: Necrotic appearing right-sided anterior  mediastinal and right hilar lymphadenopathy. Extensive nodular soft tissue thickening of the right upper lobe pleura, highly suggestive for metastatic disease with dropped nodules along the medial pleura of the right middle lobe. The previously demonstrated right upper lobe pulmonary based mass has coalesced with the metastatic pleural thickening. Small right upper lobe 7 mm soft tissue nodule, likely neoplastic. Osseous metastatic disease with significant soft tissue component involving the right hand side of T11 vertebral body, and the posterior process of T1 vertebral body. Three large vaguely defined liver masses, which likely represent metastatic disease, difficult to measure due to extremely indistinct borders. Right adrenal mass, porta hepatis and mesenteric lymphadenopathy, highly suggestive of metastatic disease, and increased in size. Indeterminate left renal lesion, which may represent a cyst. Periportal edema within the liver. Electronically Signed   By: Fidela Salisbury M.D.   On: 04/14/2016 18:13   Ct Chest W Contrast  03/23/2016  CLINICAL DATA:  Indeterminate liver masses detected on unenhanced CT abdomen study from earlier today performed in the setting of right flank pain and chills. New pleural thickening and apical right upper lobe opacity on chest radiograph from earlier today. EXAM: CT CHEST, ABDOMEN, AND PELVIS WITH CONTRAST TECHNIQUE: Multidetector CT imaging of the chest, abdomen and pelvis was performed following the standard protocol during bolus administration of intravenous contrast. CONTRAST:  14m ISOVUE-300 IOPAMIDOL (ISOVUE-300) INJECTION 61% COMPARISON:  Unenhanced CT abdomen/pelvis from earlier today. Chest radiograph from earlier today. 02/05/2015 CT chest, abdomen and pelvis. FINDINGS: CT CHEST Mediastinum/Nodes: Normal heart size. No pericardial fluid/thickening. Left anterior descending coronary atherosclerosis. Atherosclerotic nonaneurysmal thoracic aorta. Normal caliber  pulmonary arteries. No central pulmonary emboli. Normal visualized thyroid. Normal esophagus. No axillary adenopathy. Right hilar adenopathy measuring up to 1.5 cm (series 4/ image 71). No left hilar adenopathy. Bulky right paratracheal adenopathy measuring up to 2.8 cm (series 4/image 50). Lungs/Pleura: No pneumothorax. Trace basilar right pleural effusion. There is an irregular 3.4 x 2.2 cm pleural-based lateral apical right upper lobe lung mass (series 8/image 30), probably representing interval growth of the previously described 0.8 x 0.7 cm apical ground-glass right upper lobe pulmonary nodule on the 02/05/2015 chest CT study. This mass is continuous with extensive circumferential lobular pleural thickening and enhancement in the apical right pleural space, which appears to invade the anterior mediastinum at the level of the proximal aortic arch (series 4/ image 59), and is also continuous with the right paratracheal adenopathy. Patchy ground-glass opacity throughout the parahilar and apical right upper lobe with associated interlobular septal thickening in this location. Apical left upper lobe pulmonary nodules measuring up to 0.6 cm (series 8/ image 16) are not appreciably changed since 02/05/2015. No additional new significant pulmonary nodules. Musculoskeletal:  No aggressive appearing focal osseous lesions. CT ABDOMEN AND PELVIS Hepatobiliary: At least 3 liver masses scattered in the right liver lobe, all new since 02/05/2015, demonstrating peripheral hyperenhancement, measuring 3.1 x 2.9 cm in the segment 6 right liver lobe (series 4/ image 154), 1.4 x 1.4 cm  in segment 5 right liver lobe (series 4/ image 157) and 1.7 x 1.6 cm in the segment 8 right liver lobe (series 4/ image 128). Normal gallbladder with no radiopaque cholelithiasis. No biliary ductal dilatation. Pancreas: Normal, with no mass or duct dilation. Spleen: Normal size. No mass. Adrenals/Urinary Tract: New 1.2 cm right adrenal nodule (series  4/ image 152). No left adrenal nodule. No hydronephrosis. Simple 1.3 cm upper left renal cyst. Subcentimeter hypodense renal cortical lesion in the medial interpolar left kidney, too small to characterize, unchanged since 02/05/2015, suggesting a benign renal cyst. Normal bladder. Stomach/Bowel: Grossly normal stomach. Normal caliber small bowel with no small bowel wall thickening. Normal appendix. Normal large bowel with no diverticulosis, large bowel wall thickening or pericolonic fat stranding. Vascular/Lymphatic: Atherosclerotic nonaneurysmal abdominal aorta. Patent portal, splenic, hepatic and renal veins. New mildly enlarged 1.5 cm portacaval node (series 4/ image 158). No additional pathologically enlarged abdominopelvic lymph nodes. Reproductive: Normal size prostate. Other: No pneumoperitoneum, ascites or focal fluid collection. Musculoskeletal: No aggressive appearing focal osseous lesions. Mild degenerative changes in the lumbar spine. IMPRESSION: 1. Pleural-based lateral apical right upper lobe irregular 3.4 cm lung mass, probably representing interval growth of the 0.8 cm ground-glass pulmonary nodule described on the 02/05/2015 chest CT study, and highly suspicious for a primary bronchogenic carcinoma. Patchy ground-glass opacity and interlobular septal thickening in the right upper lobe of the lung probably represents alveolar hemorrhage and/or lymphangitic tumor. 2. Extensive irregular pleural thickening and enhancement in the apical right pleural space worrisome for malignant pleural spread. Trace basilar right pleural effusion. 3. New right hilar and bulky right paratracheal lymphadenopathy, suspicious for nodal metastases. 4. Three new liver masses worrisome for liver metastases. 5. New right adrenal nodule, worrisome for right adrenal metastasis. 6. New portacaval upper retroperitoneal adenopathy, suspicious for nodal metastasis. 7. Thoracic surgical and oncology consultation advised for biopsy  planning. PET-CT advised for further staging. Electronically Signed   By: Ilona Sorrel M.D.   On: 03/23/2016 22:01   Mr Jeri Cos NI Contrast  03/24/2016  ADDENDUM REPORT: 03/24/2016 13:03 ADDENDUM: Tiny region of blood breakdown products left parietal lobe may reflect result of prior hemorrhagic ischemia. Electronically Signed   By: Genia Del M.D.   On: 03/24/2016 13:03  03/24/2016  CLINICAL DATA:  60 year old male with lung cancer. Staging. Initial encounter. EXAM: MRI HEAD WITHOUT AND WITH CONTRAST TECHNIQUE: Multiplanar, multiecho pulse sequences of the brain and surrounding structures were obtained without and with intravenous contrast. CONTRAST:  9m MULTIHANCE GADOBENATE DIMEGLUMINE 529 MG/ML IV SOLN COMPARISON:  02/05/2015 head CT. FINDINGS: Exam is motion degraded. No discrete parenchymal enhancing lesion however, on diffusion sequence punctate areas of restricted motion medial right parietal lobe, left frontal lobe and the right cerebellum raises possibility of small nonenhancing intracranial metastatic lesions. Heterogeneous bone marrow in a diffuse fashion without focal osseous destructive lesion. No acute infarct or intracranial hemorrhage. Mild chronic small vessel disease changes. Global atrophy without hydrocephalus. Major intracranial vascular structures are patent. Complete opacification left maxillary sinus with small size suggests and result of chronic obstruction. Diminutive size pituitary gland. No worrisome orbital abnormality noted. IMPRESSION: Exam is motion degraded. No discrete parenchymal enhancing lesion however, on diffusion sequence punctate areas of restricted motion medial right parietal lobe, left frontal lobe and the right cerebellum raises possibility of small nonenhancing intracranial metastatic lesions. Heterogeneous bone marrow in a diffuse fashion without focal osseous destructive lesion. Mild chronic small vessel disease changes. Global atrophy without hydrocephalus.  Complete opacification left  maxillary sinus with small size suggests and result of chronic obstruction. Electronically Signed: By: Genia Del M.D. On: 03/24/2016 12:50   Ct Abdomen Pelvis W Contrast  04/14/2016  CLINICAL DATA:  History of lung cancer. EXAM: CT CHEST, ABDOMEN, AND PELVIS WITH CONTRAST TECHNIQUE: Multidetector CT imaging of the chest, abdomen and pelvis was performed following the standard protocol during bolus administration of intravenous contrast. CONTRAST:  143m ISOVUE-300 IOPAMIDOL (ISOVUE-300) INJECTION 61% COMPARISON:  Chest radiograph 04/14/2016, chest abdomen pelvis CT 03/23/2016 FINDINGS: CT CHEST FINDINGS Mediastinum/Lymph Nodes: There is a right-sided mediastinal lymphadenopathy with necrotic appearance. Precarinal abnormal appearing lymph node measures 2.9 cm in short axis. There is also similar in appearance right hilar lymphadenopathy. The heart is normal in size. There is no significant pericardial effusion. Mild calcific atherosclerotic disease of the coronary arteries noted. Small nodules are seen along the medial right upper and middle lobe visceral pleura, likely representing seeding with metastatic disease. No evidence of central pulmonary embolus. Lungs/Pleura: There is a nodular soft tissue thickening of the upper lobe pleura, consistent with neoplastic involvement. Satellite nodules are seen along the medial fissural pleura of the right middle lobe. There is a 7 mm soft tissue nodule in the medial portion of the right upper lobe. More linear pleural thickening is seen within the posterior right lower lobe pleura. There is a small right pleural effusion. The left lung is clear other than mild dependent basilar atelectasis. No evidence of pneumothorax. Musculoskeletal: Metastatic deposit with large soft tissue component is seen associated with the T11 vertebral body causing osteal lysis of the small portion of the right hand side vertebral body, right pedicle and right  transverse process. Similar in appearance partially visualized due to collimation metastatic deposit is seen involving the posterior process of T1 vertebral body CT ABDOMEN PELVIS FINDINGS Hepatobiliary: There are 3 ill-defined liver masses likely representing metastatic disease, which are too vague is to be measured accurately. Periportal edema. There is a 2.7 by 4.2 cm soft tissue mass within porta hepaticus, likely representing enlarged lymph node. Other smaller adjacent lymph nodes are seen. Borderline enlarged but rounded lymph nodes are noted within the gastrohepatic ligament, probably metastatic. Pancreas: No mass, inflammatory changes, or other significant abnormality. Spleen: Within normal limits in size and appearance. Adrenals/Urinary Tract: There is a right adrenal soft tissue mass measuring 3.3 by 2.8 cm, probably metastatic. The left adrenal is normal. There is a left superior water density measuring circumscribed lesion, representing a cyst by CT criteria. Another less defined hypoattenuated lesion is seen in the medial upper cortex of the left kidney. Stomach/Bowel: No evidence of obstruction, inflammatory process, or abnormal fluid collections. Vascular Normal appearance of the abdominal aorta. Reproductive: No mass or other significant abnormality. Other: None. Musculoskeletal: No suspicious bone lesions identified within the abdomen or pelvis. IMPRESSION: Necrotic appearing right-sided anterior mediastinal and right hilar lymphadenopathy. Extensive nodular soft tissue thickening of the right upper lobe pleura, highly suggestive for metastatic disease with dropped nodules along the medial pleura of the right middle lobe. The previously demonstrated right upper lobe pulmonary based mass has coalesced with the metastatic pleural thickening. Small right upper lobe 7 mm soft tissue nodule, likely neoplastic. Osseous metastatic disease with significant soft tissue component involving the right hand side  of T11 vertebral body, and the posterior process of T1 vertebral body. Three large vaguely defined liver masses, which likely represent metastatic disease, difficult to measure due to extremely indistinct borders. Right adrenal mass, porta hepatis and mesenteric lymphadenopathy,  highly suggestive of metastatic disease, and increased in size. Indeterminate left renal lesion, which may represent a cyst. Periportal edema within the liver. Electronically Signed   By: Fidela Salisbury M.D.   On: 04/14/2016 18:13   Ct Abdomen Pelvis W Contrast  03/23/2016  CLINICAL DATA:  Indeterminate liver masses detected on unenhanced CT abdomen study from earlier today performed in the setting of right flank pain and chills. New pleural thickening and apical right upper lobe opacity on chest radiograph from earlier today. EXAM: CT CHEST, ABDOMEN, AND PELVIS WITH CONTRAST TECHNIQUE: Multidetector CT imaging of the chest, abdomen and pelvis was performed following the standard protocol during bolus administration of intravenous contrast. CONTRAST:  159m ISOVUE-300 IOPAMIDOL (ISOVUE-300) INJECTION 61% COMPARISON:  Unenhanced CT abdomen/pelvis from earlier today. Chest radiograph from earlier today. 02/05/2015 CT chest, abdomen and pelvis. FINDINGS: CT CHEST Mediastinum/Nodes: Normal heart size. No pericardial fluid/thickening. Left anterior descending coronary atherosclerosis. Atherosclerotic nonaneurysmal thoracic aorta. Normal caliber pulmonary arteries. No central pulmonary emboli. Normal visualized thyroid. Normal esophagus. No axillary adenopathy. Right hilar adenopathy measuring up to 1.5 cm (series 4/ image 71). No left hilar adenopathy. Bulky right paratracheal adenopathy measuring up to 2.8 cm (series 4/image 50). Lungs/Pleura: No pneumothorax. Trace basilar right pleural effusion. There is an irregular 3.4 x 2.2 cm pleural-based lateral apical right upper lobe lung mass (series 8/image 30), probably representing interval  growth of the previously described 0.8 x 0.7 cm apical ground-glass right upper lobe pulmonary nodule on the 02/05/2015 chest CT study. This mass is continuous with extensive circumferential lobular pleural thickening and enhancement in the apical right pleural space, which appears to invade the anterior mediastinum at the level of the proximal aortic arch (series 4/ image 59), and is also continuous with the right paratracheal adenopathy. Patchy ground-glass opacity throughout the parahilar and apical right upper lobe with associated interlobular septal thickening in this location. Apical left upper lobe pulmonary nodules measuring up to 0.6 cm (series 8/ image 16) are not appreciably changed since 02/05/2015. No additional new significant pulmonary nodules. Musculoskeletal:  No aggressive appearing focal osseous lesions. CT ABDOMEN AND PELVIS Hepatobiliary: At least 3 liver masses scattered in the right liver lobe, all new since 02/05/2015, demonstrating peripheral hyperenhancement, measuring 3.1 x 2.9 cm in the segment 6 right liver lobe (series 4/ image 154), 1.4 x 1.4 cm in segment 5 right liver lobe (series 4/ image 157) and 1.7 x 1.6 cm in the segment 8 right liver lobe (series 4/ image 128). Normal gallbladder with no radiopaque cholelithiasis. No biliary ductal dilatation. Pancreas: Normal, with no mass or duct dilation. Spleen: Normal size. No mass. Adrenals/Urinary Tract: New 1.2 cm right adrenal nodule (series 4/ image 152). No left adrenal nodule. No hydronephrosis. Simple 1.3 cm upper left renal cyst. Subcentimeter hypodense renal cortical lesion in the medial interpolar left kidney, too small to characterize, unchanged since 02/05/2015, suggesting a benign renal cyst. Normal bladder. Stomach/Bowel: Grossly normal stomach. Normal caliber small bowel with no small bowel wall thickening. Normal appendix. Normal large bowel with no diverticulosis, large bowel wall thickening or pericolonic fat stranding.  Vascular/Lymphatic: Atherosclerotic nonaneurysmal abdominal aorta. Patent portal, splenic, hepatic and renal veins. New mildly enlarged 1.5 cm portacaval node (series 4/ image 158). No additional pathologically enlarged abdominopelvic lymph nodes. Reproductive: Normal size prostate. Other: No pneumoperitoneum, ascites or focal fluid collection. Musculoskeletal: No aggressive appearing focal osseous lesions. Mild degenerative changes in the lumbar spine. IMPRESSION: 1. Pleural-based lateral apical right upper lobe irregular 3.4 cm  lung mass, probably representing interval growth of the 0.8 cm ground-glass pulmonary nodule described on the 02/05/2015 chest CT study, and highly suspicious for a primary bronchogenic carcinoma. Patchy ground-glass opacity and interlobular septal thickening in the right upper lobe of the lung probably represents alveolar hemorrhage and/or lymphangitic tumor. 2. Extensive irregular pleural thickening and enhancement in the apical right pleural space worrisome for malignant pleural spread. Trace basilar right pleural effusion. 3. New right hilar and bulky right paratracheal lymphadenopathy, suspicious for nodal metastases. 4. Three new liver masses worrisome for liver metastases. 5. New right adrenal nodule, worrisome for right adrenal metastasis. 6. New portacaval upper retroperitoneal adenopathy, suspicious for nodal metastasis. 7. Thoracic surgical and oncology consultation advised for biopsy planning. PET-CT advised for further staging. Electronically Signed   By: Ilona Sorrel M.D.   On: 03/23/2016 22:01   Ct Biopsy  03/26/2016  INDICATION: 60 year old male with a history of right-sided pleural mass. He has been referred for biopsy. EXAM: CT BIOPSY MEDICATIONS: None. ANESTHESIA/SEDATION: Moderate (conscious) sedation was employed during this procedure. A total of Versed 2.0 mg and Fentanyl 75 mcg was administered intravenously. Moderate Sedation Time: 12 minutes. The patient's level  of consciousness and vital signs were monitored continuously by radiology nursing throughout the procedure under my direct supervision. FLUOROSCOPY TIME:  CT COMPLICATIONS: None PROCEDURE: The procedure, risks, benefits, and alternatives were explained to the patient and the patient's family. Specific risks that were addressed included bleeding, infection, pneumothorax, need for further procedure including chest tube placement, chance of delayed pneumothorax or hemorrhage, hemoptysis, nondiagnostic sample, cardiopulmonary collapse, death. Questions regarding the procedure were encouraged and answered. The patient understands and consents to the procedure. Patient was positioned in the prone position on the CT gantry table and a scout CT of the chest was performed for planning purposes. Once angle of approach was determined, the skin and subcutaneous tissues this scan was prepped and draped in the usual sterile fashion, and a sterile drape was applied covering the operative field. A sterile gown and sterile gloves were used for the procedure. Local anesthesia was provided with 1% Lidocaine. The skin and subcutaneous tissues were infiltrated 1% lidocaine for local anesthesia, and a small stab incision was made with an 11 blade scalpel. Using CT guidance, a 17 gauge trocar needle was advanced into the right pleuraltarget. After confirmation of the tip, multiple separate 18 gauge core biopsies were performed. These were placed into solution for transportation to the lab. Final CT image was performed. Patient tolerated the procedure well and remained hemodynamically stable throughout. No complications were encountered and no significant blood loss was encountered. IMPRESSION: Status post CT-guided biopsy of right pleural mass. Tissue specimen sent to pathology for complete histopathologic analysis. Signed, Dulcy Fanny. Earleen Newport, DO [facility] Radiology Electronically Signed    By: Corrie Mckusick D.O.   On: 03/26/2016 15:57   Ct Renal Stone Study  03/23/2016  CLINICAL DATA:  Right-sided flank pain at midnight. Chills. History kidney stones. EXAM: CT ABDOMEN AND PELVIS WITHOUT CONTRAST TECHNIQUE: Multidetector CT imaging of the abdomen and pelvis was performed following the standard protocol without IV contrast. COMPARISON:  02/05/2015 FINDINGS: Lower chest: Normal heart size, without pericardial effusion. Trace right pleural fluid with adjacent dependent atelectasis. New. Hepatobiliary: Subtle hypo attenuating right hepatic lobe low-density lesion of 2.7 cm on image 23/ series 2, new. A probable pericholecystic right hepatic lobe lesion of 1.5 cm on image 25/series 2. Normal gallbladder, without biliary ductal dilatation. Pancreas:  Normal, without mass or ductal dilatation. Spleen: Normal in size, without focal abnormality. Adrenals/Urinary Tract: Normal adrenal glands. A calcification the upper pole left kidney is likely associated with a otherwise simple appearing cyst on the prior exam. No right-sided renal calculi or hydronephrosis. No hydroureter or ureteric calculi. No bladder calculi. Stomach/Bowel: Portions of the stomach are underdistended. No gross abnormality identified. Scattered colonic diverticula. Normal terminal ileum and appendix. Normal small bowel. Vascular/Lymphatic: Aortic atherosclerosis. No abdominopelvic adenopathy. Reproductive: Normal prostate. Other: No significant free fluid. Musculoskeletal: Scattered pelvic bone islands. Mild disc bulge at the lumbosacral junction. IMPRESSION: 1.  No urinary tract calculi or hydronephrosis. 2. Subtle hypo attenuating right hepatic lobe lesion or lesions, likely new since the prior contrast-enhanced CT. Cannot exclude neoplastic process including metastatic disease or primary liver lesion/lesions. Recommend further evaluation with pre and post contrast outpatient abdominal MRI . If hepatic abscess is a concern in this patient  with right-sided pain and fevers, contrast-enhanced liver protocol CT could be performed more emergently. 3. Trace right pleural fluid and adjacent atelectasis, new since prior CT. These results were called by telephone at the time of interpretation on 03/23/2016 at 6:56 pm to Dr. Larae Grooms , who verbally acknowledged these results. Electronically Signed   By: Abigail Miyamoto M.D.   On: 03/23/2016 18:58    Assessment:   JAHMARI ESBENSHADE is a 60 y.o. male with metastatic lung cancer admitted with chest pain from mets to chest then with fever and elevated WBC. Based on sxs and imaging I suspect he has pulmonary process, possibly post obstructive PNA.  [place] neg.  Does have hx ETOH abuse so could be Withdrawal but apparently has not drunk is several weeks.  Recommendations Would change to unasyn and then can dc on augmentin Would consider palliative radiation if can be done prior to dc  Thank you very much for allowing me to participate in the care of this patient. Please call with questions.   Cheral Marker. Ola Spurr, MD

## 2016-04-17 NOTE — Progress Notes (Signed)
[name] NAME: Dennis Perkins    MR#:  [id number]  DATE OF BIRTH:  [birth year]  SUBJECTIVE:  CHIEF COMPLAINT:   Chief Complaint  Patient presents with  . Emesis  . Diarrhea  . Nausea   - Pain is reasonably better controlled with the fentanyl patch and oral pain medications. The patient appeared very slow to respond and sometimes taking time to get the right words out. -Continues to spike fevers, MAXIMUM TEMPERATURE of 102F today. Started on antibiotics last evening. -ID consult is pending  REVIEW OF SYSTEMS:  Review of Systems  Constitutional: Positive for fever and malaise/fatigue. Negative for chills.  HENT: Negative for congestion, ear discharge and nosebleeds.   Eyes: Negative for blurred vision and double vision.  Respiratory: Negative for cough, shortness of breath and wheezing.   Cardiovascular: Positive for chest pain. Negative for palpitations.  Gastrointestinal: Positive for nausea and abdominal pain. Negative for vomiting, diarrhea and constipation.  Genitourinary: Positive for urgency. Negative for dysuria.  Musculoskeletal: Positive for myalgias and joint pain.  Neurological: Negative for dizziness, sensory change, speech change, focal weakness, seizures and headaches.  Psychiatric/Behavioral: Negative for depression.    DRUG ALLERGIES:   Allergies  Allergen Reactions  . Levaquin [Levofloxacin] Other (See Comments)    confusion    VITALS:  Blood pressure 156/78, pulse 116, temperature 99.3 F (37.4 C), temperature source Oral, resp. rate 18, height '5\' 9"'$  (1.753 m), weight 64.638 kg (142 lb 8 oz), SpO2 92 %.  PHYSICAL EXAMINATION:  Physical Exam  GENERAL:  60 y.o.-year-old patient lying in the bed with no acute distress.  EYES: Pupils equal, round, reactive to light and accommodation. No scleral icterus. Extraocular muscles intact.  HEENT: Head atraumatic, normocephalic. Oropharynx and  nasopharynx clear.  NECK:  Supple, no jugular venous distention. No thyroid enlargement, no tenderness.  LUNGS: Normal breath sounds bilaterally, no wheezing, rales,rhonchi or crepitation. No use of accessory muscles of respiration. Decreased right upper lobe breath sounds. CARDIOVASCULAR: S1, S2 normal. No murmurs, rubs, or gallops.  ABDOMEN: Soft, nontender, nondistended. Bowel sounds present. No organomegaly or mass.  EXTREMITIES: No pedal edema, cyanosis, or clubbing.  NEUROLOGIC: Cranial nerves II through XII are intact. Muscle strength 5/5 in all extremities. Sensation intact. Gait not checked.  PSYCHIATRIC: The patient is alert but confused today.  SKIN: No obvious rash, lesion, or ulcer.    LABORATORY PANEL:   CBC  Recent Labs Lab 04/17/16 0531  WBC 19.6*  HGB 10.6*  HCT 32.3*  PLT 351   ------------------------------------------------------------------------------------------------------------------  Chemistries   Recent Labs Lab 04/14/16 1448  04/16/16 0549 04/17/16 0531  NA 130*  < > 136 135  K 2.6*  < > 3.9 4.0  CL 88*  < > 103 104  CO2 29  < > 24 26  GLUCOSE 117*  < > 93 86  BUN 11  < > 8 8  CREATININE 0.69  < > 0.58* 0.49*  CALCIUM 8.2*  < > 7.6* 7.5*  MG 1.6*  --  1.7  --   AST 82*  --   --   --   ALT 36  --   --   --   ALKPHOS 91  --   --   --   BILITOT 1.0  --   --   --   < > = values in this interval not displayed. ------------------------------------------------------------------------------------------------------------------  Cardiac Enzymes No results for input(s): TROPONINI in the last  168 hours. ------------------------------------------------------------------------------------------------------------------  RADIOLOGY:  No results found.  EKG:   Orders placed or performed during the hospital encounter of 04/14/16  . EKG 12-Lead  . EKG 12-Lead    ASSESSMENT AND PLAN:   60 year old male with history of COPD, Non-small cell lung  cancer with extensive metastatic disease diagnosed at the end of May 2017, essential hypertension presented to the hospital due to intractable nausea and vomiting and also right-sided chest pain from his malignancy.  1.Acute confusion-slow to respond. MRI of the brain as outpatient was negative as part of cancer workup. -We'll get a CT head. Most likely could be from pain medications. Continue to monitor at this time  2. Fevers-high-grade fevers, blood cultures pending. CT chest and abdomen on admission showing right upper lobe malignant disease and diffuse metastasis, -Started on Rocephin and azithromycin for possible pneumonia. -ID consult is pending at this time  3. Right-sided chest pain and right upper quadrant abdominal pain-extensive metastatic disease from his lung cancer which is on the right side.  Also bone metastases and liver metastases present. -Increased fentanyl patch. Also continue oral pain medications-pain is moderate at this time and better controlled. -Agree with radiation therapy to help control the pain as outpatient - discontinue IV morphine as causing confusion  4. Non-small cell lung cancer-diagnosed at the end of May 2017. Extensive metastatic disease noted as well. MRI of the brain is negative. -Oncology consulted. Chemotherapy as outpatient once more stable  5. Essential hypertension-continue metoprolol.  6. DVT prophylaxis-on Lovenox   All the records are reviewed and case discussed with Care Management/Social Workerr. Management plans discussed with the patient, family and they are in agreement.  CODE STATUS: Full Code  TOTAL TIME TAKING CARE OF THIS PATIENT: 35 minutes.   POSSIBLE D/C IN 2 DAYS, DEPENDING ON CLINICAL CONDITION.   Gladstone Lighter M.D on 04/17/2016 at 12:43 PM  Between 7am to 6pm - Pager - [phone]  After 6pm go to [email or web address] - password [email or web address] [facility]  Office  [phone]  CC: Primary care  physician; Default, Provider, MD

## 2016-04-18 ENCOUNTER — Inpatient Hospital Stay: Payer: Medicaid Other

## 2016-04-18 ENCOUNTER — Inpatient Hospital Stay: Payer: Self-pay | Admitting: Oncology

## 2016-04-18 LAB — CBC
HCT: 31.2 % — ABNORMAL LOW (ref 40.0–52.0)
HEMOGLOBIN: 10.5 g/dL — AB (ref 13.0–18.0)
MCH: 28.3 pg (ref 26.0–34.0)
MCHC: 33.7 g/dL (ref 32.0–36.0)
MCV: 83.9 fL (ref 80.0–100.0)
Platelets: 334 10*3/uL (ref 150–440)
RBC: 3.71 MIL/uL — ABNORMAL LOW (ref 4.40–5.90)
RDW: 14.8 % — AB (ref 11.5–14.5)
WBC: 18.1 10*3/uL — ABNORMAL HIGH (ref 3.8–10.6)

## 2016-04-18 MED ORDER — SODIUM CHLORIDE 0.9 % IV SOLN
INTRAVENOUS | Status: DC
  Administered 2016-04-18 – 2016-04-19 (×3): via INTRAVENOUS

## 2016-04-18 MED ORDER — SODIUM CHLORIDE 0.9 % IV SOLN
3.0000 g | Freq: Four times a day (QID) | INTRAVENOUS | Status: DC
  Administered 2016-04-18 – 2016-04-22 (×17): 3 g via INTRAVENOUS
  Filled 2016-04-18 (×19): qty 3

## 2016-04-18 MED ORDER — FENTANYL 12 MCG/HR TD PT72
12.5000 ug | MEDICATED_PATCH | TRANSDERMAL | Status: DC
  Administered 2016-04-18 – 2016-04-21 (×2): 12.5 ug via TRANSDERMAL
  Filled 2016-04-18 (×2): qty 1

## 2016-04-18 NOTE — Consult Note (Signed)
Pharmacy Antibiotic Note  Dennis Perkins is a 60 y.o. male admitted on 04/14/2016 with post obstructive PNA.  Pharmacy has been consulted for unasyn dosing.  Plan: unasyn 3g q 6 hours  Height: '5\' 9"'$  (175.3 cm) Weight: 144 lb 14.4 oz (65.726 kg) IBW/kg (Calculated) : 70.7  Temp (24hrs), Avg:99.2 F (37.3 C), Min:98.6 F (37 C), Max:99.7 F (37.6 C)   Recent Labs Lab 04/14/16 1448 04/15/16 0457 04/16/16 0549 04/17/16 0531 04/18/16 0508  WBC 25.9* 22.8* 21.2* 19.6* 18.1*  CREATININE 0.69 0.59* 0.58* 0.49*  --     Estimated Creatinine Clearance: 92.4 mL/min (by C-G formula based on Cr of 0.49).    Allergies  Allergen Reactions  . Levaquin [Levofloxacin] Other (See Comments)    confusion    Antimicrobials this admission: Ceftriaxone 6/20>> 6/22 Azithromycin 6/20>>6/22 unasyn 6/22   Dose adjustments this admission:   Microbiology results: 6/20 BCx: ng 2 days   Thank you for allowing pharmacy to be a part of this patient's care.  Ramond Dial, Pharm.D Clinical Pharmacist  04/18/2016 9:51 AM

## 2016-04-18 NOTE — Progress Notes (Signed)
[facility] NAME: Dennis Perkins    MR#:  [id number]  DATE OF BIRTH:  [birth year]  SUBJECTIVE:  CHIEF COMPLAINT:   Chief Complaint  Patient presents with  . Emesis  . Diarrhea  . Nausea   - continues to spike fevers - ABX changed, very sleepy and confused today - CT head is negative  REVIEW OF SYSTEMS:  Review of Systems  Unable to perform ROS: mental acuity    DRUG ALLERGIES:   Allergies  Allergen Reactions  . Levaquin [Levofloxacin] Other (See Comments)    confusion    VITALS:  Blood pressure 120/74, pulse 102, temperature 98.8 F (37.1 C), temperature source Oral, resp. rate 17, height '5\' 9"'$  (1.753 m), weight 65.726 kg (144 lb 14.4 oz), SpO2 94 %.  PHYSICAL EXAMINATION:  Physical Exam  GENERAL:  60 y.o.-year-old patient lying in the bed with no acute distress.  EYES: Pupils equal, round, reactive to light and accommodation. No scleral icterus. Extraocular muscles intact.  HEENT: Head atraumatic, normocephalic. Oropharynx and nasopharynx clear.  NECK:  Supple, no jugular venous distention. No thyroid enlargement, no tenderness.  LUNGS: Normal breath sounds bilaterally, no wheezing, rales,rhonchi or crepitation. No use of accessory muscles of respiration. Decreased right upper lobe breath sounds. CARDIOVASCULAR: S1, S2 normal. No murmurs, rubs, or gallops.  ABDOMEN: Soft, nontender, nondistended. Bowel sounds present. No organomegaly or mass.  EXTREMITIES: No pedal edema, cyanosis, or clubbing.  NEUROLOGIC: very sleepy, following some simple commands, no focal deficits  PSYCHIATRIC: The patient is lethargic today.  SKIN: No obvious rash, lesion, or ulcer.    LABORATORY PANEL:   CBC  Recent Labs Lab 04/18/16 0508  WBC 18.1*  HGB 10.5*  HCT 31.2*  PLT 334   ------------------------------------------------------------------------------------------------------------------  Chemistries   Recent  Labs Lab 04/14/16 1448  04/16/16 0549 04/17/16 0531  NA 130*  < > 136 135  K 2.6*  < > 3.9 4.0  CL 88*  < > 103 104  CO2 29  < > 24 26  GLUCOSE 117*  < > 93 86  BUN 11  < > 8 8  CREATININE 0.69  < > 0.58* 0.49*  CALCIUM 8.2*  < > 7.6* 7.5*  MG 1.6*  --  1.7  --   AST 82*  --   --   --   ALT 36  --   --   --   ALKPHOS 91  --   --   --   BILITOT 1.0  --   --   --   < > = values in this interval not displayed. ------------------------------------------------------------------------------------------------------------------  Cardiac Enzymes No results for input(s): TROPONINI in the last 168 hours. ------------------------------------------------------------------------------------------------------------------  RADIOLOGY:  Ct Head Wo Contrast  04/17/2016  CLINICAL DATA:  Confusion, history of lung cancer EXAM: CT HEAD WITHOUT CONTRAST TECHNIQUE: Contiguous axial images were obtained from the base of the skull through the vertex without intravenous contrast. COMPARISON:  Brain MRI 03/24/2016 FINDINGS: No skull fracture is noted. Paranasal sinuses shows mild mucosal thickening left maxillary sinus. No intracranial hemorrhage, mass effect or midline shift. No acute cortical infarction. No mass lesion is noted on this unenhanced scan. No intraventricular hemorrhage. No vascular calcifications are noted. IMPRESSION: No acute intracranial abnormality. There is mucosal thickening left maxillary sinus. No definite acute cortical infarction. Electronically Signed   By: Lahoma Crocker M.D.   On: 04/17/2016 16:43    EKG:   Orders placed or  performed during the hospital encounter of 04/14/16  . EKG 12-Lead  . EKG 12-Lead    ASSESSMENT AND PLAN:   60 year old male with history of COPD, Non-small cell lung cancer with extensive metastatic disease diagnosed at the end of May 2017, essential hypertension presented to the hospital due to intractable nausea and vomiting and also right-sided chest pain  from his malignancy.  1.Acute confusion/ AMS- likely toxic metabolic encephalopathy - decrease fentanyl patch dose - CT of head without any acute findings - recent MRI of the brain as outpatient was negative for any metastases. - monitor closely  2. Post obstructive pneumonia- with high-grade fevers, blood cultures pending. -CT chest and abdomen on admission showing right upper lobe malignant disease and diffuse metastasis, -ABX changed to Unasyn, appreciate ID consult - Fevers could be from his underlying malignant disease as well. However WBC count is also significantly high- monitor  3. Right-sided chest pain and right upper quadrant abdominal pain-extensive metastatic disease from his lung cancer which is on the right side.  Also bone metastases and liver metastases present. -decreased fentanyl patch due to AMS. - radiation oncology consult to see if palliative radiation would help with the pain.   4. Non-small cell lung cancer-diagnosed at the end of May 2017. Extensive metastatic disease noted as well. MRI of the brain is negative. -Oncology consulted. Chemotherapy once more stable  5. Essential hypertension-continue metoprolol.  6. DVT prophylaxis-on Lovenox   All the records are reviewed and case discussed with Care Management/Social Workerr. Management plans discussed with the patient, family and they are in agreement.  CODE STATUS: Full Code  TOTAL TIME TAKING CARE OF THIS PATIENT: 35 minutes.   POSSIBLE D/C IN 2 DAYS, DEPENDING ON CLINICAL CONDITION.   Gladstone Lighter M.D on 04/18/2016 at 2:15 PM  Between 7am to 6pm - Pager - [phone]  After 6pm go to [email or web address] - password [email or web address] [facility]  Office  [phone]  CC: Primary care physician; Default, Provider, MD

## 2016-04-18 NOTE — Progress Notes (Signed)
Removed old Fentanyl patch and applied new one per order

## 2016-04-19 ENCOUNTER — Ambulatory Visit: Payer: Medicaid Other | Admitting: Radiation Oncology

## 2016-04-19 DIAGNOSIS — Z66 Do not resuscitate: Secondary | ICD-10-CM

## 2016-04-19 DIAGNOSIS — Z515 Encounter for palliative care: Secondary | ICD-10-CM

## 2016-04-19 DIAGNOSIS — C3490 Malignant neoplasm of unspecified part of unspecified bronchus or lung: Secondary | ICD-10-CM | POA: Insufficient documentation

## 2016-04-19 DIAGNOSIS — C7970 Secondary malignant neoplasm of unspecified adrenal gland: Secondary | ICD-10-CM

## 2016-04-19 DIAGNOSIS — Z510 Encounter for antineoplastic radiation therapy: Secondary | ICD-10-CM | POA: Insufficient documentation

## 2016-04-19 LAB — BASIC METABOLIC PANEL
ANION GAP: 7 (ref 5–15)
BUN: 9 mg/dL (ref 6–20)
CALCIUM: 7.9 mg/dL — AB (ref 8.9–10.3)
CO2: 29 mmol/L (ref 22–32)
Chloride: 102 mmol/L (ref 101–111)
Creatinine, Ser: 0.61 mg/dL (ref 0.61–1.24)
GFR calc non Af Amer: >60 mL/min (ref >60)
Glucose, Bld: 100 mg/dL — ABNORMAL HIGH (ref 65–99)
Potassium: 3.2 mmol/L — ABNORMAL LOW (ref 3.5–5.1)
SODIUM: 138 mmol/L (ref 135–145)

## 2016-04-19 LAB — AMMONIA

## 2016-04-19 LAB — MAGNESIUM: MAGNESIUM: 1.6 mg/dL — AB (ref 1.7–2.4)

## 2016-04-19 LAB — CBC
HCT: 34 % — ABNORMAL LOW (ref 40.0–52.0)
HEMOGLOBIN: 11.4 g/dL — AB (ref 13.0–18.0)
MCH: 28.1 pg (ref 26.0–34.0)
MCHC: 33.5 g/dL (ref 32.0–36.0)
MCV: 83.8 fL (ref 80.0–100.0)
PLATELETS: 349 10*3/uL (ref 150–440)
RBC: 4.06 MIL/uL — AB (ref 4.40–5.90)
RDW: 15.1 % — ABNORMAL HIGH (ref 11.5–14.5)
WBC: 18.7 10*3/uL — AB (ref 3.8–10.6)

## 2016-04-19 LAB — LACTIC ACID, PLASMA
LACTIC ACID, VENOUS: 2.1 mmol/L — AB (ref 0.5–2.0)
LACTIC ACID, VENOUS: 2.9 mmol/L — AB (ref 0.5–2.0)

## 2016-04-19 MED ORDER — IBUPROFEN 400 MG PO TABS
400.0000 mg | ORAL_TABLET | Freq: Once | ORAL | Status: AC
  Administered 2016-04-19: 01:00:00 400 mg via ORAL
  Filled 2016-04-19: qty 1

## 2016-04-19 MED ORDER — POTASSIUM CHLORIDE CRYS ER 20 MEQ PO TBCR
40.0000 meq | EXTENDED_RELEASE_TABLET | Freq: Once | ORAL | Status: AC
  Administered 2016-04-19: 11:00:00 40 meq via ORAL
  Filled 2016-04-19: qty 2

## 2016-04-19 NOTE — Progress Notes (Signed)
Patient has his Simulation appointment at the cancer center on Mon Jul 26th at 2:00 pm

## 2016-04-19 NOTE — Progress Notes (Signed)
Paged and spoke with Dr. Estanislado Pandy about pt rectal temp of 95.3. Per Dr. Estanislado Pandy, recheck tmep in an hour and apply warm blanket if temp does not improve. Will keep room temp at warm to try get pt temp back up. No new orders at this time.

## 2016-04-19 NOTE — Care Management (Addendum)
Admitted to [facility] with the diagnosis of intractable nausea vomiting. Discharged from this facility 03/27/16. Lives alone. Sister is Amalia Hailey [phone]). Application for Medication Management given last admission. Follow-up appointment arranged with the [facility] arranged last admission. Poor prognosis. Temperature = 102.2-95.5. WBC's 18.7. IV Unasyn. Pain Control issues. Palliative Care consult in progress. Now a DNR Shelbie Ammons RN MSN CCM Care Management [phone]

## 2016-04-19 NOTE — Progress Notes (Signed)
Let Dr Osvaldo Angst know that lactic acid was 2.1

## 2016-04-19 NOTE — Progress Notes (Signed)
Apply bear hugger at [phone]

## 2016-04-19 NOTE — Consult Note (Signed)
Palliative Medicine Inpatient Consult Note   Name: Dennis Perkins Date: 04/19/2016 MRN: [id number]  DOB: Jul 13, 1956  Referring Physician: Gladstone Lighter, MD  Palliative Care consult requested for this 60 y.o. male for goals of medical therapy in patient with stage 4 extensively metastatic lung cancer (to liver and bones).  DISCUSSIONS AND PLANS: I was able to converse with pt and he was able to give me the answers to some of my questions.  I was quite focused on trying to find out a little more about him, his family support options, etc.  He got a call from the cafeteria while I was in the room and he told them to cancel his dinner because he was 'going to be discharged today'.  I doubt that is accurate given his condition.  He has his pain controlled currently.  He had his Fentanyl reduced and he is more alert today. Still a bit confused, however.    He lives alone per report --but he tells me his sister lives with him.  Earlier he had been making statements about 'not having anyone'. While I am in the room, he tells me his sister 'will be there for him and she will 'do whatever he needs'.  He says she works part time, however.  Pt has two daughters and I am not convinced they know about his cancer or his hospitalization. He says his youngest lives locally and the other one is 'not in touch' and she is 'too busy' because 'she is married'. The other daughter lives in [place].    Pt has no insurance. He did not make it to the follow up cancer appt with Dr. Grayland Ormond. He is going to need help with meds as he says he does not have a lot of money. But, he adds that he has been blessed before and he thinks the church will help him. Then he tells me not to notify anyone at the church [facility]).  His social situation is as troubling as his current medical condition.   He has been offered palliative radiation.  There are big barriers to this happening.  He would have to recover from this febrile  illness (either due to sepsis or due to tumor necrosis) and have the support he needs to get to his outpt appt for 'simulation' for XRT and also the support he needs to navigate the arrangements that would have to be set up given lack of insurance.  He did not make it to his outpt oncology appt.  Others in his family will need to be involved in the plan of care for it to work. And I am not sure about what they know about his condition and how very severe/ end-stage it is.     I was able to bring up code status and he knew what I was talking about and he said he would not want to have resuscitation attempted and would not want to live on life support. He also said he would not want dialysis or a feeding tube. He does want radiation / treatment for his cancer but nodded that he understands it is not to try to cure the cancer but rather to try to relieve some of his severe pain.    I have talked with care manager about the fact that this pt is at high risk for needing more support when discharged than he currently has in place.  In fact, he looks to me to be someone who would  be very appropriate for Hospice Home now.    But, he thinks he is being discharged home and that he will 'be fine at home' and that his sister 'will take care of everything he needs'.   I imagine that his clinical course over the next few days will determine some of the decisions about where he is discharged to and whether or not by that time, he is open to care by hospice or even Hospice Home.  I will sign off at this time. Consider re-consulting Palliative Care when/ if the situation evolves with more clarity over the next few days.     CLINICAL NARRATIVE: Dennis Perkins is a 60 yo man who was found during a hospital stay here May 27 - May 31, to have non-small cell carcinoma with masses of lung, liver, and adrenal gland. A biopsy was done by IR on 01/25/16.  Pt was to follow up with Dr. Grayland Ormond to follow up re: clinical options.  He  had been RXd MS Contin 15 mg bid and also prn oxycodeon.  He had a fever last admission felt to be tumor related. He had empiric ABX as inpt and also prescribed.  He was tachycardic and metoprolol was Rxd.    He did not make it to his oncology appt.  He has not had a PET-Ct scan.  He came back here this time with intractable nausea and vomiting, now controlled. His pain required an increase in pain meds but he became quite confused and the Fentanyl was reduced and he is more alert.  He has significant pain in the right side of his chest with pain often 10 out of 10.  He has had some night sweats. He had ibuprofen for fever and then his temperature dropped and he required a [facility].  He has lost about 40 lbs in 3-6 months time.  He is severely hypokalemic and malnourished.     REVIEW OF SYSTEMS:  Patient is not able to provide ROS in detail due to some confusion. He feels his pain is controlled at the moment.   SPIRITUAL SUPPORT SYSTEM: unclear  SOCIAL HISTORY:  reports that he has been smoking Cigarettes.  He has a 20 pack-year smoking history. He has never used smokeless tobacco. He reports that he drinks about 16.8 oz of alcohol per week. He reports that he does not use illicit drugs. He lives with his sister. She works part time jobs now and then.  He hasn't been working lately b/c he has been too sick.  He is divorced and not in contact with his exwife.  He says he has two adult daughters. One lives locally and she knows he has cancer.  He thinks that daughter has told the other daughter (who is married and lives in [place], [place]).  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 He says someone there knows his situation and he will 'get help'.  LEGAL DOCUMENTS:  I have completed a portable DNR form for the record.    CODE STATUS: DNR DNI as of right now. Also says he would not want dialysis or a feeding  tube.  PAST MEDICAL HISTORY: Past Medical History  Diagnosis Date  . Lung cancer ([place])   . COPD (chronic obstructive pulmonary disease) ([place])   . Essential hypertension     PAST SURGICAL HISTORY:  Past Surgical  History  Procedure Laterality Date  . I&d extremity Right 03/17/2013    Procedure: IRRIGATION AND DEBRIDEMENT EXTREMITY, flexor tendon repair;  Surgeon: Linna Hoff, MD;  Location: [place];  Service: Orthopedics;  Laterality: Right;  . Knee surgery Bilateral     ALLERGIES:  is allergic to levaquin.  MEDICATIONS:  Current Facility-Administered Medications  Medication Dose Route Frequency Provider Last Rate Last Dose  . 0.9 %  sodium chloride infusion   Intravenous Continuous Gladstone Lighter, MD 75 mL/hr at 04/19/16 0740    . acetaminophen (TYLENOL) tablet 650 mg  650 mg Oral Q6H PRN Henreitta Leber, MD   650 mg at 04/18/16 2207   Or  . acetaminophen (TYLENOL) suppository 650 mg  650 mg Rectal Q6H PRN Henreitta Leber, MD      . Ampicillin-Sulbactam (UNASYN) 3 g in sodium chloride 0.9 % 100 mL IVPB  3 g Intravenous Q6H Melissa D Maccia, RPH   3 g at 04/19/16 1037  . enoxaparin (LOVENOX) injection 40 mg  40 mg Subcutaneous Q24H Henreitta Leber, MD   40 mg at 04/18/16 2207  . famotidine (PEPCID) tablet 20 mg  20 mg Oral BID Henreitta Leber, MD   20 mg at 04/19/16 1037  . feeding supplement (ENSURE ENLIVE) (ENSURE ENLIVE) liquid 237 mL  237 mL Oral BID BM Gladstone Lighter, MD   237 mL at 04/19/16 1000  . fentaNYL (DURAGESIC - dosed mcg/hr) 12.5 mcg  12.5 mcg Transdermal Q72H Gladstone Lighter, MD   12.5 mcg at 04/18/16 1242  . HYDROcodone-acetaminophen (NORCO) 10-325 MG per tablet 1 tablet  1 tablet Oral Q4H PRN Gladstone Lighter, MD   1 tablet at 04/19/16 0136  . magnesium hydroxide (MILK OF MAGNESIA) suspension 30 mL  30 mL Oral Daily PRN Gladstone Lighter, MD   30 mL at 04/16/16 0931  . metoprolol tartrate (LOPRESSOR) tablet 25 mg  25 mg Oral BID Henreitta Leber, MD   25 mg  at 04/19/16 1036  . nitroGLYCERIN (NITROSTAT) SL tablet 0.4 mg  0.4 mg Sublingual Q5 min PRN Max Sane, MD   0.4 mg at 04/14/16 2348  . ondansetron (ZOFRAN) tablet 4 mg  4 mg Oral Q6H PRN Henreitta Leber, MD       Or  . ondansetron [facility]) injection 4 mg  4 mg Intravenous Q6H PRN Henreitta Leber, MD   4 mg at 04/14/16 2141  . polyethylene glycol (MIRALAX / GLYCOLAX) packet 17 g  17 g Oral Daily PRN Henreitta Leber, MD   17 g at 04/16/16 0931  . senna-docusate (Senokot-S) tablet 2 tablet  2 tablet Oral QHS Gladstone Lighter, MD   2 tablet at 04/18/16 2207    Vital Signs: BP 116/75 mmHg  Pulse 93  Temp(Src) 98.4 F (36.9 C) (Oral)  Resp 16  Ht '5\' 9"'$  (1.753 m)  Wt 65.817 kg (145 lb 1.6 oz)  BMI 21.42 kg/m2  SpO2 95% Filed Weights   04/17/16 0533 04/18/16 0500 04/19/16 0500  Weight: 64.638 kg (142 lb 8 oz) 65.726 kg (144 lb 14.4 oz) 65.817 kg (145 lb 1.6 oz)    Estimated body mass index is 21.42 kg/(m^2) as calculated from the following:   Height as of this encounter: '5\' 9"'$  (1.753 m).   Weight as of this encounter: 65.817 kg (145 lb 1.6 oz).  PERFORMANCE STATUS (ECOG) : 3 - Symptomatic, >50% confined to bed  PHYSICAL EXAM: He is awake and sitting up in bed. He  is noted to be SLIGHTLY confused --but able to talk rationally about subjects as long as I refocus him. EOMI OP clear Neck w/o JVD or TM Hrt rrr no m Lungs with some ronchi on right Abd soft and nt Ext no mottling or cyanosis Large tattoo right arm  LABS: CBC:    Component Value Date/Time   WBC 18.7* 04/19/2016 0442   WBC 6.5 02/05/2015 1819   HGB 11.4* 04/19/2016 0442   HGB 17.3 02/05/2015 1819   HCT 34.0* 04/19/2016 0442   HCT 49.0 02/05/2015 1819   PLT 349 04/19/2016 0442   PLT 162 02/05/2015 1819   MCV 83.8 04/19/2016 0442   MCV 96 02/05/2015 1819   NEUTROABS 7.9* 03/23/2016 1818   NEUTROABS 5.6 03/17/2013 1908   LYMPHSABS 1.8 03/23/2016 1818   LYMPHSABS 1.8 03/17/2013 1908   MONOABS 1.5* 03/23/2016  1818   MONOABS 0.9 03/17/2013 1908   EOSABS 0.1 03/23/2016 1818   EOSABS 0.1 03/17/2013 1908   BASOSABS 0.0 03/23/2016 1818   BASOSABS 0.1 03/17/2013 1908   Comprehensive Metabolic Panel:    Component Value Date/Time   NA 138 04/19/2016 0442   NA 141 02/05/2015 1819   K 3.2* 04/19/2016 0442   K 3.7 02/05/2015 1819   CL 102 04/19/2016 0442   CL 107 02/05/2015 1819   CO2 29 04/19/2016 0442   CO2 23 02/05/2015 1819   BUN 9 04/19/2016 0442   BUN 7 02/05/2015 1819   CREATININE 0.61 04/19/2016 0442   CREATININE 0.78 02/05/2015 1819   GLUCOSE 100* 04/19/2016 0442   GLUCOSE 140* 02/05/2015 1819   CALCIUM 7.9* 04/19/2016 0442   CALCIUM 8.6* 02/05/2015 1819   AST 82* 04/14/2016 1448   AST 178* 02/05/2015 1819   ALT 36 04/14/2016 1448   ALT 186* 02/05/2015 1819   ALKPHOS 91 04/14/2016 1448   ALKPHOS 73 02/05/2015 1819   BILITOT 1.0 04/14/2016 1448   BILITOT 0.8 02/05/2015 1819   PROT 7.3 04/14/2016 1448   PROT 7.9 02/05/2015 1819   ALBUMIN 2.2* 04/14/2016 1448   ALBUMIN 4.1 02/05/2015 1819   CT ABD/ PELVIS 6/18: Necrotic appearing right-sided anterior mediastinal and right hilar lymphadenopathy. Extensive nodular soft tissue thickening of the right upper lobe pleura, highly suggestive for metastatic disease with dropped nodules along the medial pleura of the right middle lobe. The previously demonstrated right upper lobe pulmonary based mass has coalesced with the metastatic pleural thickening. Small right upper lobe 7 mm soft tissue nodule, likely neoplastic. Osseous metastatic disease with significant soft tissue component involving the right hand side of T11 vertebral body, and the posterior process of T1 vertebral body. Three large vaguely defined liver masses, which likely represent metastatic disease, difficult to measure due to extremely indistinct borders. Right adrenal mass, porta hepatis and mesenteric lymphadenopathy, highly suggestive of metastatic disease, and  increased in size. Indeterminate left renal lesion, which may represent a cyst. Periportal edema within the liver.  CT head 6/21 = neg for mets   -----------------------------------------------------  More than 50% of the visit was spent in counseling/coordination of care: Yes  Time Spent: 55 minutes

## 2016-04-19 NOTE — Progress Notes (Signed)
Spoke with Dr Osvaldo Angst about patient increased confusion at time, ammonia level ordered

## 2016-04-19 NOTE — Progress Notes (Signed)
[facility] INFECTIOUS DISE[facility] PROGRESS NOTE Date of Admission:  04/14/2016     ID: Dennis Perkins is a 61 y.o. male with  Metastatic lung cancer and fever  Active Problems:   Intractable nausea and vomiting   Protein-calorie malnutrition, severe   Pneumonia   Subjective: Last fever 6/22   ROS  Eleven systems are reviewed and negative except per hpi  Medications:  Antibiotics Given (last 72 hours)    Date/Time Action Medication Dose Rate   04/17/16 1249 Given   azithromycin (ZITHROMAX) 500 mg in dextrose 5 % 250 mL IVPB 500 mg 250 mL/hr   04/17/16 1405 Given   cefTRIAXone (ROCEPHIN) 1 g in dextrose 5 % 50 mL IVPB 1 g 100 mL/hr   04/18/16 1103 Given   Ampicillin-Sulbactam (UNASYN) 3 g in sodium chloride 0.9 % 100 mL IVPB 3 g 100 mL/hr   04/18/16 1810 Given   Ampicillin-Sulbactam (UNASYN) 3 g in sodium chloride 0.9 % 100 mL IVPB 3 g 100 mL/hr   04/18/16 2206 Given   Ampicillin-Sulbactam (UNASYN) 3 g in sodium chloride 0.9 % 100 mL IVPB 3 g 100 mL/hr   04/19/16 0406 Given   Ampicillin-Sulbactam (UNASYN) 3 g in sodium chloride 0.9 % 100 mL IVPB 3 g 100 mL/hr   04/19/16 1037 Given   Ampicillin-Sulbactam (UNASYN) 3 g in sodium chloride 0.9 % 100 mL IVPB 3 g 100 mL/hr     . ampicillin-sulbactam (UNASYN) IV  3 g Intravenous Q6H  . enoxaparin (LOVENOX) injection  40 mg Subcutaneous Q24H  . famotidine  20 mg Oral BID  . feeding supplement (ENSURE ENLIVE)  237 mL Oral BID BM  . fentaNYL  12.5 mcg Transdermal Q72H  . metoprolol tartrate  25 mg Oral BID  . senna-docusate  2 tablet Oral QHS    Objective: Vital signs in last 24 hours: Temp:  [94.8 F (34.9 C)-101.6 F (38.7 C)] 98.4 F (36.9 C) (06/23 1035) Pulse Rate:  [78-123] 93 (06/23 1035) Resp:  [16-20] 16 (06/23 1035) BP: (109-142)/(74-86) 116/75 mmHg (06/23 1035) SpO2:  [92 %-98 %] 95 % (06/23 1035) Weight:  [65.817 kg (145 lb 1.6 oz)] 65.817 kg (145 lb 1.6 oz) (06/23 0500) Constitutional: dishelved, slowed  mentation  HENT: anicteric, Mouth/Throat: Oropharynx is clear and dry, poor dentition . No oropharyngeal exudate.  Cardiovascular: Tachy reg  Pulmonary/Chest:poor air movement, decreased bs bil Abdominal: Soft. Bowel sounds are normal. He exhibits no distension. There is no tenderness.  Lymphadenopathy: He has no cervical adenopathy.  Neurological: slowed mentation  Skin: Skin is warm and dry. No rash noted. No erythema.  Psychiatric: flat affect  Lab Results  Recent Labs  04/17/16 0531 04/18/16 0508 04/19/16 0442  WBC 19.6* 18.1* 18.7*  HGB 10.6* 10.5* 11.4*  HCT 32.3* 31.2* 34.0*  NA 135  --  138  K 4.0  --  3.2*  CL 104  --  102  CO2 26  --  29  BUN 8  --  9  CREATININE 0.49*  --  0.61    Microbiology: Results for orders placed or performed during the hospital encounter of 04/14/16  CULTURE, BLOOD (ROUTINE X 2) w Reflex to ID Panel     Status: None (Preliminary result)   Collection Time: 04/16/16  5:49 AM  Result Value Ref Range Status   Specimen Description BLOOD RIGHT HAND  Final   Special Requests BOTTLES DRAWN AEROBIC AND ANAEROBIC 8ML  Final   Culture NO GROWTH 3 DAYS  Final  Report Status PENDING  Incomplete  CULTURE, BLOOD (ROUTINE X 2) w Reflex to ID Panel     Status: None (Preliminary result)   Collection Time: 04/16/16  5:53 AM  Result Value Ref Range Status   Specimen Description BLOOD RIGHT HAND  Final   Special Requests   Final    BOTTLES DRAWN AEROBIC AND ANAEROBIC AER 8ML ANA 10ML   Culture NO GROWTH 3 DAYS  Final   Report Status PENDING  Incomplete    Studies/Results: Ct Head Wo Contrast  04/17/2016  CLINICAL DATA:  Confusion, history of lung cancer EXAM: CT HEAD WITHOUT CONTRAST TECHNIQUE: Contiguous axial images were obtained from the base of the skull through the vertex without intravenous contrast. COMPARISON:  Brain MRI 03/24/2016 FINDINGS: No skull fracture is noted. Paranasal sinuses shows mild mucosal thickening left maxillary sinus.  No intracranial hemorrhage, mass effect or midline shift. No acute cortical infarction. No mass lesion is noted on this unenhanced scan. No intraventricular hemorrhage. No vascular calcifications are noted. IMPRESSION: No acute intracranial abnormality. There is mucosal thickening left maxillary sinus. No definite acute cortical infarction. Electronically Signed   By: Lahoma Crocker M.D.   On: 04/17/2016 16:43   US Venous Img Lower Bilateral  04/18/2016  CLINICAL DATA:  Bilateral lower extremity swelling with fever for 2 days. EXAM: BILATERAL LOWER EXTREMITY VENOUS DOPPLER ULTRASOUND TECHNIQUE: Gray-scale sonography with graded compression, as well as color Doppler and duplex ultrasound were performed to evaluate the lower extremity deep venous systems from the level of the common femoral vein and including the common femoral, femoral, profunda femoral, popliteal and calf veins including the posterior tibial, peroneal and gastrocnemius veins when visible. The superficial great saphenous vein was also interrogated. Spectral Doppler was utilized to evaluate flow at rest and with distal augmentation maneuvers in the common femoral, femoral and popliteal veins. COMPARISON:  None. FINDINGS: RIGHT LOWER EXTREMITY Common Femoral Vein: No evidence of thrombus. Normal compressibility, respiratory phasicity and response to augmentation. Saphenofemoral Junction: No evidence of thrombus. Normal compressibility and flow on color Doppler imaging. Profunda Femoral Vein: No evidence of thrombus. Normal compressibility and flow on color Doppler imaging. Femoral Vein: No evidence of thrombus. Normal compressibility, respiratory phasicity and response to augmentation. Popliteal Vein: No evidence of thrombus. Normal compressibility, respiratory phasicity and response to augmentation. Calf Veins: No evidence of thrombus. Normal compressibility and flow on color Doppler imaging. Superficial Great Saphenous Vein: No evidence of thrombus.  Normal compressibility and flow on color Doppler imaging. Venous Reflux:  None. Other Findings:  None. LEFT LOWER EXTREMITY Common Femoral Vein: No evidence of thrombus. Normal compressibility, respiratory phasicity and response to augmentation. Saphenofemoral Junction: No evidence of thrombus. Normal compressibility and flow on color Doppler imaging. Profunda Femoral Vein: No evidence of thrombus. Normal compressibility and flow on color Doppler imaging. Femoral Vein: No evidence of thrombus. Normal compressibility, respiratory phasicity and response to augmentation. Popliteal Vein: No evidence of thrombus. Normal compressibility, respiratory phasicity and response to augmentation. Calf Veins: No evidence of thrombus. Normal compressibility and flow on color Doppler imaging. Superficial Great Saphenous Vein: No evidence of thrombus. Normal compressibility and flow on color Doppler imaging. Venous Reflux:  None. Other Findings:  None. IMPRESSION: No evidence of deep venous thrombosis. Electronically Signed   By: Jerilynn Mages.  Shick M.D.   On: 04/18/2016 17:21    Assessment/Plan: Dennis Perkins is a 60 y.o. male with metastatic lung cancer admitted with chest pain from mets to chest then with fever and elevated WBC.  Based on sxs and imaging I suspect he has pulmonary process, possibly post obstructive PNA. [place] neg. Does have hx ETOH abuse so could be Withdrawal but apparently has not drunk is several weeks.  Recommendations Would continue  unasyn and then can dc on augmentin when stable Will likely continue to have fevers given necrotic tumor Being considered for  palliative radiation if can be done prior to dc Thank you very much for the consult. Will follow with you.  FITZGERALD, DAVID P   04/19/2016, 4:19 PM

## 2016-04-19 NOTE — Consult Note (Signed)
Except an outstanding is perfect of Radiation Oncology NEW PATIENT EVALUATION  Name: Dennis Perkins  MRN: [id number]  Date:   04/14/2016     DOB: 10/31/1955   This 60 y.o. 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 provider found  CHIEF COMPLAINT:  Chief Complaint  Patient presents with  . Emesis  . Diarrhea  . Nausea    DIAGNOSIS: The primary encounter diagnosis was Non-intractable vomiting with nausea, vomiting of unspecified type. Diagnoses of Leukocytosis, Hypokalemia, Metastatic cancer ([place]), Confusion, Lung cancer ([place]), Swelling, and Fever were also pertinent to this visit.   PREVIOUS INVESTIGATIONS:  CT scans of chest abdomen and pelvis reviewed Clinical notes reviewed Pathology reports reviewed  HPI: Patient is a 60 year old male recently diagnosed with stage IV adenocarcinoma the lung with metastatic disease to both his liver and bones. He was recently admitted with intractable nausea vomiting and back pain. Also has recent history of fever. Initially on this admission he had acute confusion most likely secondary to metabolic encephalopathy MRI scan of his brain as an outpatient was negative for metastatic disease. He does have postobstructive pneumonia and is spiking some high fevers. His major complaint is right-sided chest pain and right upper quadrant pain. CT scan shows necrotic appearing right sided anterior mediastinal and right hilar lymphadenopathy as well as extensive nodular soft tissue thickening of the right upper lobe. He does have significant metastatic disease in the T11 vertebral body and the posterior process of T1. I been asked to evaluate the patient as an inpatient for possible palliative radiation. He is seen today in his hospital room still heavily sedated and slightly confused.  PLANNED  TREATMENT REGIMEN: Palliative radiation therapy to chest  PAST MEDICAL HISTORY:  has a past medical history of Lung cancer ([place]); COPD (chronic obstructive pulmonary disease) ([place]); and Essential hypertension.    PAST SURGICAL HISTORY:  Past Surgical History  Procedure Laterality Date  . I&d extremity Right 03/17/2013    Procedure: IRRIGATION AND DEBRIDEMENT EXTREMITY, flexor tendon repair;  Surgeon: Linna Hoff, MD;  Location: [facility];  Service: Orthopedics;  Laterality: Right;  . Knee surgery Bilateral     FAMILY HISTORY: family history includes Breast cancer in his mother; Lung cancer in his father.  SOCIAL HISTORY:  reports that he has been smoking Cigarettes.  He has a 20 pack-year smoking history. He has never used smokeless tobacco. He reports that he drinks about 16.8 oz of alcohol per week. He reports that he does not use illicit drugs.  ALLERGIES: Levaquin  MEDICATIONS:  Current Facility-Administered Medications  Medication Dose Route Frequency Provider Last Rate Last Dose  . 0.9 %  sodium chloride infusion   Intravenous Continuous Gladstone Lighter, MD 75 mL/hr at 04/19/16 0740    . acetaminophen (TYLENOL) tablet 650 mg  650 mg Oral Q6H PRN Henreitta Leber, MD   650 mg at 04/18/16 2207   Or  . acetaminophen (TYLENOL) suppository 650 mg  650 mg Rectal Q6H PRN Henreitta Leber, MD      . Ampicillin-Sulbactam (UNASYN) 3 g in sodium chloride 0.9 % 100 mL IVPB  3 g Intravenous Q6H Melissa D Maccia, RPH   3 g at 04/19/16 0406  . enoxaparin (LOVENOX) injection 40 mg  40 mg Subcutaneous Q24H Henreitta Leber, MD   40 mg at 04/18/16 2207  .  famotidine (PEPCID) tablet 20 mg  20 mg Oral BID Henreitta Leber, MD   20 mg at 04/18/16 2207  . feeding supplement (ENSURE ENLIVE) (ENSURE ENLIVE) liquid 237 mL  237 mL Oral BID BM Gladstone Lighter, MD   237 mL at 04/18/16 1400  . fentaNYL (DURAGESIC - dosed mcg/hr) 12.5 mcg  12.5 mcg Transdermal Q72H Gladstone Lighter, MD   12.5 mcg at 04/18/16  1242  . HYDROcodone-acetaminophen (NORCO) 10-325 MG per tablet 1 tablet  1 tablet Oral Q4H PRN Gladstone Lighter, MD   1 tablet at 04/19/16 0136  . magnesium hydroxide (MILK OF MAGNESIA) suspension 30 mL  30 mL Oral Daily PRN Gladstone Lighter, MD   30 mL at 04/16/16 0931  . metoprolol tartrate (LOPRESSOR) tablet 25 mg  25 mg Oral BID Henreitta Leber, MD   25 mg at 04/18/16 2207  . nitroGLYCERIN (NITROSTAT) SL tablet 0.4 mg  0.4 mg Sublingual Q5 min PRN Max Sane, MD   0.4 mg at 04/14/16 2348  . ondansetron (ZOFRAN) tablet 4 mg  4 mg Oral Q6H PRN Henreitta Leber, MD       Or  . ondansetron [facility]) injection 4 mg  4 mg Intravenous Q6H PRN Henreitta Leber, MD   4 mg at 04/14/16 2141  . polyethylene glycol (MIRALAX / GLYCOLAX) packet 17 g  17 g Oral Daily PRN Henreitta Leber, MD   17 g at 04/16/16 0931  . potassium chloride SA (K-DUR,KLOR-CON) CR tablet 40 mEq  40 mEq Oral Once Gladstone Lighter, MD      . senna-docusate (Senokot-S) tablet 2 tablet  2 tablet Oral QHS Gladstone Lighter, MD   2 tablet at 04/18/16 2207    ECOG PERFORMANCE STATUS:  2 - Symptomatic, <50% confined to bed  REVIEW OF SYSTEMS: According to nurse's notes  Patient denies any weight loss, fatigue, weakness, fever, chills or night sweats. Patient denies any loss of vision, blurred vision. Patient denies any ringing  of the ears or hearing loss. No irregular heartbeat. Patient denies heart murmur or history of fainting. Patient denies any chest pain or pain radiating to her upper extremities. Patient denies any shortness of breath, difficulty breathing at night, cough or hemoptysis. Patient denies any swelling in the lower legs. Patient denies any nausea vomiting, vomiting of blood, or coffee ground material in the vomitus. Patient denies any stomach pain. Patient states has had normal bowel movements no significant constipation or diarrhea. Patient denies any dysuria, hematuria or significant nocturia. Patient denies any problems  walking, swelling in the joints or loss of balance. Patient denies any skin changes, loss of hair or loss of weight. Patient denies any excessive worrying or anxiety or significant depression. Patient denies any problems with insomnia. Patient denies excessive thirst, polyuria, polydipsia. Patient denies any swollen glands, patient denies easy bruising or easy bleeding. Patient denies any recent infections, allergies or URI. Patient "s visual fields have not changed significantly in recent time.   PHYSICAL EXAM: BP 109/74 mmHg  Pulse 78  Temp(Src) 97.6 F (36.4 C) (Oral)  Resp 16  Ht '5\' 9"'$  (1.753 m)  Wt 145 lb 1.6 oz (65.817 kg)  BMI 21.42 kg/m2  SpO2 98% Heavily sedated male seen in his hospital bed in moderate pain discomfort. Motor sensory and DTR levels are equal and symmetric in the upper lower extremities. Proprioception is intact. Well-developed well-nourished patient in NAD. HEENT reveals PERLA, EOMI, discs not visualized.  Oral cavity is clear. No oral mucosal  lesions are identified. Neck is clear without evidence of cervical or supraclavicular adenopathy. Lungs are clear to A&P. Cardiac examination is essentially unremarkable with regular rate and rhythm without murmur rub or thrill. Abdomen is benign with no organomegaly or masses noted. Motor sensory and DTR levels are equal and symmetric in the upper and lower extremities. Cranial nerves II through XII are grossly intact. Proprioception is intact. No peripheral adenopathy or edema is identified. No motor or sensory levels are noted. Crude visual fields are within normal range.  LABORATORY DATA: Portable or pleural-based biopsy shows adenocarcinoma on review    RADIOLOGY RESULTS: CT scans chest abdomen and pelvis are reviewed and compatible with the above-stated findings. 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  PLAN: At this time like to offer  palliative radiation therapy. I would plan on delivering 3000 cGy in 10 fractions. I would try to include both thoracic lesions as well as some of his mediastinal adenopathy and some of the right upper lobe areas of significant tumor deposit. Risks and benefits of treatment including potential radiation esophagitis, fatigue, alteration of blood counts, skin reaction all were discussed in detail with the patient. I first thing ordered CT simulation for first thing next week. Will discuss his case with medical oncology.  I would like to take this opportunity to thank you for allowing me to participate in the care of your patient.Armstead Peaks., MD

## 2016-04-19 NOTE — Consult Note (Signed)
Pharmacy Antibiotic Note  Dennis Perkins is a 60 y.o. male admitted on 04/14/2016 with post obstructive PNA.  Pharmacy has been consulted for unasyn dosing.  Plan: Continue unasyn 3g q 6 hours  Height: '5\' 9"'$  (175.3 cm) Weight: 145 lb 1.6 oz (65.817 kg) (pt bed was weighed with 2 pillows) IBW/kg (Calculated) : 70.7  Temp (24hrs), Avg:97.8 F (36.6 C), Min:94.8 F (34.9 C), Max:101.6 F (38.7 C)   Recent Labs Lab 04/14/16 1448 04/15/16 0457 04/16/16 0549 04/17/16 0531 04/18/16 0508 04/19/16 0442 04/19/16 0809 04/19/16 1027  WBC 25.9* 22.8* 21.2* 19.6* 18.1* 18.7*  --   --   CREATININE 0.69 0.59* 0.58* 0.49*  --  0.61  --   --   LATICACIDVEN  --   --   --   --   --   --  2.1* 2.9*    Estimated Creatinine Clearance: 92.5 mL/min (by C-G formula based on Cr of 0.61).    Allergies  Allergen Reactions  . Levaquin [Levofloxacin] Other (See Comments)    confusion    Antimicrobials this admission: Ceftriaxone 6/20>> 6/22 Azithromycin 6/20>>6/22 unasyn 6/22   Dose adjustments this admission:   Microbiology results: 6/20 BCx: ng 2 days   Thank you for allowing pharmacy to be a part of this patient's care.  Ramond Dial, Pharm.D Clinical Pharmacist  04/19/2016 2:04 PM

## 2016-04-19 NOTE — Progress Notes (Signed)
[place] at [place] NAME: Dennis Perkins    MR#:  [id number]  DATE OF BIRTH:  1955/12/05  SUBJECTIVE:  CHIEF COMPLAINT:   Chief Complaint  Patient presents with  . Emesis  . Diarrhea  . Nausea   - Continues to spike fever until last night, likely malignancy related fevers. However after ibuprofen, temperature dropped and currently on a bair hugger - The patient is more alert after decreasing his fentanyl dose  REVIEW OF SYSTEMS:  Review of Systems  Unable to perform ROS: mental acuity    DRUG ALLERGIES:   Allergies  Allergen Reactions  . Levaquin [Levofloxacin] Other (See Comments)    confusion    VITALS:  Blood pressure 109/74, pulse 78, temperature 94.9 F (34.9 C), temperature source Rectal, resp. rate 16, height '5\' 9"'$  (1.753 m), weight 65.817 kg (145 lb 1.6 oz), SpO2 98 %.  PHYSICAL EXAMINATION:  Physical Exam  GENERAL:  60 y.o.-year-old patient lying in the bed with no acute distress.  EYES: Pupils equal, round, reactive to light and accommodation. No scleral icterus. Extraocular muscles intact.  HEENT: Head atraumatic, normocephalic. Oropharynx and nasopharynx clear.  NECK:  Supple, no jugular venous distention. No thyroid enlargement, no tenderness.  LUNGS: Normal breath sounds bilaterally, no wheezing, rales,rhonchi or crepitation. No use of accessory muscles of respiration. Decreased right upper lobe breath sounds. CARDIOVASCULAR: S1, S2 normal. No murmurs, rubs, or gallops.  ABDOMEN: Soft, nontender, nondistended. Bowel sounds present. No organomegaly or mass.  EXTREMITIES: No pedal edema, cyanosis, or clubbing.  NEUROLOGIC: More alert and communicating, asking appropriate questions today Following commands, motor strength 5/5 and no sensory deficits  PSYCHIATRIC: The patient is more alert and oriented x 2-3, appears depressed, no suicidal ideation  SKIN: No obvious rash, lesion, or ulcer.    LABORATORY  PANEL:   CBC  Recent Labs Lab 04/19/16 0442  WBC 18.7*  HGB 11.4*  HCT 34.0*  PLT 349   ------------------------------------------------------------------------------------------------------------------  Chemistries   Recent Labs Lab 04/14/16 1448  04/19/16 0442  NA 130*  < > 138  K 2.6*  < > 3.2*  CL 88*  < > 102  CO2 29  < > 29  GLUCOSE 117*  < > 100*  BUN 11  < > 9  CREATININE 0.69  < > 0.61  CALCIUM 8.2*  < > 7.9*  MG 1.6*  < > 1.6*  AST 82*  --   --   ALT 36  --   --   ALKPHOS 91  --   --   BILITOT 1.0  --   --   < > = values in this interval not displayed. ------------------------------------------------------------------------------------------------------------------  Cardiac Enzymes No results for input(s): TROPONINI in the last 168 hours. ------------------------------------------------------------------------------------------------------------------  RADIOLOGY:  Ct Head Wo Contrast  04/17/2016  CLINICAL DATA:  Confusion, history of lung cancer EXAM: CT HEAD WITHOUT CONTRAST TECHNIQUE: Contiguous axial images were obtained from the base of the skull through the vertex without intravenous contrast. COMPARISON:  Brain MRI 03/24/2016 FINDINGS: No skull fracture is noted. Paranasal sinuses shows mild mucosal thickening left maxillary sinus. No intracranial hemorrhage, mass effect or midline shift. No acute cortical infarction. No mass lesion is noted on this unenhanced scan. No intraventricular hemorrhage. No vascular calcifications are noted. IMPRESSION: No acute intracranial abnormality. There is mucosal thickening left maxillary sinus. No definite acute cortical infarction. Electronically Signed   By: Lahoma Crocker M.D.   On: 04/17/2016 16:43  US Venous Img Lower Bilateral  04/18/2016  CLINICAL DATA:  Bilateral lower extremity swelling with fever for 2 days. EXAM: BILATERAL LOWER EXTREMITY VENOUS DOPPLER ULTRASOUND TECHNIQUE: Gray-scale sonography with graded  compression, as well as color Doppler and duplex ultrasound were performed to evaluate the lower extremity deep venous systems from the level of the common femoral vein and including the common femoral, femoral, profunda femoral, popliteal and calf veins including the posterior tibial, peroneal and gastrocnemius veins when visible. The superficial great saphenous vein was also interrogated. Spectral Doppler was utilized to evaluate flow at rest and with distal augmentation maneuvers in the common femoral, femoral and popliteal veins. COMPARISON:  None. FINDINGS: RIGHT LOWER EXTREMITY Common Femoral Vein: No evidence of thrombus. Normal compressibility, respiratory phasicity and response to augmentation. Saphenofemoral Junction: No evidence of thrombus. Normal compressibility and flow on color Doppler imaging. Profunda Femoral Vein: No evidence of thrombus. Normal compressibility and flow on color Doppler imaging. Femoral Vein: No evidence of thrombus. Normal compressibility, respiratory phasicity and response to augmentation. Popliteal Vein: No evidence of thrombus. Normal compressibility, respiratory phasicity and response to augmentation. Calf Veins: No evidence of thrombus. Normal compressibility and flow on color Doppler imaging. Superficial Great Saphenous Vein: No evidence of thrombus. Normal compressibility and flow on color Doppler imaging. Venous Reflux:  None. Other Findings:  None. LEFT LOWER EXTREMITY Common Femoral Vein: No evidence of thrombus. Normal compressibility, respiratory phasicity and response to augmentation. Saphenofemoral Junction: No evidence of thrombus. Normal compressibility and flow on color Doppler imaging. Profunda Femoral Vein: No evidence of thrombus. Normal compressibility and flow on color Doppler imaging. Femoral Vein: No evidence of thrombus. Normal compressibility, respiratory phasicity and response to augmentation. Popliteal Vein: No evidence of thrombus. Normal  compressibility, respiratory phasicity and response to augmentation. Calf Veins: No evidence of thrombus. Normal compressibility and flow on color Doppler imaging. Superficial Great Saphenous Vein: No evidence of thrombus. Normal compressibility and flow on color Doppler imaging. Venous Reflux:  None. Other Findings:  None. IMPRESSION: No evidence of deep venous thrombosis. Electronically Signed   By: Jerilynn Mages.  Shick M.D.   On: 04/18/2016 17:21    EKG:   Orders placed or performed during the hospital encounter of 04/14/16  . EKG 12-Lead  . EKG 12-Lead    ASSESSMENT AND PLAN:   60 year old male with history of COPD, Non-small cell lung cancer with extensive metastatic disease diagnosed at the end of May 2017, essential hypertension presented to the hospital due to intractable nausea and vomiting and also right-sided chest pain from his malignancy.  1.Acute confusion/ AMS- toxic metabolic encephalopathy from pain patch - decreased fentanyl patch dose-much improved mental status now - CT of head without any acute findings - recent MRI of the brain as outpatient was negative for any metastases. - monitor closely  2. Post obstructive pneumonia- with high-grade fevers, blood cultures negative. -CT chest and abdomen on admission showing right upper lobe malignant disease and diffuse metastasis, -ABX changed to Unasyn, appreciate ID consult - Fevers could be from his underlying malignant disease as well. However WBC count is also significantly high- monitor -Became hypothermic after Motrin. Continue to monitor. Lactic acid is elevated  3. Right-sided chest pain and right upper quadrant abdominal pain-extensive metastatic disease from his lung cancer which is on the right side.  Also bone metastases and liver metastases present. -decreased fentanyl patch due to AMS. - radiation oncology consult to see if palliative radiation would help with the pain.   4. Non-small cell lung  cancer-diagnosed at the  end of May 2017. Extensive metastatic disease noted as well. MRI of the brain is negative. -Oncology consulted. Chemotherapy once more stable Palliative care consulted for code status discussion  5. Essential hypertension-continue metoprolol.  6. DVT prophylaxis-on Lovenox   All the records are reviewed and case discussed with Care Management/Social Workerr. Management plans discussed with the patient, family and they are in agreement.  CODE STATUS: Full Code  TOTAL TIME TAKING CARE OF THIS PATIENT: 37 minutes.   POSSIBLE D/C IN 2-3 DAYS, DEPENDING ON CLINICAL CONDITION.   Gladstone Lighter M.D on 04/19/2016 at 8:50 AM  Between 7am to 6pm - Pager - [phone]  After 6pm go to [email or web address] - password [email or web address] [facility]  Office  [phone]  CC: Primary care physician; Default, Provider, MD

## 2016-04-20 LAB — CBC
HEMATOCRIT: 33.8 % — AB (ref 40.0–52.0)
HEMOGLOBIN: 11.1 g/dL — AB (ref 13.0–18.0)
MCH: 27.4 pg (ref 26.0–34.0)
MCHC: 32.7 g/dL (ref 32.0–36.0)
MCV: 83.9 fL (ref 80.0–100.0)
Platelets: 355 10*3/uL (ref 150–440)
RBC: 4.03 MIL/uL — AB (ref 4.40–5.90)
RDW: 14.9 % — ABNORMAL HIGH (ref 11.5–14.5)
WBC: 22.6 10*3/uL — ABNORMAL HIGH (ref 3.8–10.6)

## 2016-04-20 LAB — BASIC METABOLIC PANEL
ANION GAP: 8 (ref 5–15)
BUN: 9 mg/dL (ref 6–20)
CO2: 27 mmol/L (ref 22–32)
CREATININE: 0.58 mg/dL — AB (ref 0.61–1.24)
Calcium: 7.5 mg/dL — ABNORMAL LOW (ref 8.9–10.3)
Chloride: 105 mmol/L (ref 101–111)
GFR calc non Af Amer: >60 mL/min (ref >60)
Glucose, Bld: 96 mg/dL (ref 65–99)
POTASSIUM: 3.3 mmol/L — AB (ref 3.5–5.1)
SODIUM: 140 mmol/L (ref 135–145)

## 2016-04-20 MED ORDER — POTASSIUM CHLORIDE CRYS ER 20 MEQ PO TBCR
20.0000 meq | EXTENDED_RELEASE_TABLET | Freq: Two times a day (BID) | ORAL | Status: DC
  Administered 2016-04-20 (×2): 20 meq via ORAL
  Filled 2016-04-20 (×2): qty 1

## 2016-04-20 NOTE — Progress Notes (Signed)
[place] at [place] NAME: Dennis Perkins    MR#:  [id number]  DATE OF BIRTH:  06-Nov-1955  SUBJECTIVE:  CHIEF COMPLAINT:   Chief Complaint  Patient presents with  . Emesis  . Diarrhea  . Nausea   - Continues to spike fever until 04/18/16 night, likely malignancy related fevers.  Now no more fever in last 24 hrs. - The patient is more alert after decreasing his fentanyl dose - able to walk in room without trouble, very weak overall. REVIEW OF SYSTEMS:  Review of Systems  Constitutional: Positive for malaise/fatigue. Negative for fever and chills.  HENT: Negative for hearing loss.   Eyes: Negative for blurred vision and double vision.  Respiratory: Negative for cough, shortness of breath and wheezing.   Cardiovascular: Negative for chest pain, palpitations and leg swelling.  Gastrointestinal: Negative for nausea, vomiting and abdominal pain.  Genitourinary: Negative for dysuria and frequency.  Musculoskeletal: Negative for myalgias and falls.  Skin: Negative for rash.  Neurological: Negative for dizziness, tremors, focal weakness and headaches.    DRUG ALLERGIES:   Allergies  Allergen Reactions  . Levaquin [Levofloxacin] Other (See Comments)    confusion    VITALS:  Blood pressure 139/93, pulse 111, temperature 98.8 F (37.1 C), temperature source Oral, resp. rate 24, height '5\' 9"'$  (1.753 m), weight 67.858 kg (149 lb 9.6 oz), SpO2 95 %.  PHYSICAL EXAMINATION:  Physical Exam  GENERAL:  60 y.o.-year-old patient lying in the bed with no acute distress.  EYES: Pupils equal, round, reactive to light and accommodation. No scleral icterus. Extraocular muscles intact.  HEENT: Head atraumatic, normocephalic. Oropharynx and nasopharynx clear.  NECK:  Supple, no jugular venous distention. No thyroid enlargement, no tenderness.  LUNGS: Normal breath sounds bilaterally, no wheezing, rales,rhonchi or crepitation. No use of accessory  muscles of respiration. Decreased right upper lobe breath sounds. CARDIOVASCULAR: S1, S2 normal. No murmurs, rubs, or gallops.  ABDOMEN: Soft, nontender, nondistended. Bowel sounds present. No organomegaly or mass.  EXTREMITIES: No pedal edema, cyanosis, or clubbing.  NEUROLOGIC: More alert and communicating, asking appropriate questions today Following commands, motor strength 5/5 and no sensory deficits  PSYCHIATRIC: The patient is more alert and oriented x 3, appears depressed, no suicidal ideation  SKIN: No obvious rash, lesion, or ulcer.    LABORATORY PANEL:   CBC  Recent Labs Lab 04/20/16 0555  WBC 22.6*  HGB 11.1*  HCT 33.8*  PLT 355   ------------------------------------------------------------------------------------------------------------------  Chemistries   Recent Labs Lab 04/14/16 1448  04/19/16 0442 04/20/16 0555  NA 130*  < > 138 140  K 2.6*  < > 3.2* 3.3*  CL 88*  < > 102 105  CO2 29  < > 29 27  GLUCOSE 117*  < > 100* 96  BUN 11  < > 9 9  CREATININE 0.69  < > 0.61 0.58*  CALCIUM 8.2*  < > 7.9* 7.5*  MG 1.6*  < > 1.6*  --   AST 82*  --   --   --   ALT 36  --   --   --   ALKPHOS 91  --   --   --   BILITOT 1.0  --   --   --   < > = values in this interval not displayed. ------------------------------------------------------------------------------------------------------------------  Cardiac Enzymes No results for input(s): TROPONINI in the last 168 hours. ------------------------------------------------------------------------------------------------------------------  RADIOLOGY:  US Venous Img Lower Bilateral  04/18/2016  CLINICAL DATA:  Bilateral lower extremity swelling with fever for 2 days. EXAM: BILATERAL LOWER EXTREMITY VENOUS DOPPLER ULTRASOUND TECHNIQUE: Gray-scale sonography with graded compression, as well as color Doppler and duplex ultrasound were performed to evaluate the lower extremity deep venous systems from the level of the common  femoral vein and including the common femoral, femoral, profunda femoral, popliteal and calf veins including the posterior tibial, peroneal and gastrocnemius veins when visible. The superficial great saphenous vein was also interrogated. Spectral Doppler was utilized to evaluate flow at rest and with distal augmentation maneuvers in the common femoral, femoral and popliteal veins. COMPARISON:  None. FINDINGS: RIGHT LOWER EXTREMITY Common Femoral Vein: No evidence of thrombus. Normal compressibility, respiratory phasicity and response to augmentation. Saphenofemoral Junction: No evidence of thrombus. Normal compressibility and flow on color Doppler imaging. Profunda Femoral Vein: No evidence of thrombus. Normal compressibility and flow on color Doppler imaging. Femoral Vein: No evidence of thrombus. Normal compressibility, respiratory phasicity and response to augmentation. Popliteal Vein: No evidence of thrombus. Normal compressibility, respiratory phasicity and response to augmentation. Calf Veins: No evidence of thrombus. Normal compressibility and flow on color Doppler imaging. Superficial Great Saphenous Vein: No evidence of thrombus. Normal compressibility and flow on color Doppler imaging. Venous Reflux:  None. Other Findings:  None. LEFT LOWER EXTREMITY Common Femoral Vein: No evidence of thrombus. Normal compressibility, respiratory phasicity and response to augmentation. Saphenofemoral Junction: No evidence of thrombus. Normal compressibility and flow on color Doppler imaging. Profunda Femoral Vein: No evidence of thrombus. Normal compressibility and flow on color Doppler imaging. Femoral Vein: No evidence of thrombus. Normal compressibility, respiratory phasicity and response to augmentation. Popliteal Vein: No evidence of thrombus. Normal compressibility, respiratory phasicity and response to augmentation. Calf Veins: No evidence of thrombus. Normal compressibility and flow on color Doppler imaging.  Superficial Great Saphenous Vein: No evidence of thrombus. Normal compressibility and flow on color Doppler imaging. Venous Reflux:  None. Other Findings:  None. IMPRESSION: No evidence of deep venous thrombosis. Electronically Signed   By: Jerilynn Mages.  Shick M.D.   On: 04/18/2016 17:21    EKG:   Orders placed or performed during the hospital encounter of 04/14/16  . EKG 12-Lead  . EKG 12-Lead    ASSESSMENT AND PLAN:   60 year old male with history of COPD, Non-small cell lung cancer with extensive metastatic disease diagnosed at the end of May 2017, essential hypertension presented to the hospital due to intractable nausea and vomiting and also right-sided chest pain from his malignancy.  1.Acute confusion/ AMS- toxic metabolic encephalopathy from pain patch - decreased fentanyl patch dose-much improved mental status now - CT of head without any acute findings - recent MRI of the brain as outpatient was negative for any metastases. - monitor closely  2. Post obstructive pneumonia- with high-grade fevers, blood cultures negative. -CT chest and abdomen on admission showing right upper lobe malignant disease and diffuse metastasis, -ABX changed to Unasyn, appreciate ID consult - Fevers could be from his underlying malignant disease as well. However WBC count is also significantly high- monitor -Became hypothermic after Motrin. Continue to monitor. Lactic acid is elevated - blood culture is negative.  3. Right-sided chest pain and right upper quadrant abdominal pain-extensive metastatic disease from his lung cancer which is on the right side.  Also bone metastases and liver metastases present. -decreased fentanyl patch due to AMS. - radiation oncology consult to see if palliative radiation would help with the pain.  - planned radiation on Monday.  4. Non-small  cell lung cancer-diagnosed at the end of May 2017. Extensive metastatic disease noted as well. MRI of the brain is negative. -Oncology  consulted. Chemotherapy once more stable Palliative care consulted for code status discussion  5. Essential hypertension-continue metoprolol.  6. DVT prophylaxis-on Lovenox  7. hypokalemia   Replace orally.  All the records are reviewed and case discussed with Care Management/Social Workerr. Management plans discussed with the patient, family and they are in agreement.  CODE STATUS: Full Code  TOTAL TIME TAKING CARE OF THIS PATIENT: 30 minutes.   POSSIBLE D/C IN 2-3 DAYS, DEPENDING ON CLINICAL CONDITION.   Vaughan Basta M.D on 04/20/2016 at 9:49 AM  Between 7am to 6pm - Pager - [phone]  After 6pm go to [email or web address] - password [email or web address] [facility]  Office  [phone]  CC: Primary care physician; Default, Provider, MD

## 2016-04-20 NOTE — Progress Notes (Signed)
Paged prime doc to report pt weight increase. Still awaiting call back.

## 2016-04-21 LAB — BASIC METABOLIC PANEL
Anion gap: 10 (ref 5–15)
BUN: 8 mg/dL (ref 6–20)
CALCIUM: 7.7 mg/dL — AB (ref 8.9–10.3)
CO2: 25 mmol/L (ref 22–32)
Chloride: 102 mmol/L (ref 101–111)
Creatinine, Ser: 0.63 mg/dL (ref 0.61–1.24)
GFR calc Af Amer: >60 mL/min (ref >60)
GLUCOSE: 98 mg/dL (ref 65–99)
Potassium: 3 mmol/L — ABNORMAL LOW (ref 3.5–5.1)
Sodium: 137 mmol/L (ref 135–145)

## 2016-04-21 LAB — CULTURE, BLOOD (ROUTINE X 2)
CULTURE: NO GROWTH
CULTURE: NO GROWTH

## 2016-04-21 LAB — MAGNESIUM: MAGNESIUM: 1.5 mg/dL — AB (ref 1.7–2.4)

## 2016-04-21 LAB — CBC
HCT: 32.4 % — ABNORMAL LOW (ref 40.0–52.0)
Hemoglobin: 10.8 g/dL — ABNORMAL LOW (ref 13.0–18.0)
MCH: 27.7 pg (ref 26.0–34.0)
MCHC: 33.3 g/dL (ref 32.0–36.0)
MCV: 83.2 fL (ref 80.0–100.0)
Platelets: 365 10*3/uL (ref 150–440)
RBC: 3.9 MIL/uL — ABNORMAL LOW (ref 4.40–5.90)
RDW: 15.1 % — AB (ref 11.5–14.5)
WBC: 23.3 10*3/uL — ABNORMAL HIGH (ref 3.8–10.6)

## 2016-04-21 MED ORDER — HYDROCODONE-ACETAMINOPHEN 10-325 MG PO TABS
1.0000 | ORAL_TABLET | Freq: Four times a day (QID) | ORAL | Status: DC | PRN
  Administered 2016-04-21 – 2016-04-23 (×5): 1 via ORAL
  Filled 2016-04-21 (×5): qty 1

## 2016-04-21 MED ORDER — MORPHINE SULFATE (PF) 2 MG/ML IV SOLN
2.0000 mg | INTRAVENOUS | Status: DC | PRN
  Administered 2016-04-21 (×4): 2 mg via INTRAVENOUS
  Filled 2016-04-21 (×4): qty 1

## 2016-04-21 MED ORDER — MAGNESIUM SULFATE 2 GM/50ML IV SOLN
2.0000 g | Freq: Once | INTRAVENOUS | Status: AC
  Administered 2016-04-21: 10:00:00 2 g via INTRAVENOUS
  Filled 2016-04-21: qty 50

## 2016-04-21 MED ORDER — MORPHINE SULFATE (PF) 2 MG/ML IV SOLN
2.0000 mg | Freq: Once | INTRAVENOUS | Status: AC
  Administered 2016-04-21: 23:00:00 2 mg via INTRAVENOUS

## 2016-04-21 MED ORDER — MORPHINE SULFATE (PF) 2 MG/ML IV SOLN
INTRAVENOUS | Status: AC
  Filled 2016-04-21: qty 1

## 2016-04-21 MED ORDER — POTASSIUM CHLORIDE CRYS ER 20 MEQ PO TBCR
40.0000 meq | EXTENDED_RELEASE_TABLET | Freq: Two times a day (BID) | ORAL | Status: DC
  Administered 2016-04-21 – 2016-04-25 (×7): 40 meq via ORAL
  Filled 2016-04-21 (×10): qty 2

## 2016-04-21 NOTE — Progress Notes (Signed)
Pt trying to leave, take off gown. Pt confused.  Attempted to reorient with limited success.  States "I have to get out of here".  Pt not able to lay back but sits upright in bed.  Per PAINAD 8/10.  Pt has received MS and Norco with no relief this evening.  Called Dr Claria Dice and received one time order for additional 2 mg MS.  Pt sitting in bed with bed alarm on at this time. Dorna Bloom RN

## 2016-04-21 NOTE — Progress Notes (Signed)
[place] at [place] NAME: Dennis Perkins    MR#:  [id number]  DATE OF BIRTH:  1955/11/22  SUBJECTIVE:  CHIEF COMPLAINT:   Chief Complaint  Patient presents with  . Emesis  . Diarrhea  . Nausea   - Continues to spike fever until 04/18/16 night, likely malignancy related fevers.  Now no more fever in last 24 hrs. - The patient is more alert after decreasing his fentanyl dose - able to walk in room without trouble, very weak overall. - Asking to have a letter, that he does not have brain mets.  REVIEW OF SYSTEMS:  Review of Systems  Constitutional: Positive for malaise/fatigue. Negative for fever and chills.  HENT: Negative for hearing loss.   Eyes: Negative for blurred vision and double vision.  Respiratory: Negative for cough, shortness of breath and wheezing.   Cardiovascular: Negative for chest pain, palpitations and leg swelling.  Gastrointestinal: Negative for nausea, vomiting and abdominal pain.  Genitourinary: Negative for dysuria and frequency.  Musculoskeletal: Negative for myalgias and falls.  Skin: Negative for rash.  Neurological: Negative for dizziness, tremors, focal weakness and headaches.    DRUG ALLERGIES:   Allergies  Allergen Reactions  . Levaquin [Levofloxacin] Other (See Comments)    confusion    VITALS:  Blood pressure 137/98, pulse 103, temperature 99.3 F (37.4 C), temperature source Oral, resp. rate 18, height '5\' 9"'$  (1.753 m), weight 67.818 kg (149 lb 8.2 oz), SpO2 99 %.  PHYSICAL EXAMINATION:  Physical Exam  GENERAL:  60 y.o.-year-old patient lying in the bed with no acute distress.  EYES: Pupils equal, round, reactive to light and accommodation. No scleral icterus. Extraocular muscles intact.  HEENT: Head atraumatic, normocephalic. Oropharynx and nasopharynx clear.  NECK:  Supple, no jugular venous distention. No thyroid enlargement, no tenderness.  LUNGS: Normal breath sounds bilaterally,  no wheezing, rales,rhonchi or crepitation. No use of accessory muscles of respiration. Decreased right upper lobe breath sounds. CARDIOVASCULAR: S1, S2 normal. No murmurs, rubs, or gallops.  ABDOMEN: Soft, nontender, nondistended. Bowel sounds present. No organomegaly or mass.  EXTREMITIES: No pedal edema, cyanosis, or clubbing.  NEUROLOGIC: More alert and communicating, asking appropriate questions today Following commands, motor strength 5/5 and no sensory deficits  PSYCHIATRIC: The patient is more alert and oriented x 3, appears depressed, no suicidal ideation  SKIN: No obvious rash, lesion, or ulcer.    LABORATORY PANEL:   CBC  Recent Labs Lab 04/21/16 0509  WBC 23.3*  HGB 10.8*  HCT 32.4*  PLT 365   ------------------------------------------------------------------------------------------------------------------  Chemistries   Recent Labs Lab 04/14/16 1448  04/21/16 0509  NA 130*  < > 137  K 2.6*  < > 3.0*  CL 88*  < > 102  CO2 29  < > 25  GLUCOSE 117*  < > 98  BUN 11  < > 8  CREATININE 0.69  < > 0.63  CALCIUM 8.2*  < > 7.7*  MG 1.6*  < > 1.5*  AST 82*  --   --   ALT 36  --   --   ALKPHOS 91  --   --   BILITOT 1.0  --   --   < > = values in this interval not displayed. ------------------------------------------------------------------------------------------------------------------  Cardiac Enzymes No results for input(s): TROPONINI in the last 168 hours. ------------------------------------------------------------------------------------------------------------------  RADIOLOGY:  No results found.  EKG:   Orders placed or performed during the hospital encounter of 04/14/16  .  EKG 12-Lead  . EKG 12-Lead    ASSESSMENT AND PLAN:   60 year old male with history of COPD, Non-small cell lung cancer with extensive metastatic disease diagnosed at the end of May 2017, essential hypertension presented to the hospital due to intractable nausea and vomiting and  also right-sided chest pain from his malignancy.  1.Acute confusion/ AMS- toxic metabolic encephalopathy from pain patch - decreased fentanyl patch dose-much improved mental status now - CT of head without any acute findings - recent MRI of the brain as outpatient was negative for any metastases. - monitor closely - will try to keep pain meds at minimum.  2. Post obstructive pneumonia- with high-grade fevers, blood cultures negative. -CT chest and abdomen on admission showing right upper lobe malignant disease and diffuse metastasis, -ABX changed to Unasyn, appreciate ID consult - Fevers could be from his underlying malignant disease as well. However WBC count is also significantly high- monitor -Became hypothermic after Motrin. Lactic acid is elevated - blood culture is negative. - will switch to oral Abx soon.  3. Right-sided chest pain and right upper quadrant abdominal pain-extensive metastatic disease from his lung cancer which is on the right side.  Also bone metastases and liver metastases present. -decreased fentanyl patch due to AMS. - radiation oncology consult to see if palliative radiation would help with the pain.  - planned radiation on Monday.  4. Non-small cell lung cancer-diagnosed at the end of May 2017. Extensive metastatic disease noted as well. MRI of the brain is negative. -Oncology consulted. Chemotherapy once more stable Palliative care consulted for code status discussion  5. Essential hypertension-continue metoprolol.  6. DVT prophylaxis-on Lovenox  7. hypokalemia   Replace orally.  8. Hypomagnesemia   Replace IV.  All the records are reviewed and case discussed with Care Management/Social Workerr. Management plans discussed with the patient, family and they are in agreement.  CODE STATUS: Full Code  TOTAL TIME TAKING CARE OF THIS PATIENT: 30 minutes.   POSSIBLE D/C IN 2-3 DAYS, DEPENDING ON CLINICAL CONDITION.   Vaughan Basta M.D on  04/21/2016 at 9:41 AM  Between 7am to 6pm - Pager - [phone]  After 6pm go to [email or web address] - password [email or web address] [facility]  Office  [phone]  CC: Primary care physician; Default, Provider, MD

## 2016-04-21 NOTE — Progress Notes (Signed)
Pt had increased pain at 3:20am in which he requested Norco.  He denied pain previously when asked.  He has been up to the bathroom several times during the night.

## 2016-04-22 ENCOUNTER — Inpatient Hospital Stay: Payer: Medicaid Other

## 2016-04-22 ENCOUNTER — Inpatient Hospital Stay
Admit: 2016-04-22 | Discharge: 2016-04-22 | Disposition: A | Discharge status: Home / Self Care | Payer: Self-pay | Attending: Radiation Oncology | Admitting: Radiation Oncology

## 2016-04-22 LAB — CBC
HEMATOCRIT: 31.2 % — AB (ref 40.0–52.0)
HEMOGLOBIN: 10.8 g/dL — AB (ref 13.0–18.0)
MCH: 28.4 pg (ref 26.0–34.0)
MCHC: 34.6 g/dL (ref 32.0–36.0)
MCV: 82 fL (ref 80.0–100.0)
Platelets: 347 10*3/uL (ref 150–440)
RBC: 3.8 MIL/uL — ABNORMAL LOW (ref 4.40–5.90)
RDW: 15.3 % — AB (ref 11.5–14.5)
WBC: 27.7 10*3/uL — ABNORMAL HIGH (ref 3.8–10.6)

## 2016-04-22 LAB — BASIC METABOLIC PANEL
ANION GAP: 8 (ref 5–15)
BUN: 9 mg/dL (ref 6–20)
CALCIUM: 7.6 mg/dL — AB (ref 8.9–10.3)
CO2: 27 mmol/L (ref 22–32)
Chloride: 102 mmol/L (ref 101–111)
Creatinine, Ser: 0.52 mg/dL — ABNORMAL LOW (ref 0.61–1.24)
GFR calc Af Amer: >60 mL/min (ref >60)
GFR calc non Af Amer: >60 mL/min (ref >60)
GLUCOSE: 96 mg/dL (ref 65–99)
POTASSIUM: 3.3 mmol/L — AB (ref 3.5–5.1)
Sodium: 137 mmol/L (ref 135–145)

## 2016-04-22 LAB — MAGNESIUM: Magnesium: 1.8 mg/dL (ref 1.7–2.4)

## 2016-04-22 MED ORDER — ENSURE ENLIVE PO LIQD
237.0000 mL | Freq: Three times a day (TID) | ORAL | Status: DC
  Administered 2016-04-22 – 2016-04-26 (×6): 237 mL via ORAL

## 2016-04-22 MED ORDER — METOPROLOL TARTRATE 50 MG PO TABS
50.0000 mg | ORAL_TABLET | Freq: Two times a day (BID) | ORAL | Status: DC
  Administered 2016-04-22 – 2016-04-26 (×9): 50 mg via ORAL
  Filled 2016-04-22 (×9): qty 1

## 2016-04-22 MED ORDER — RIVAROXABAN 15 MG PO TABS
15.0000 mg | ORAL_TABLET | Freq: Two times a day (BID) | ORAL | Status: DC
  Administered 2016-04-22 – 2016-04-26 (×8): 15 mg via ORAL
  Filled 2016-04-22 (×9): qty 1

## 2016-04-22 MED ORDER — RIVAROXABAN 20 MG PO TABS: 20.0000 mg | ORAL_TABLET | Freq: Every day | ORAL | Status: DC

## 2016-04-22 MED ORDER — HYDROMORPHONE HCL 1 MG/ML IJ SOLN
1.0000 mg | INTRAMUSCULAR | Status: DC | PRN
  Administered 2016-04-22 – 2016-04-26 (×10): 1 mg via INTRAVENOUS
  Filled 2016-04-22 (×10): qty 1

## 2016-04-22 MED ORDER — AMOXICILLIN-POT CLAVULANATE 875-125 MG PO TABS
1.0000 | ORAL_TABLET | Freq: Two times a day (BID) | ORAL | Status: DC
  Administered 2016-04-22 – 2016-04-25 (×5): 1 via ORAL
  Filled 2016-04-22 (×6): qty 1

## 2016-04-22 NOTE — Progress Notes (Signed)
Dr. Anselm Jungling made aware of pt's increased swelling and pain in RUE. Also made aware of pt's blood pressure.  New orders received.  Clarise Cruz, RN

## 2016-04-22 NOTE — Plan of Care (Signed)
Problem: Fluid Volume: Goal: Ability to maintain a balanced intake and output will improve Outcome: Not Met (add Reason) Poor oral intake  Problem: Nutrition: Goal: Adequate nutrition will be maintained Outcome: Not Met (add Reason) Poor oral intake  Problem: Respiratory: Goal: Respiratory status will improve Outcome: Not Met (add Reason) Pt desat in 80's. Required O2 at 2 L this morning. Goal: Ability to maintain a clear airway will improve Outcome: Progressing Clears airway with strong cough Goal: Pain level will decrease Outcome: Not Progressing MS dc'd and Dilaudid ordered for uncontrolled pain.

## 2016-04-22 NOTE — Progress Notes (Signed)
Pt crying in pain.  Too soon for Norco or MS.  Called Dr Claria Dice and order received for Dilaudid 1 mg every 4 hours.  MS discontinued.  Reported elevated bp and temp to doctor. Dorna Bloom RN

## 2016-04-22 NOTE — Progress Notes (Signed)
Pt transported to cancer center for radiation.

## 2016-04-22 NOTE — Progress Notes (Signed)
Dr. Anselm Jungling made aware of blood clot in right subclavian vein.  New orders to be received.  Clarise Cruz, RN

## 2016-04-22 NOTE — Plan of Care (Signed)
Problem: Education: Goal: Knowledge of [facility] Education information/materials will improve Outcome: Not Met (add Reason) Confused  Problem: Pain Managment: Goal: General experience of comfort will improve Outcome: Not Progressing Called for change in pain medication x2  Problem: Fluid Volume: Goal: Ability to maintain a balanced intake and output will improve Outcome: Not Progressing Pt did not eat dinner, taking water only.  Problem: Nutrition: Goal: Adequate nutrition will be maintained Outcome: Not Met (add Reason) Pt did not eat dinner, declines

## 2016-04-22 NOTE — Care Management (Signed)
Patient was not in room when I rounded. Application to [facility] left at bedside with my contact information. RNCM will follow up with patient. He has not health insurance listed. He has been referred to [facility] and [facility] in the past but has not used these services.

## 2016-04-22 NOTE — Progress Notes (Signed)
PT Cancellation Note  Patient Details Name: Dennis Perkins MRN: [id number] DOB: May 18, 1956   Cancelled Treatment:    Reason Eval/Treat Not Completed: Medical issues which prohibited therapy. Chart reviewed and RN consulted. 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 Per RN, MD plans to anticoagulate but no order received yet. Pt will be out for radiation at 1400. Per protocol will hold PT evaluation until at least 5 hours after the initiation of therapeutic anticoagulation for thrombus. Will attempt evaluation when appropriate.  Lyndel Safe Tarren Velardi PT, DPT   Avarose Mervine 04/22/2016, 12:17 PM

## 2016-04-22 NOTE — Progress Notes (Signed)
Pt has had increased pain this shift. PRN meds given with relief.  Pt is now experiencing some hallucinations and anxiety.  Pt also experiencing some irregular breathing at times. PRN O2 placed on pt.  Dr. Marthann Schiller paged.

## 2016-04-22 NOTE — Consult Note (Signed)
Pharmacy Antibiotic Note  Dennis Perkins is a 60 y.o. male admitted on 04/14/2016 with post obstructive PNA from lung mets.  Pharmacy has been consulted for Unasyn dosing.  Plan: Continue unasyn 3g q 6 hours  Height: '5\' 9"'$  (175.3 cm) Weight: 151 lb 9.6 oz (68.765 kg) IBW/kg (Calculated) : 70.7  Temp (24hrs), Avg:99.2 F (37.3 C), Min:98.2 F (36.8 C), Max:100.2 F (37.9 C)   Recent Labs Lab 04/17/16 0531 04/18/16 0508 04/19/16 0442 04/19/16 0809 04/19/16 1027 04/20/16 0555 04/21/16 0509 04/22/16 0444  WBC 19.6* 18.1* 18.7*  --   --  22.6* 23.3* 27.7*  CREATININE 0.49*  --  0.61  --   --  0.58* 0.63 0.52*  LATICACIDVEN  --   --   --  2.1* 2.9*  --   --   --     Estimated Creatinine Clearance: 96.8 mL/min (by C-G formula based on Cr of 0.52).    Allergies  Allergen Reactions  . Levaquin [Levofloxacin] Other (See Comments)    confusion    Antimicrobials this admission: Ceftriaxone 6/20>> 6/22 Azithromycin 6/20>>6/22 Unasyn 6/22 >>   Dose adjustments this admission:   Microbiology results: 6/20 BCx: ng 2 days   Thank you for allowing pharmacy to be a part of this patient's care.  Chinita Greenland PharmD Clinical Pharmacist 04/22/2016   04/22/2016 3:02 PM

## 2016-04-22 NOTE — Progress Notes (Signed)
[place] [facility] [place] NAME: Dennis Perkins    MR#:  [id number]  DATE OF BIRTH:  1956/03/26  SUBJECTIVE:  CHIEF COMPLAINT:   Chief Complaint  Patient presents with  . Emesis  . Diarrhea  . Nausea   - Continues to spike fever until 04/18/16 night, likely malignancy related fevers.  Now no more fever in last 24 hrs. - The patient is more alert after decreasing his fentanyl dose - able to walk in room without trouble, very weak overall. - Asking to have a letter, that he does not have brain mets. - had c/o RU extremity swelling, checked US venous- and there is thrombus.  REVIEW OF SYSTEMS:  Review of Systems  Constitutional: Positive for malaise/fatigue. Negative for fever and chills.  HENT: Negative for hearing loss.   Eyes: Negative for blurred vision and double vision.  Respiratory: Negative for cough, shortness of breath and wheezing.   Cardiovascular: Negative for chest pain, palpitations and leg swelling.  Gastrointestinal: Negative for nausea, vomiting and abdominal pain.  Genitourinary: Negative for dysuria and frequency.  Musculoskeletal: Negative for myalgias and falls.  Skin: Negative for rash.  Neurological: Negative for dizziness, tremors, focal weakness and headaches.    DRUG ALLERGIES:   Allergies  Allergen Reactions  . Levaquin [Levofloxacin] Other (See Comments)    confusion    VITALS:  Blood pressure 144/88, pulse 99, temperature 99.3 F (37.4 C), temperature source Oral, resp. rate 18, height '5\' 9"'$  (1.753 m), weight 68.765 kg (151 lb 9.6 oz), SpO2 92 %.  PHYSICAL EXAMINATION:  Physical Exam  GENERAL:  60 y.o.-year-old patient lying in the bed with no acute distress.  EYES: Pupils equal, round, reactive to light and accommodation. No scleral icterus. Extraocular muscles intact.  HEENT: Head atraumatic, normocephalic. Oropharynx and nasopharynx clear.  NECK:  Supple, no jugular venous distention. No  thyroid enlargement, no tenderness.  LUNGS: Normal breath sounds bilaterally, no wheezing, rales,rhonchi or crepitation. No use of accessory muscles of respiration. Decreased right upper lobe breath sounds. CARDIOVASCULAR: S1, S2 normal. No murmurs, rubs, or gallops.  ABDOMEN: Soft, nontender, nondistended. Bowel sounds present. No organomegaly or mass.  EXTREMITIES: No pedal edema, cyanosis, or clubbing. RUL swelling. NEUROLOGIC: More alert and communicating, asking appropriate questions today Following commands, motor strength 5/5 and no sensory deficits  PSYCHIATRIC: The patient is more alert and oriented x 3, appears depressed, no suicidal ideation  SKIN: No obvious rash, lesion, or ulcer.    LABORATORY PANEL:   CBC  Recent Labs Lab 04/22/16 0444  WBC 27.7*  HGB 10.8*  HCT 31.2*  PLT 347   ------------------------------------------------------------------------------------------------------------------  Chemistries   Recent Labs Lab 04/22/16 0444  NA 137  K 3.3*  CL 102  CO2 27  GLUCOSE 96  BUN 9  CREATININE 0.52*  CALCIUM 7.6*  MG 1.8   ------------------------------------------------------------------------------------------------------------------  Cardiac Enzymes No results for input(s): TROPONINI in the last 168 hours. ------------------------------------------------------------------------------------------------------------------  RADIOLOGY:  US Venous Img Upper Uni Right  04/22/2016  CLINICAL DATA:  Right upper extremity pain and swelling for the past 3 days. History of malignancy. Evaluate for DVT. EXAM: RIGHT UPPER EXTREMITY VENOUS DOPPLER ULTRASOUND TECHNIQUE: Gray-scale sonography with graded compression, as well as color Doppler and duplex ultrasound were performed to evaluate the upper extremity deep venous system from the level of the subclavian vein and including the jugular, axillary, basilic, radial, ulnar and upper cephalic vein. Spectral Doppler  was utilized to evaluate flow at  rest and with distal augmentation maneuvers. COMPARISON:  Chest radiograph - 04/14/2016; chest CT - 04/14/2016 FINDINGS: Contralateral Subclavian Vein: Respiratory phasicity is normal and symmetric with the symptomatic side. No evidence of thrombus. Normal compressibility. Internal Jugular Vein: No evidence of thrombus. Normal compressibility, respiratory phasicity and response to augmentation. Subclavian Vein: There is occlusive mixed echogenic thrombus within the right subclavian vein, likely secondary to the known invasive right apical pulmonary mass demonstrated on chest CT performed 04/14/2016. Axillary Vein: No evidence of thrombus. Normal compressibility, respiratory phasicity and response to augmentation. Cephalic Vein: No evidence of thrombus. Normal compressibility, respiratory phasicity and response to augmentation. Basilic Vein: No evidence of thrombus. Normal compressibility, respiratory phasicity and response to augmentation. Brachial Veins: No evidence of thrombus. Normal compressibility, respiratory phasicity and response to augmentation. Radial Veins: No evidence of thrombus. Normal compressibility, respiratory phasicity and response to augmentation. Ulnar Veins: No evidence of thrombus. Normal compressibility, respiratory phasicity and response to augmentation. Venous Reflux:  None visualized. Other Findings:  None visualized. IMPRESSION: 1. Examination is positive for occlusive thrombosis within the imaged portions of the right subclavian vein likely secondary to the known invasive right apical pulmonary mass as demonstrated on chest CT performed 04/14/2016. Note, malignant thrombus / direct invasion is not excluded on the basis this examination. 2. No other evidence of DVT within the right upper extremity. Electronically Signed   By: Sandi Mariscal M.D.   On: 04/22/2016 10:34    EKG:   Orders placed or performed during the hospital encounter of 04/14/16  . EKG  12-Lead  . EKG 12-Lead    ASSESSMENT AND PLAN:   60 year old male with history of COPD, Non-small cell lung cancer with extensive metastatic disease diagnosed at the end of May 2017, essential hypertension presented to the hospital due to intractable nausea and vomiting and also right-sided chest pain from his malignancy.  1.Acute confusion/ AMS- toxic metabolic encephalopathy from pain patch - decreased fentanyl patch dose-much improved mental status now - CT of head without any acute findings - recent MRI of the brain as outpatient was negative for any metastases. - monitor closely - will try to keep pain meds at minimum.  2. Post obstructive pneumonia- with high-grade fevers, blood cultures negative. -CT chest and abdomen on admission showing right upper lobe malignant disease and diffuse metastasis, -ABX changed to Unasyn, appreciate ID consult - Fevers could be from his underlying malignant disease as well. However WBC count is also significantly high- monitor -Became hypothermic after Motrin. Lactic acid is elevated - blood culture is negative. - switch to oral augmentin.  3. Right-sided chest pain and right upper quadrant abdominal pain-extensive metastatic disease from his lung cancer which is on the right side.  Also bone metastases and liver metastases present. -decreased fentanyl patch due to AMS. - radiation oncology starting palliative radiation.  4. Non-small cell lung cancer-diagnosed at the end of May 2017. Extensive metastatic disease noted as well. MRI of the brain is negative. -Oncology consulted. Chemotherapy once more stable Palliative care consulted for code status discussion  5. Essential hypertension-continue metoprolol.  6. DVT   He had RUL swelling- checked US venous- Subclavian vein thrombus.   Spoke to Dr. Grayland Ormond and started on Xarelto.  7. hypokalemia   Replace orally.  8. Hypomagnesemia   Replace IV.  All the records are reviewed and case  discussed with Care Management/Social Workerr. Management plans discussed with the patient, family and they are in agreement.  CODE STATUS: Full Code  TOTAL  TIME TAKING CARE OF THIS PATIENT: 30 minutes.   POSSIBLE D/C IN 2-3 DAYS, DEPENDING ON CLINICAL CONDITION.   Vaughan Basta M.D on 04/22/2016 at 3:45 PM  Between 7am to 6pm - Pager - [phone]  After 6pm go to [email or web address] - password [email or web address] [email or web address]  Office  [phone]  CC: Primary care physician; Default, Provider, MD

## 2016-04-22 NOTE — Progress Notes (Signed)
[place]  [phone] [phone]  ID: Dennis Perkins OB: April 11, 1956  MR#: [id number]  OQH#:[id number]  Patient Care Team: Provider Default, MD as PCP - General  CHIEF COMPLAINT:  Chief Complaint  Patient presents with  . Emesis  . Diarrhea  . Nausea    INTERVAL HISTORY: Patient states his pain is better controlled with his current regimen. Nursing continues to feel he has intermittent confusion other family members state he is near his baseline. He continues to have intermittent fevers. He has new right arm swelling. Patient still has weakness and fatigue, but states this is improved. He offers no further complaints.  REVIEW OF SYSTEMS:   Review of Systems  Constitutional: Positive for fever and malaise/fatigue. Negative for weight loss.  Respiratory: Positive for cough. Negative for hemoptysis and shortness of breath.   Cardiovascular: Positive for chest pain.  Gastrointestinal: Negative.   Genitourinary: Negative.   Musculoskeletal: Negative.   Neurological: Positive for weakness.    As per HPI. Otherwise, a complete review of systems is negatve.  PAST MEDICAL HISTORY: Past Medical History  Diagnosis Date  . Lung cancer ([place])   . COPD (chronic obstructive pulmonary disease) ([place])   . Essential hypertension     PAST SURGICAL HISTORY: Past Surgical History  Procedure Laterality Date  . I&d extremity Right 03/17/2013    Procedure: IRRIGATION AND DEBRIDEMENT EXTREMITY, flexor tendon repair;  Surgeon: Linna Hoff, MD;  Location: [place];  Service: Orthopedics;  Laterality: Right;  . Knee surgery Bilateral     FAMILY HISTORY Family History  Problem Relation Age of Onset  . Breast cancer Mother   . Lung cancer Father        ADVANCED DIRECTIVES:    HEALTH MAINTENANCE: Social History  Substance Use Topics  . Smoking status: Current Some Day Smoker -- 0.50 packs/day for 40 years    Types: Cigarettes  . Smokeless  tobacco: Never Used  . Alcohol Use: 16.8 oz/week    28 Cans of beer per week     Colonoscopy:  PAP:  Bone density:  Lipid panel:  Allergies  Allergen Reactions  . Levaquin [Levofloxacin] Other (See Comments)    confusion    Current Facility-Administered Medications  Medication Dose Route Frequency Provider Last Rate Last Dose  . acetaminophen (TYLENOL) tablet 650 mg  650 mg Oral Q6H PRN Henreitta Leber, MD   650 mg at 04/22/16 0419   Or  . acetaminophen (TYLENOL) suppository 650 mg  650 mg Rectal Q6H PRN Henreitta Leber, MD      . amoxicillin-clavulanate (AUGMENTIN) 875-125 MG per tablet 1 tablet  1 tablet Oral Q12H Vaughan Basta, MD   1 tablet at 04/22/16 2044  . famotidine (PEPCID) tablet 20 mg  20 mg Oral BID Henreitta Leber, MD   20 mg at 04/22/16 2043  . feeding supplement (ENSURE ENLIVE) (ENSURE ENLIVE) liquid 237 mL  237 mL Oral TID BM Vaughan Basta, MD   237 mL at 04/22/16 2044  . HYDROcodone-acetaminophen (NORCO) 10-325 MG per tablet 1 tablet  1 tablet Oral Q6H PRN Vaughan Basta, MD   1 tablet at 04/22/16 2043  . HYDROmorphone (DILAUDID) injection 1 mg  1 mg Intravenous Q4H PRN Debby Crosley, MD   1 mg at 04/22/16 1743  . metoprolol (LOPRESSOR) tablet 50 mg  50 mg Oral BID Vaughan Basta, MD   50 mg at 04/22/16 2209  . nitroGLYCERIN (NITROSTAT) SL tablet 0.4 mg  0.4 mg Sublingual  Q5 min PRN Max Sane, MD   0.4 mg at 04/14/16 2348  . ondansetron (ZOFRAN) tablet 4 mg  4 mg Oral Q6H PRN Henreitta Leber, MD       Or  . ondansetron [facility]) injection 4 mg  4 mg Intravenous Q6H PRN Henreitta Leber, MD   4 mg at 04/14/16 2141  . polyethylene glycol (MIRALAX / GLYCOLAX) packet 17 g  17 g Oral Daily PRN Henreitta Leber, MD   17 g at 04/16/16 0931  . potassium chloride SA (K-DUR,KLOR-CON) CR tablet 40 mEq  40 mEq Oral BID Vaughan Basta, MD   40 mEq at 04/22/16 2043  . Rivaroxaban (XARELTO) tablet 15 mg  15 mg Oral BID WC Vaughan Basta,  MD   15 mg at 04/22/16 1743  . [START ON 05/13/2016] rivaroxaban (XARELTO) tablet 20 mg  20 mg Oral Q supper Vaughan Basta, MD      . senna-docusate (Senokot-S) tablet 2 tablet  2 tablet Oral QHS Gladstone Lighter, MD   2 tablet at 04/22/16 2043    OBJECTIVE: Filed Vitals:   04/22/16 1449 04/22/16 2050  BP: 144/88 149/99  Pulse: 99 106  Temp: 99.3 F (37.4 C) 100 F (37.8 C)  Resp: 18 18     Body mass index is 22.38 kg/(m^2).    ECOG FS:0 - Asymptomatic  General: Well-developed, well-nourished, no acute distress. Eyes: Pink conjunctiva, anicteric sclera. Lungs: Clear to auscultation bilaterally. Heart: Regular rate and rhythm. No rubs, murmurs, or gallops. Abdomen: Soft, nontender, nondistended. No organomegaly noted, normoactive bowel sounds. Musculoskeletal: Right upper extremity swelling. Neuro: Alert, answering all questions appropriately. Cranial nerves grossly intact. Skin: No rashes or petechiae noted. Psych: Normal affect.   LAB RESULTS:  Lab Results  Component Value Date   NA 137 04/22/2016   K 3.3* 04/22/2016   CL 102 04/22/2016   CO2 27 04/22/2016   GLUCOSE 96 04/22/2016   BUN 9 04/22/2016   CREATININE 0.52* 04/22/2016   CALCIUM 7.6* 04/22/2016   PROT 7.3 04/14/2016   ALBUMIN 2.2* 04/14/2016   AST 82* 04/14/2016   ALT 36 04/14/2016   ALKPHOS 91 04/14/2016   BILITOT 1.0 04/14/2016   GFRNONAA >60 04/22/2016   GFRAA >60 04/22/2016    Lab Results  Component Value Date   WBC 27.7* 04/22/2016   NEUTROABS 7.9* 03/23/2016   HGB 10.8* 04/22/2016   HCT 31.2* 04/22/2016   MCV 82.0 04/22/2016   PLT 347 04/22/2016     STUDIES: Dg Chest 1 View  03/26/2016  CLINICAL DATA:  60 year old male with a history of biopsy pleural lesion EXAM: CHEST 1 VIEW COMPARISON:  Chest CT 03/23/2016, chest x-ray 03/23/2016 FINDINGS: Cardiomediastinal silhouette unchanged in size and contour. Pleural parenchymal thickening on the right, predominantly apical. Fullness in  the right hilar region. No pneumothorax or pleural effusion. Left lung relatively well aerated. No displaced fracture IMPRESSION: Status post right-sided pleural mass biopsy with no complicating features. Signed, Dulcy Fanny. Earleen Newport, DO [facility] Electronically Signed   By: Corrie Mckusick D.O.   On: 03/26/2016 15:55   Dg Chest 2 View  04/14/2016  CLINICAL DATA:  Patient with right upper chest pain. Recent diagnosis of lung cancer. EXAM: CHEST  2 VIEW COMPARISON:  Chest radiograph 03/26/2016 ; chest CTA 03/23/2016. FINDINGS: Monitoring leads overlie the patient. Stable cardiac and mediastinal contours. Re- demonstrated pleural thickening and irregular consolidative opacities within the right upper lung. No pleural effusion or pneumothorax. Thoracic  spine degenerative changes. IMPRESSION: Grossly unchanged patchy consolidation within the right upper lung with associated marked pleural thickening. Electronically Signed   By: Lovey Newcomer M.D.   On: 04/14/2016 15:30   Ct Head Wo Contrast  04/17/2016  CLINICAL DATA:  Confusion, history of lung cancer EXAM: CT HEAD WITHOUT CONTRAST TECHNIQUE: Contiguous axial images were obtained from the base of the skull through the vertex without intravenous contrast. COMPARISON:  Brain MRI 03/24/2016 FINDINGS: No skull fracture is noted. Paranasal sinuses shows mild mucosal thickening left maxillary sinus. No intracranial hemorrhage, mass effect or midline shift. No acute cortical infarction. No mass lesion is noted on this unenhanced scan. No intraventricular hemorrhage. No vascular calcifications are noted. IMPRESSION: No acute intracranial abnormality. There is mucosal thickening left maxillary sinus. No definite acute cortical infarction. Electronically Signed   By: Lahoma Crocker M.D.   On: 04/17/2016 16:43   Ct Chest W Contrast  04/14/2016  CLINICAL DATA:  History of lung cancer. EXAM: CT CHEST, ABDOMEN, AND PELVIS WITH  CONTRAST TECHNIQUE: Multidetector CT imaging of the chest, abdomen and pelvis was performed following the standard protocol during bolus administration of intravenous contrast. CONTRAST:  15m ISOVUE-300 IOPAMIDOL (ISOVUE-300) INJECTION 61% COMPARISON:  Chest radiograph 04/14/2016, chest abdomen pelvis CT 03/23/2016 FINDINGS: CT CHEST FINDINGS Mediastinum/Lymph Nodes: There is a right-sided mediastinal lymphadenopathy with necrotic appearance. Precarinal abnormal appearing lymph node measures 2.9 cm in short axis. There is also similar in appearance right hilar lymphadenopathy. The heart is normal in size. There is no significant pericardial effusion. Mild calcific atherosclerotic disease of the coronary arteries noted. Small nodules are seen along the medial right upper and middle lobe visceral pleura, likely representing seeding with metastatic disease. No evidence of central pulmonary embolus. Lungs/Pleura: There is a nodular soft tissue thickening of the upper lobe pleura, consistent with neoplastic involvement. Satellite nodules are seen along the medial fissural pleura of the right middle lobe. There is a 7 mm soft tissue nodule in the medial portion of the right upper lobe. More linear pleural thickening is seen within the posterior right lower lobe pleura. There is a small right pleural effusion. The left lung is clear other than mild dependent basilar atelectasis. No evidence of pneumothorax. Musculoskeletal: Metastatic deposit with large soft tissue component is seen associated with the T11 vertebral body causing osteal lysis of the small portion of the right hand side vertebral body, right pedicle and right transverse process. Similar in appearance partially visualized due to collimation metastatic deposit is seen involving the posterior process of T1 vertebral body CT ABDOMEN PELVIS FINDINGS Hepatobiliary: There are 3 ill-defined liver masses likely representing metastatic disease, which are too vague  is to be measured accurately. Periportal edema. There is a 2.7 by 4.2 cm soft tissue mass within porta hepaticus, likely representing enlarged lymph node. Other smaller adjacent lymph nodes are seen. Borderline enlarged but rounded lymph nodes are noted within the gastrohepatic ligament, probably metastatic. Pancreas: No mass, inflammatory changes, or other significant abnormality. Spleen: Within normal limits in size and appearance. Adrenals/Urinary Tract: There is a right adrenal soft tissue mass measuring 3.3 by 2.8 cm, probably metastatic. The left adrenal is normal. There is a left superior water density measuring circumscribed lesion, representing a cyst by CT criteria. Another less defined hypoattenuated lesion is seen in the medial upper cortex of the left kidney. Stomach/Bowel: No evidence of obstruction, inflammatory process, or abnormal fluid collections. Vascular Normal appearance of the abdominal aorta. Reproductive: No mass or other significant abnormality.  Other: None. Musculoskeletal: No suspicious bone lesions identified within the abdomen or pelvis. IMPRESSION: Necrotic appearing right-sided anterior mediastinal and right hilar lymphadenopathy. Extensive nodular soft tissue thickening of the right upper lobe pleura, highly suggestive for metastatic disease with dropped nodules along the medial pleura of the right middle lobe. The previously demonstrated right upper lobe pulmonary based mass has coalesced with the metastatic pleural thickening. Small right upper lobe 7 mm soft tissue nodule, likely neoplastic. Osseous metastatic disease with significant soft tissue component involving the right hand side of T11 vertebral body, and the posterior process of T1 vertebral body. Three large vaguely defined liver masses, which likely represent metastatic disease, difficult to measure due to extremely indistinct borders. Right adrenal mass, porta hepatis and mesenteric lymphadenopathy, highly suggestive of  metastatic disease, and increased in size. Indeterminate left renal lesion, which may represent a cyst. Periportal edema within the liver. Electronically Signed   By: Fidela Salisbury M.D.   On: 04/14/2016 18:13   Mr Jeri Cos YS Contrast  03/24/2016  ADDENDUM REPORT: 03/24/2016 13:03 ADDENDUM: Tiny region of blood breakdown products left parietal lobe may reflect result of prior hemorrhagic ischemia. Electronically Signed   By: Genia Del M.D.   On: 03/24/2016 13:03  03/24/2016  CLINICAL DATA:  60 year old male with lung cancer. Staging. Initial encounter. EXAM: MRI HEAD WITHOUT AND WITH CONTRAST TECHNIQUE: Multiplanar, multiecho pulse sequences of the brain and surrounding structures were obtained without and with intravenous contrast. CONTRAST:  80m MULTIHANCE GADOBENATE DIMEGLUMINE 529 MG/ML IV SOLN COMPARISON:  02/05/2015 head CT. FINDINGS: Exam is motion degraded. No discrete parenchymal enhancing lesion however, on diffusion sequence punctate areas of restricted motion medial right parietal lobe, left frontal lobe and the right cerebellum raises possibility of small nonenhancing intracranial metastatic lesions. Heterogeneous bone marrow in a diffuse fashion without focal osseous destructive lesion. No acute infarct or intracranial hemorrhage. Mild chronic small vessel disease changes. Global atrophy without hydrocephalus. Major intracranial vascular structures are patent. Complete opacification left maxillary sinus with small size suggests and result of chronic obstruction. Diminutive size pituitary gland. No worrisome orbital abnormality noted. IMPRESSION: Exam is motion degraded. No discrete parenchymal enhancing lesion however, on diffusion sequence punctate areas of restricted motion medial right parietal lobe, left frontal lobe and the right cerebellum raises possibility of small nonenhancing intracranial metastatic lesions. Heterogeneous bone marrow in a diffuse fashion without focal osseous  destructive lesion. Mild chronic small vessel disease changes. Global atrophy without hydrocephalus. Complete opacification left maxillary sinus with small size suggests and result of chronic obstruction. Electronically Signed: By: SGenia DelM.D. On: 03/24/2016 12:50   Ct Abdomen Pelvis W Contrast  04/14/2016  CLINICAL DATA:  History of lung cancer. EXAM: CT CHEST, ABDOMEN, AND PELVIS WITH CONTRAST TECHNIQUE: Multidetector CT imaging of the chest, abdomen and pelvis was performed following the standard protocol during bolus administration of intravenous contrast. CONTRAST:  1051mISOVUE-300 IOPAMIDOL (ISOVUE-300) INJECTION 61% COMPARISON:  Chest radiograph 04/14/2016, chest abdomen pelvis CT 03/23/2016 FINDINGS: CT CHEST FINDINGS Mediastinum/Lymph Nodes: There is a right-sided mediastinal lymphadenopathy with necrotic appearance. Precarinal abnormal appearing lymph node measures 2.9 cm in short axis. There is also similar in appearance right hilar lymphadenopathy. The heart is normal in size. There is no significant pericardial effusion. Mild calcific atherosclerotic disease of the coronary arteries noted. Small nodules are seen along the medial right upper and middle lobe visceral pleura, likely representing seeding with metastatic disease. No evidence of central pulmonary embolus. Lungs/Pleura: There is a nodular soft  tissue thickening of the upper lobe pleura, consistent with neoplastic involvement. Satellite nodules are seen along the medial fissural pleura of the right middle lobe. There is a 7 mm soft tissue nodule in the medial portion of the right upper lobe. More linear pleural thickening is seen within the posterior right lower lobe pleura. There is a small right pleural effusion. The left lung is clear other than mild dependent basilar atelectasis. No evidence of pneumothorax. Musculoskeletal: Metastatic deposit with large soft tissue component is seen associated with the T11 vertebral body causing  osteal lysis of the small portion of the right hand side vertebral body, right pedicle and right transverse process. Similar in appearance partially visualized due to collimation metastatic deposit is seen involving the posterior process of T1 vertebral body CT ABDOMEN PELVIS FINDINGS Hepatobiliary: There are 3 ill-defined liver masses likely representing metastatic disease, which are too vague is to be measured accurately. Periportal edema. There is a 2.7 by 4.2 cm soft tissue mass within porta hepaticus, likely representing enlarged lymph node. Other smaller adjacent lymph nodes are seen. Borderline enlarged but rounded lymph nodes are noted within the gastrohepatic ligament, probably metastatic. Pancreas: No mass, inflammatory changes, or other significant abnormality. Spleen: Within normal limits in size and appearance. Adrenals/Urinary Tract: There is a right adrenal soft tissue mass measuring 3.3 by 2.8 cm, probably metastatic. The left adrenal is normal. There is a left superior water density measuring circumscribed lesion, representing a cyst by CT criteria. Another less defined hypoattenuated lesion is seen in the medial upper cortex of the left kidney. Stomach/Bowel: No evidence of obstruction, inflammatory process, or abnormal fluid collections. Vascular Normal appearance of the abdominal aorta. Reproductive: No mass or other significant abnormality. Other: None. Musculoskeletal: No suspicious bone lesions identified within the abdomen or pelvis. IMPRESSION: Necrotic appearing right-sided anterior mediastinal and right hilar lymphadenopathy. Extensive nodular soft tissue thickening of the right upper lobe pleura, highly suggestive for metastatic disease with dropped nodules along the medial pleura of the right middle lobe. The previously demonstrated right upper lobe pulmonary based mass has coalesced with the metastatic pleural thickening. Small right upper lobe 7 mm soft tissue nodule, likely  neoplastic. Osseous metastatic disease with significant soft tissue component involving the right hand side of T11 vertebral body, and the posterior process of T1 vertebral body. Three large vaguely defined liver masses, which likely represent metastatic disease, difficult to measure due to extremely indistinct borders. Right adrenal mass, porta hepatis and mesenteric lymphadenopathy, highly suggestive of metastatic disease, and increased in size. Indeterminate left renal lesion, which may represent a cyst. Periportal edema within the liver. Electronically Signed   By: Fidela Salisbury M.D.   On: 04/14/2016 18:13   US Venous Img Lower Bilateral  04/18/2016  CLINICAL DATA:  Bilateral lower extremity swelling with fever for 2 days. EXAM: BILATERAL LOWER EXTREMITY VENOUS DOPPLER ULTRASOUND TECHNIQUE: Gray-scale sonography with graded compression, as well as color Doppler and duplex ultrasound were performed to evaluate the lower extremity deep venous systems from the level of the common femoral vein and including the common femoral, femoral, profunda femoral, popliteal and calf veins including the posterior tibial, peroneal and gastrocnemius veins when visible. The superficial great saphenous vein was also interrogated. Spectral Doppler was utilized to evaluate flow at rest and with distal augmentation maneuvers in the common femoral, femoral and popliteal veins. COMPARISON:  None. FINDINGS: RIGHT LOWER EXTREMITY Common Femoral Vein: No evidence of thrombus. Normal compressibility, respiratory phasicity and response to augmentation. Saphenofemoral Junction:  No evidence of thrombus. Normal compressibility and flow on color Doppler imaging. Profunda Femoral Vein: No evidence of thrombus. Normal compressibility and flow on color Doppler imaging. Femoral Vein: No evidence of thrombus. Normal compressibility, respiratory phasicity and response to augmentation. Popliteal Vein: No evidence of thrombus. Normal  compressibility, respiratory phasicity and response to augmentation. Calf Veins: No evidence of thrombus. Normal compressibility and flow on color Doppler imaging. Superficial Great Saphenous Vein: No evidence of thrombus. Normal compressibility and flow on color Doppler imaging. Venous Reflux:  None. Other Findings:  None. LEFT LOWER EXTREMITY Common Femoral Vein: No evidence of thrombus. Normal compressibility, respiratory phasicity and response to augmentation. Saphenofemoral Junction: No evidence of thrombus. Normal compressibility and flow on color Doppler imaging. Profunda Femoral Vein: No evidence of thrombus. Normal compressibility and flow on color Doppler imaging. Femoral Vein: No evidence of thrombus. Normal compressibility, respiratory phasicity and response to augmentation. Popliteal Vein: No evidence of thrombus. Normal compressibility, respiratory phasicity and response to augmentation. Calf Veins: No evidence of thrombus. Normal compressibility and flow on color Doppler imaging. Superficial Great Saphenous Vein: No evidence of thrombus. Normal compressibility and flow on color Doppler imaging. Venous Reflux:  None. Other Findings:  None. IMPRESSION: No evidence of deep venous thrombosis. Electronically Signed   By: Jerilynn Mages.  Shick M.D.   On: 04/18/2016 17:21   US Venous Img Upper Uni Right  04/22/2016  CLINICAL DATA:  Right upper extremity pain and swelling for the past 3 days. History of malignancy. Evaluate for DVT. EXAM: RIGHT UPPER EXTREMITY VENOUS DOPPLER ULTRASOUND TECHNIQUE: Gray-scale sonography with graded compression, as well as color Doppler and duplex ultrasound were performed to evaluate the upper extremity deep venous system from the level of the subclavian vein and including the jugular, axillary, basilic, radial, ulnar and upper cephalic vein. Spectral Doppler was utilized to evaluate flow at rest and with distal augmentation maneuvers. COMPARISON:  Chest radiograph - 04/14/2016; chest  CT - 04/14/2016 FINDINGS: Contralateral Subclavian Vein: Respiratory phasicity is normal and symmetric with the symptomatic side. No evidence of thrombus. Normal compressibility. Internal Jugular Vein: No evidence of thrombus. Normal compressibility, respiratory phasicity and response to augmentation. Subclavian Vein: There is occlusive mixed echogenic thrombus within the right subclavian vein, likely secondary to the known invasive right apical pulmonary mass demonstrated on chest CT performed 04/14/2016. Axillary Vein: No evidence of thrombus. Normal compressibility, respiratory phasicity and response to augmentation. Cephalic Vein: No evidence of thrombus. Normal compressibility, respiratory phasicity and response to augmentation. Basilic Vein: No evidence of thrombus. Normal compressibility, respiratory phasicity and response to augmentation. Brachial Veins: No evidence of thrombus. Normal compressibility, respiratory phasicity and response to augmentation. Radial Veins: No evidence of thrombus. Normal compressibility, respiratory phasicity and response to augmentation. Ulnar Veins: No evidence of thrombus. Normal compressibility, respiratory phasicity and response to augmentation. Venous Reflux:  None visualized. Other Findings:  None visualized. IMPRESSION: 1. Examination is positive for occlusive thrombosis within the imaged portions of the right subclavian vein likely secondary to the known invasive right apical pulmonary mass as demonstrated on chest CT performed 04/14/2016. Note, malignant thrombus / direct invasion is not excluded on the basis this examination. 2. No other evidence of DVT within the right upper extremity. Electronically Signed   By: Sandi Mariscal M.D.   On: 04/22/2016 10:34   Ct Biopsy  03/26/2016  INDICATION: 60 year old male with a history of right-sided pleural mass. He has been referred for biopsy. EXAM: CT BIOPSY MEDICATIONS: None. ANESTHESIA/SEDATION: Moderate (conscious) sedation  was employed  during this procedure. A total of Versed 2.0 mg and Fentanyl 75 mcg was administered intravenously. Moderate Sedation Time: 12 minutes. The patient's level of consciousness and vital signs were monitored continuously by radiology nursing throughout the procedure under my direct supervision. FLUOROSCOPY TIME:  CT COMPLICATIONS: None PROCEDURE: The procedure, risks, benefits, and alternatives were explained to the patient and the patient's family. Specific risks that were addressed included bleeding, infection, pneumothorax, need for further procedure including chest tube placement, chance of delayed pneumothorax or hemorrhage, hemoptysis, nondiagnostic sample, cardiopulmonary collapse, death. Questions regarding the procedure were encouraged and answered. The patient understands and consents to the procedure. Patient was positioned in the prone position on the CT gantry table and a scout CT of the chest was performed for planning purposes. Once angle of approach was determined, the skin and subcutaneous tissues this scan was prepped and draped in the usual sterile fashion, and a sterile drape was applied covering the operative field. A sterile gown and sterile gloves were used for the procedure. Local anesthesia was provided with 1% Lidocaine. The skin and subcutaneous tissues were infiltrated 1% lidocaine for local anesthesia, and a small stab incision was made with an 11 blade scalpel. Using CT guidance, a 17 gauge trocar needle was advanced into the right pleuraltarget. After confirmation of the tip, multiple separate 18 gauge core biopsies were performed. These were placed into solution for transportation to the lab. Final CT image was performed. Patient tolerated the procedure well and remained hemodynamically stable throughout. No complications were encountered and no significant blood loss was encountered. IMPRESSION: Status post CT-guided biopsy of right pleural mass. Tissue specimen sent to  pathology for complete histopathologic analysis. Signed, Dulcy Fanny. Earleen Newport, DO [facility] Radiology Electronically Signed   By: Corrie Mckusick D.O.   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  PLAN:    1. Lung cancer: Imaging and pathology results reviewed independently confirming stage IV disease. MRI of the brain reported as negative. Patient will benefit from palliative chemotherapy upon discharge. He also may benefit from palliative XRT for his significant pain. Appreciate radiation oncology input with plans of starting XRT today.  Patient will require a PET scan as well as port placement, but these can be accomplished as an outpatient.  2. Pain: Likely secondary to underlying malignancy: XRT as above. Given patient's intermittent confusion, his narcotic dose has been adjusted with improvement of his symptoms. 3. Intermittent fever: Postobstructive pneumonia. Continue current antibiotics. Appreciate ID input. There is also possibility of tumor fever, monitor. 4. Leukocytosis: Likely reactive, monitor.  5. Right upper extremity DVT: Likely secondary to underlying malignancy, continue anticoagulation as ordered. Continue current narcotic regimen. Patient will require transition to oral regimen prior to discharge. Follow-up in the [place] as above. 6. Nausea: Improving. Continue antiemetics as prescribed.  Will follow.  Lloyd Huger, MD   04/22/2016 11:56 PM

## 2016-04-22 NOTE — Progress Notes (Signed)
Nutrition Follow-up  DOCUMENTATION CODES:   Severe malnutrition in context of acute illness/injury  INTERVENTION:   -Cater to pt preferences on Regular diet order, will order fresh fruit cup per pt preference -Provide encouragement at all meal times. -Continue Ensure as ordered as per RN pt is drinking; will increase to TID -May need calorie count to better assess nutritional intake   NUTRITION DIAGNOSIS:   Malnutrition related to acute illness as evidenced by energy intake < or equal to 50% for > or equal to 5 days, percent weight loss, mild depletion of muscle mass, moderate depletions of muscle mass.  GOAL:   Patient will meet greater than or equal to 90% of their needs  MONITOR:   PO intake, Supplement acceptance, Labs, Weight trends, I & O's  REASON FOR ASSESSMENT:   Consult Poor PO  ASSESSMENT:   Pt admitted with intractible n/v. Pt with recent diagnosis of non-small cell lung cancer 2 weeks PTA per MD note.  Per MD note, pt with confusion possibly toxic metabolic encephalopathy; MRI for possible brain mets. Pt with lung cancer with mets to bone and liver per MD note. Pt s/p ultrasound this am.  Diet Order:  Diet regular Room service appropriate?: No; Fluid consistency:: Thin    Current Nutrition: Pt ate a few bites of egg omelete this am with 75% of fruit cup. RD overheard pt ordering a sandwich with french fries for lunch today. Pt reports drinking his Ensure but really liking his fruit cup. Pt confused at times during visit. RN Andee Poles had pt over the weekend and reports that pt was not eating well but did drink his Ensures well. RD offered ice cream, pt reports 'its too rich.'   Gastrointestinal Profile: Last BM: 04/20/2016   Medications: KCl, Senokot Labs: K 3.3, Ca 7.6, Cre 0.52   Weight Trend since Admission: Filed Weights   04/20/16 0500 04/21/16 0505 04/22/16 0422  Weight: 149 lb 9.6 oz (67.858 kg) 149 lb 8.2 oz (67.818 kg) 151 lb 9.6 oz (68.765  kg)     Skin:  Reviewed, no issues   BMI:  Body mass index is 22.38 kg/(m^2).  Estimated Nutritional Needs:   Kcal:  1980-2310kcals  Protein:  80-100g protein  Fluid:  >/= 2L fluid  EDUCATION NEEDS:   Education needs no appropriate at this time  Dwyane Luo, RD, LDN Pager [phone] Weekend/On-Call Pager [phone]

## 2016-04-23 DIAGNOSIS — I82621 Acute embolism and thrombosis of deep veins of right upper extremity: Secondary | ICD-10-CM

## 2016-04-23 DIAGNOSIS — R531 Weakness: Secondary | ICD-10-CM

## 2016-04-23 DIAGNOSIS — D72829 Elevated white blood cell count, unspecified: Secondary | ICD-10-CM

## 2016-04-23 DIAGNOSIS — R197 Diarrhea, unspecified: Secondary | ICD-10-CM

## 2016-04-23 DIAGNOSIS — J188 Other pneumonia, unspecified organism: Secondary | ICD-10-CM

## 2016-04-23 DIAGNOSIS — R079 Chest pain, unspecified: Secondary | ICD-10-CM

## 2016-04-23 LAB — BASIC METABOLIC PANEL
Anion gap: 11 (ref 5–15)
BUN: 10 mg/dL (ref 6–20)
CALCIUM: 7.8 mg/dL — AB (ref 8.9–10.3)
CO2: 26 mmol/L (ref 22–32)
CREATININE: 0.62 mg/dL (ref 0.61–1.24)
Chloride: 99 mmol/L — ABNORMAL LOW (ref 101–111)
GFR calc Af Amer: >60 mL/min (ref >60)
GLUCOSE: 89 mg/dL (ref 65–99)
Potassium: 3.7 mmol/L (ref 3.5–5.1)
SODIUM: 136 mmol/L (ref 135–145)

## 2016-04-23 LAB — URINALYSIS COMPLETE WITH MICROSCOPIC (ARMC ONLY)
BILIRUBIN URINE: NEGATIVE
Bacteria, UA: NONE SEEN
Glucose, UA: NEGATIVE mg/dL
Hgb urine dipstick: NEGATIVE
Leukocytes, UA: NEGATIVE
Nitrite: NEGATIVE
PH: 6 (ref 5.0–8.0)
Protein, ur: 30 mg/dL — AB
RBC / HPF: NONE SEEN RBC/hpf (ref 0–5)
Specific Gravity, Urine: 1.018 (ref 1.005–1.030)

## 2016-04-23 LAB — AMMONIA: Ammonia: <9 umol/L — ABNORMAL LOW (ref 9–35)

## 2016-04-23 MED ORDER — LORAZEPAM 2 MG/ML IJ SOLN
1.0000 mg | Freq: Four times a day (QID) | INTRAMUSCULAR | Status: DC | PRN
  Administered 2016-04-24: 14:00:00 1 mg via INTRAVENOUS
  Filled 2016-04-23 (×2): qty 1

## 2016-04-23 MED ORDER — LORAZEPAM 2 MG/ML IJ SOLN
1.0000 mg | Freq: Once | INTRAMUSCULAR | Status: AC
  Administered 2016-04-23: 10:00:00 1 mg via INTRAVENOUS
  Filled 2016-04-23: qty 1

## 2016-04-23 MED ORDER — LORAZEPAM 2 MG/ML IJ SOLN
1.0000 mg | Freq: Once | INTRAMUSCULAR | Status: AC
  Administered 2016-04-23: 1 mg via INTRAVENOUS
  Filled 2016-04-23: qty 1

## 2016-04-23 MED ORDER — HYDROCODONE-ACETAMINOPHEN 10-325 MG PO TABS
1.0000 | ORAL_TABLET | Freq: Four times a day (QID) | ORAL | Status: DC | PRN
  Administered 2016-04-23 – 2016-04-24 (×2): 1 via ORAL
  Filled 2016-04-23 (×2): qty 1

## 2016-04-23 MED ORDER — MORPHINE SULFATE ER 15 MG PO TBCR
15.0000 mg | EXTENDED_RELEASE_TABLET | Freq: Two times a day (BID) | ORAL | Status: DC
  Administered 2016-04-23 – 2016-04-26 (×7): 15 mg via ORAL
  Filled 2016-04-23 (×7): qty 1

## 2016-04-23 NOTE — Evaluation (Signed)
Physical Therapy Evaluation Patient Details Name: Dennis Perkins MRN: [id number] DOB: [birth year] Today's Date: 04/23/2016   History of Present Illness  Pt admitted for severe nausea/vomiting. Pt with history of COPD, HTN, stage 4 lung cancer with mets to body at bone/liver. Pt with pulmonary mass as well. During this admission pt with R subclavian vein DVT and was started on Xarelto started 1743 on 04/22/16.  Clinical Impression  Pt is a pleasant 60 year old male who was admitted for severe nausea/vomiting secondary to chemotherapy. Pt demonstrates all bed mobility/transfers/ambulation at baseline level. Pt very confused, is poor historian, however does report his sister can help him at home. Pt performed all mobility on room air with sats at 91%. Pt does not require any further PT needs at this time. Pt will be dc in house and does not require follow up. RN aware. Will dc current orders.      Follow Up Recommendations No PT follow up    Equipment Recommendations       Recommendations for Other Services       Precautions / Restrictions Precautions Precautions: None Restrictions Weight Bearing Restrictions: No      Mobility  Bed Mobility Overal bed mobility: Independent             General bed mobility comments: safe technique performed  Transfers Overall transfer level: Independent Equipment used: None             General transfer comment: safe technique performed, no AD required  Ambulation/Gait Ambulation/Gait assistance: Supervision Ambulation Distance (Feet): 200 Feet Assistive device: None Gait Pattern/deviations: Shuffle     General Gait Details: shuffling gait speed, however no LOB noted. Pt with rounded shoulders during ambulation, fatigues with increased distance. Slow gait speed  Stairs            Wheelchair Mobility    Modified Rankin (Stroke Patients Only)       Balance Overall balance assessment: Independent                                            Pertinent Vitals/Pain Pain Assessment: Faces Faces Pain Scale: Hurts even more Pain Location: "all over, all the time" Pain Descriptors / Indicators: Dull Pain Intervention(s): Limited activity within patient's tolerance    Home Living Family/patient expects to be discharged to:: Private residence Living Arrangements:  (lives with sister) Available Help at Discharge: Family Type of Home: House Home Access: Stairs to enter Entrance Stairs-Rails: Right Entrance Stairs-Number of Steps: 3 Home Layout: One level Home Equipment: None Additional Comments: pt is poor historian secondary to confusion, pt able to report he lives with sister, however all other home environment taken from previous evaluation    Prior Function Level of Independence: Independent               Hand Dominance        Extremity/Trunk Assessment   Upper Extremity Assessment: Overall WFL for tasks assessed           Lower Extremity Assessment: Overall WFL for tasks assessed         Communication   Communication: No difficulties  Cognition Arousal/Alertness: Awake/alert Behavior During Therapy: WFL for tasks assessed/performed Overall Cognitive Status: Impaired/Different from baseline                      General Comments  Exercises Other Exercises Other Exercises: Pt received in bathroom, safe technique performed with independence noted for all hygiene      Assessment/Plan    PT Assessment Patent does not need any further PT services  PT Diagnosis     PT Problem List    PT Treatment Interventions     PT Goals (Current goals can be found in the Care Plan section) Acute Rehab PT Goals Patient Stated Goal: no goals stated PT Goal Formulation: All assessment and education complete, DC therapy Time For Goal Achievement: April 28, 2016 Potential to Achieve Goals: Good    Frequency     Barriers to discharge        Co-evaluation                End of Session Equipment Utilized During Treatment: Gait belt Activity Tolerance: Patient tolerated treatment well;Patient limited by pain Patient left: in bed;with bed alarm set (pt refused to sit in the chair) Nurse Communication: Mobility status         Time: 6219-4712 PT Time Calculation (min) (ACUTE ONLY): 8 min   Charges:   PT Evaluation $PT Eval Low Complexity: 1 Procedure     PT G Codes:        Savior Himebaugh Apr 28, 2016, 10:46 AM  Greggory Stallion, PT, DPT [phone]

## 2016-04-23 NOTE — Progress Notes (Signed)
Pt severely aggitated, trying to leave the room, cussing at staff and threatening to leave, pt unstable on feet, dr. Anselm Jungling notified, orders received

## 2016-04-23 NOTE — Progress Notes (Signed)
Dennis Perkins NAME: Dennis Perkins    MR#:  [id number]  DATE OF BIRTH:  May 14, 1956  SUBJECTIVE:  CHIEF COMPLAINT:   Chief Complaint  Patient presents with  . Emesis  . Diarrhea  . Nausea   - Continues to spike fever until 04/18/16 night, likely malignancy related fevers.  Now no more fever in last 24 hrs. - The patient is more alert after decreasing his fentanyl dose - able to walk in room without trouble, very weak overall. - Asking to have a letter, that he does not have brain mets. - had c/o RU extremity swelling, checked US venous- and there is thrombus. - He was little confused again last evening with hallucinations, so d/c his fentanyl.   He required IV dilaudid frequently. - Asking me, "when can I go home?" later nurse called me he is very confused and agitated.  REVIEW OF SYSTEMS:  Review of Systems  Constitutional: Positive for malaise/fatigue. Negative for fever and chills.  HENT: Negative for hearing loss.   Eyes: Negative for blurred vision and double vision.  Respiratory: Negative for cough, shortness of breath and wheezing.   Cardiovascular: Negative for chest pain, palpitations and leg swelling.  Gastrointestinal: Negative for nausea, vomiting and abdominal pain.  Genitourinary: Negative for dysuria and frequency.  Musculoskeletal: Negative for myalgias and falls.  Skin: Negative for rash.  Neurological: Negative for dizziness, tremors, focal weakness and headaches.    DRUG ALLERGIES:   Allergies  Allergen Reactions  . Levaquin [Levofloxacin] Other (See Comments)    confusion    VITALS:  Blood pressure 137/95, pulse 95, temperature 98.7 F (37.1 C), temperature source Oral, resp. rate 18, height '5\' 9"'$  (1.753 m), weight 68.947 kg (152 lb), SpO2 92 %.  PHYSICAL EXAMINATION:  Physical Exam  GENERAL:  60 y.o.-year-old patient lying in the bed with no acute distress.  EYES: Pupils equal, round,  reactive to light and accommodation. No scleral icterus. Extraocular muscles intact.  HEENT: Head atraumatic, normocephalic. Oropharynx and nasopharynx clear.  NECK:  Supple, no jugular venous distention. No thyroid enlargement, no tenderness.  LUNGS: Normal breath sounds bilaterally, no wheezing, rales,rhonchi or crepitation. No use of accessory muscles of respiration. Decreased right upper lobe breath sounds. CARDIOVASCULAR: S1, S2 normal. No murmurs, rubs, or gallops.  ABDOMEN: Soft, nontender, nondistended. Bowel sounds present. No organomegaly or mass.  EXTREMITIES: No pedal edema, cyanosis, or clubbing. RUL swelling. NEUROLOGIC: More alert and communicating, asking appropriate questions today Following commands, motor strength 5/5 and no sensory deficits  PSYCHIATRIC: The patient is more alert and oriented x 3, appears depressed, no suicidal ideation  SKIN: No obvious rash, lesion, or ulcer.    LABORATORY PANEL:   CBC  Recent Labs Lab 04/22/16 0444  WBC 27.7*  HGB 10.8*  HCT 31.2*  PLT 347   ------------------------------------------------------------------------------------------------------------------  Chemistries   Recent Labs Lab 04/22/16 0444 04/23/16 0449  NA 137 136  K 3.3* 3.7  CL 102 99*  CO2 27 26  GLUCOSE 96 89  BUN 9 10  CREATININE 0.52* 0.62  CALCIUM 7.6* 7.8*  MG 1.8  --    ------------------------------------------------------------------------------------------------------------------  Cardiac Enzymes No results for input(s): TROPONINI in the last 168 hours. ------------------------------------------------------------------------------------------------------------------  RADIOLOGY:  US Venous Img Upper Uni Right  04/22/2016  CLINICAL DATA:  Right upper extremity pain and swelling for the past 3 days. History of malignancy. Evaluate for DVT. EXAM: RIGHT UPPER EXTREMITY VENOUS DOPPLER ULTRASOUND TECHNIQUE: Gray-scale  sonography with graded  compression, as well as color Doppler and duplex ultrasound were performed to evaluate the upper extremity deep venous system from the level of the subclavian vein and including the jugular, axillary, basilic, radial, ulnar and upper cephalic vein. Spectral Doppler was utilized to evaluate flow at rest and with distal augmentation maneuvers. COMPARISON:  Chest radiograph - 04/14/2016; chest CT - 04/14/2016 FINDINGS: Contralateral Subclavian Vein: Respiratory phasicity is normal and symmetric with the symptomatic side. No evidence of thrombus. Normal compressibility. Internal Jugular Vein: No evidence of thrombus. Normal compressibility, respiratory phasicity and response to augmentation. Subclavian Vein: There is occlusive mixed echogenic thrombus within the right subclavian vein, likely secondary to the known invasive right apical pulmonary mass demonstrated on chest CT performed 04/14/2016. Axillary Vein: No evidence of thrombus. Normal compressibility, respiratory phasicity and response to augmentation. Cephalic Vein: No evidence of thrombus. Normal compressibility, respiratory phasicity and response to augmentation. Basilic Vein: No evidence of thrombus. Normal compressibility, respiratory phasicity and response to augmentation. Brachial Veins: No evidence of thrombus. Normal compressibility, respiratory phasicity and response to augmentation. Radial Veins: No evidence of thrombus. Normal compressibility, respiratory phasicity and response to augmentation. Ulnar Veins: No evidence of thrombus. Normal compressibility, respiratory phasicity and response to augmentation. Venous Reflux:  None visualized. Other Findings:  None visualized. IMPRESSION: 1. Examination is positive for occlusive thrombosis within the imaged portions of the right subclavian vein likely secondary to the known invasive right apical pulmonary mass as demonstrated on chest CT performed 04/14/2016. Note, malignant thrombus / direct invasion is  not excluded on the basis this examination. 2. No other evidence of DVT within the right upper extremity. Electronically Signed   By: Sandi Mariscal M.D.   On: 04/22/2016 10:34    EKG:   Orders placed or performed during the hospital encounter of 04/14/16  . EKG 12-Lead  . EKG 12-Lead    ASSESSMENT AND PLAN:   60 year old male with history of COPD, Non-small cell lung cancer with extensive metastatic disease diagnosed at the end of May 2017, essential hypertension presented to the hospital due to intractable nausea and vomiting and also right-sided chest pain from his malignancy.  1.Acute confusion/ AMS- toxic metabolic encephalopathy from pain patch - decreased fentanyl patch dose-much improved mental status now - CT of head without any acute findings - recent MRI of the brain as outpatient was negative for any metastases. - monitor closely - will try to keep pain meds at minimum. - as he again had confusion- 04/22/16- stopped Fentanyl. - Check Ammonia, give ativan as needed. - Changed pain meds to MS contin instead of Fentanyl patch. - Also check UA.  2. Post obstructive pneumonia- with high-grade fevers, blood cultures negative. -CT chest and abdomen on admission showing right upper lobe malignant disease and diffuse metastasis, -ABX changed to Unasyn, appreciate ID consult - Fevers could be from his underlying malignant disease as well. However WBC count is also significantly high- monitor -Became hypothermic after Motrin. Lactic acid is elevated - blood culture is negative. - switch to oral augmentin. Will give total 7 days.  3. Right-sided chest pain and right upper quadrant abdominal pain-extensive metastatic disease from his lung cancer which is on the right side.  Also bone metastases and liver metastases present. - Stopped fentanyl patch due to AMS. - radiation oncology starting palliative radiation. - MS contin for long acting control.  4. Non-small cell lung  cancer-diagnosed at the end of May 2017. Extensive metastatic disease noted as well. MRI  of the brain is negative. -Oncology consulted. Chemotherapy once more stable Palliative care consulted for code status discussion  5. Essential hypertension-continue metoprolol.  6. DVT   He had RUL swelling- checked US venous- Subclavian vein thrombus.   Spoke to Dr. Grayland Ormond and started on Xarelto.  7. hypokalemia   Replace orally.  8. Hypomagnesemia   Replace IV.  All the records are reviewed and case discussed with Care Management/Social Workerr. Management plans discussed with the patient, family and they are in agreement.  CODE STATUS: Full Code  TOTAL TIME TAKING CARE OF THIS PATIENT: 30 minutes.   POSSIBLE D/C IN 2-3 DAYS, DEPENDING ON CLINICAL CONDITION. Trying to switch to oral pain meds- as he is relying too much on IV so far.  Vaughan Basta M.D on 04/23/2016 at 2:37 PM  Between 7am to 6pm - Pager - [phone]  After 6pm go to [email or web address] - password [email or web address]  Office  [phone]  CC: Primary care physician; Default, Provider, MD

## 2016-04-23 NOTE — Plan of Care (Signed)
Problem: Education: Goal: Knowledge of [facility] Education information/materials will improve Outcome: Not Progressing Pt confused  Problem: Pain Managment: Goal: General experience of comfort will improve Outcome: Progressing Pt appears more comfortable this shift  Problem: Fluid Volume: Goal: Ability to maintain a balanced intake and output will improve Outcome: Not Progressing Poor intake.  Continue to encourage pt to drink.  Problem: Nutrition: Goal: Adequate nutrition will be maintained Outcome: Not Progressing Poor appetite  Problem: Respiratory: Goal: Respiratory status will improve Outcome: Not Applicable Date Met:  16/10/96 Lung CA

## 2016-04-24 ENCOUNTER — Ambulatory Visit
Admit: 2016-04-24 | Discharge: 2016-04-24 | Disposition: A | Discharge status: Home / Self Care | Payer: Medicaid Other | Source: Ambulatory Visit | Attending: Radiation Oncology | Admitting: Radiation Oncology

## 2016-04-24 ENCOUNTER — Ambulatory Visit: Admit: 2016-04-24 | Payer: Medicaid Other

## 2016-04-24 ENCOUNTER — Inpatient Hospital Stay: Payer: Medicaid Other

## 2016-04-24 LAB — CBC
HCT: 33 % — ABNORMAL LOW (ref 40.0–52.0)
HEMOGLOBIN: 10.8 g/dL — AB (ref 13.0–18.0)
MCH: 27.1 pg (ref 26.0–34.0)
MCHC: 32.6 g/dL (ref 32.0–36.0)
MCV: 83.2 fL (ref 80.0–100.0)
PLATELETS: 325 10*3/uL (ref 150–440)
RBC: 3.97 MIL/uL — AB (ref 4.40–5.90)
RDW: 15.8 % — ABNORMAL HIGH (ref 11.5–14.5)
WBC: 28.9 10*3/uL — ABNORMAL HIGH (ref 3.8–10.6)

## 2016-04-24 MED ORDER — GADOBENATE DIMEGLUMINE 529 MG/ML IV SOLN
15.0000 mL | Freq: Once | INTRAVENOUS | Status: AC | PRN
  Administered 2016-04-24: 13 mL via INTRAVENOUS

## 2016-04-24 NOTE — Plan of Care (Signed)
Problem: Education: Goal: Knowledge of [facility] Education information/materials will improve Outcome: Not Progressing Pt alert to self only.  Problem: Pain Managment: Goal: General experience of comfort will improve Outcome: Progressing Norco given once for signs of pain with improvement.  Problem: Nutrition: Goal: Adequate nutrition will be maintained Outcome: Not Progressing Poor PO intake. Pt refuses Ensure. Pt encouraged to eat and drink PO fluids.

## 2016-04-24 NOTE — Progress Notes (Signed)
[facility] NAME: Dennis Perkins    MR#:  [id number]  DATE OF BIRTH:  03-31-1956  SUBJECTIVE:  CHIEF COMPLAINT:   Chief Complaint  Patient presents with  . Emesis  . Diarrhea  . Nausea   - Continues to spike fever until 04/18/16 night, likely malignancy related fevers.  - The patient was more alert after decreasing his fentanyl dose - able to walk in room without trouble, very weak overall.  - had c/o RU extremity swelling, checked US venous- and there is thrombus. - He was little confused again last evening with hallucinations, so d/c his fentanyl.    - inspite of stopping fentanyl- he is remaining confused and sometimes agitated for last 2 days- also had some fever today morning. And he have elevated WBCs.  REVIEW OF SYSTEMS:  Review of Systems  Unable to perform ROS: mental acuity    Confused.  DRUG ALLERGIES:   Allergies  Allergen Reactions  . Levaquin [Levofloxacin] Other (See Comments)    confusion    VITALS:  Blood pressure 127/83, pulse 88, temperature 97.9 F (36.6 C), temperature source Oral, resp. rate 18, height '5\' 9"'$  (1.753 m), weight 66.497 kg (146 lb 9.6 oz), SpO2 97 %.  PHYSICAL EXAMINATION:  Physical Exam  GENERAL:  60 y.o.-year-old patient lying in the bed with no acute distress.  EYES: Pupils equal, round, reactive to light and accommodation. No scleral icterus. Extraocular muscles intact.  HEENT: Head atraumatic, normocephalic. Oropharynx and nasopharynx clear.  NECK:  Supple, no jugular venous distention. No thyroid enlargement, no tenderness.  LUNGS: Normal breath sounds bilaterally, no wheezing, rales,rhonchi or crepitation. No use of accessory muscles of respiration. Decreased right upper lobe breath sounds. CARDIOVASCULAR: S1, S2 normal. No murmurs, rubs, or gallops.  ABDOMEN: Soft, nontender, nondistended. Bowel sounds present. No organomegaly or mass.  EXTREMITIES: No pedal edema,  cyanosis, or clubbing. RUL swelling. NEUROLOGIC: alert and communicating, but confused. Following commands, motor strength 4/5 and no sensory deficits  PSYCHIATRIC: The patient is alert, disoriented. SKIN: No obvious rash, lesion, or ulcer.    LABORATORY PANEL:   CBC  Recent Labs Lab 04/24/16 0356  WBC 28.9*  HGB 10.8*  HCT 33.0*  PLT 325   ------------------------------------------------------------------------------------------------------------------  Chemistries   Recent Labs Lab 04/22/16 0444 04/23/16 0449  NA 137 136  K 3.3* 3.7  CL 102 99*  CO2 27 26  GLUCOSE 96 89  BUN 9 10  CREATININE 0.52* 0.62  CALCIUM 7.6* 7.8*  MG 1.8  --    ------------------------------------------------------------------------------------------------------------------  Cardiac Enzymes No results for input(s): TROPONINI in the last 168 hours. ------------------------------------------------------------------------------------------------------------------  RADIOLOGY:  No results found.  EKG:   Orders placed or performed during the hospital encounter of 04/14/16  . EKG 12-Lead  . EKG 12-Lead    ASSESSMENT AND PLAN:   60 year old male with history of COPD, Non-small cell lung cancer with extensive metastatic disease diagnosed at the end of May 2017, essential hypertension presented to the hospital due to intractable nausea and vomiting and also right-sided chest pain from his malignancy.  1.Acute confusion/ AMS- toxic metabolic encephalopathy from pain patch - decreased fentanyl patch dose-and later discontinued, still remained confused. - as he again had confusion- 04/22/16- stopped Fentanyl. - CT of head without any acute findings - recent MRI of the brain as outpatient was negative for any metastases. - he have some fever, will repeat MRI ad call neurologist consult. - will try to  keep pain meds at minimum.  - Checked Ammonia- normal, give ativan as needed. - Changed  pain meds to MS contin instead of Fentanyl patch. - Also checked UA- negative.  2. Post obstructive pneumonia- with high-grade fevers, blood cultures negative. -CT chest and abdomen on admission showing right upper lobe malignant disease and diffuse metastasis, -ABX changed to Unasyn, appreciate ID consult - Fevers could be from his underlying malignant disease as well. However WBC count is also significantly high- monitor -Became hypothermic after Motrin. Lactic acid is elevated - blood culture is negative. - switch to oral augmentin. Will give total 7 days. - due to repeated fever- will check blood culture again.  3. Right-sided chest pain and right upper quadrant abdominal pain-extensive metastatic disease from his lung cancer which is on the right side.   Also bone metastases and liver metastases present. - Stopped fentanyl patch due to AMS. - radiation oncology starting palliative radiation. - MS contin for long acting control.  4. Non-small cell lung cancer-diagnosed at the end of May 2017. Extensive metastatic disease noted as well. MRI of the brain is negative. -Oncology consulted. Chemotherapy once more stable Palliative care consulted for code status discussion  5. Essential hypertension-continue metoprolol.  6. DVT   He had RUL swelling- checked US venous- Subclavian vein thrombus.   Spoke to Dr. Grayland Ormond and started on Xarelto.  7. hypokalemia   Replace orally.  8. Hypomagnesemia   Replace IV.  All the records are reviewed and case discussed with Care Management/Social Workerr. Management plans discussed with the patient, family and they are in agreement.  CODE STATUS: Full Code  TOTAL TIME TAKING CARE OF THIS PATIENT: 35 minutes.   POSSIBLE D/C IN 2-3 DAYS, DEPENDING ON CLINICAL CONDITION.  Confused and fever, with rise in WBCs.  Called neurology consult. Called her daughter and left a msg on her phone.  Vaughan Basta M.D on 04/24/2016 at 1:22  PM  Between 7am to 6pm - Pager - [phone]  After 6pm go to [email or web address] - password [email or web address] [facility]  Office  [phone]  CC: Primary care physician; Default, Provider, MD

## 2016-04-24 NOTE — Care Management (Signed)
Transported to [facility] for simulation today. Will begin radiation next Wednesday.  WBC's 28.9 today. Low grade temperature = 99.1 Shelbie Ammons RN MSN [facility] Care Management [phone]

## 2016-04-25 ENCOUNTER — Ambulatory Visit: Payer: Self-pay

## 2016-04-25 ENCOUNTER — Ambulatory Visit: Payer: Medicaid Other

## 2016-04-25 DIAGNOSIS — Z515 Encounter for palliative care: Secondary | ICD-10-CM

## 2016-04-25 DIAGNOSIS — Z66 Do not resuscitate: Secondary | ICD-10-CM

## 2016-04-25 DIAGNOSIS — R4182 Altered mental status, unspecified: Secondary | ICD-10-CM

## 2016-04-25 LAB — COMPREHENSIVE METABOLIC PANEL
ALBUMIN: 1.5 g/dL — AB (ref 3.5–5.0)
ALT: 29 U/L (ref 17–63)
ANION GAP: 11 (ref 5–15)
AST: 109 U/L — ABNORMAL HIGH (ref 15–41)
Alkaline Phosphatase: 259 U/L — ABNORMAL HIGH (ref 38–126)
BUN: 14 mg/dL (ref 6–20)
CO2: 27 mmol/L (ref 22–32)
Calcium: 8 mg/dL — ABNORMAL LOW (ref 8.9–10.3)
Chloride: 101 mmol/L (ref 101–111)
Creatinine, Ser: 0.61 mg/dL (ref 0.61–1.24)
GFR calc Af Amer: >60 mL/min (ref >60)
GFR calc non Af Amer: >60 mL/min (ref >60)
GLUCOSE: 86 mg/dL (ref 65–99)
POTASSIUM: 3.2 mmol/L — AB (ref 3.5–5.1)
SODIUM: 139 mmol/L (ref 135–145)
TOTAL PROTEIN: 5.7 g/dL — AB (ref 6.5–8.1)
Total Bilirubin: 0.6 mg/dL (ref 0.3–1.2)

## 2016-04-25 LAB — CBC
HEMATOCRIT: 31.4 % — AB (ref 40.0–52.0)
HEMOGLOBIN: 10.3 g/dL — AB (ref 13.0–18.0)
MCH: 27.4 pg (ref 26.0–34.0)
MCHC: 32.8 g/dL (ref 32.0–36.0)
MCV: 83.4 fL (ref 80.0–100.0)
Platelets: 302 10*3/uL (ref 150–440)
RBC: 3.77 MIL/uL — ABNORMAL LOW (ref 4.40–5.90)
RDW: 15.7 % — ABNORMAL HIGH (ref 11.5–14.5)
WBC: 28.4 10*3/uL — ABNORMAL HIGH (ref 3.8–10.6)

## 2016-04-25 LAB — FOLATE

## 2016-04-25 LAB — VITAMIN B12: Vitamin B-12: 351 pg/mL (ref 180–914)

## 2016-04-25 MED ORDER — THIAMINE HCL 100 MG/ML IJ SOLN
100.0000 mg | Freq: Three times a day (TID) | INTRAMUSCULAR | Status: DC
  Administered 2016-04-25 – 2016-04-26 (×3): 100 mg via INTRAVENOUS
  Filled 2016-04-25: qty 2
  Filled 2016-04-25: qty 1
  Filled 2016-04-25: qty 2
  Filled 2016-04-25 (×2): qty 1

## 2016-04-25 MED ORDER — MORPHINE SULFATE (CONCENTRATE) 10 MG/0.5ML PO SOLN
5.0000 mg | ORAL | Status: DC | PRN
  Administered 2016-04-25 – 2016-04-26 (×2): 5 mg via ORAL
  Filled 2016-04-25 (×2): qty 1

## 2016-04-25 MED ORDER — KCL IN DEXTROSE-NACL 40-5-0.9 MEQ/L-%-% IV SOLN
INTRAVENOUS | Status: DC
  Administered 2016-04-25 – 2016-04-26 (×2): via INTRAVENOUS
  Filled 2016-04-25 (×4): qty 1000

## 2016-04-25 NOTE — Consult Note (Signed)
Reason for Consult:Altered mental status Referring Physician: Anselm Jungling  CC: Altered mental status  HPI: Dennis Perkins is an 60 y.o. male with a known history of COPD, essential hypertension, metastatic lung cancer recently diagnosed who presents to the hospital due to intractable nausea vomiting and pain on the right side of his chest. 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 He was recently discharged on MS Contin and oxycodone as needed for pain but has been having significant nausea and vomiting and has not been able to keep those medications down. He presents to the emergency room and was noted to be severely hypokalemic with a potassium of 2.6.  Ha shad a 40 pound weight loss in the past 3-6 months. Since hospitalization patient has been noted to have an altered mental status.  Confused.    Past Medical History  Diagnosis Date  . Lung cancer ([place])   . COPD (chronic obstructive pulmonary disease) ([place])   . Essential hypertension     Past Surgical History  Procedure Laterality Date  . I&d extremity Right 03/17/2013    Procedure: IRRIGATION AND DEBRIDEMENT EXTREMITY, flexor tendon repair;  Surgeon: Linna Hoff, MD;  Location: [facility];  Service: Orthopedics;  Laterality: Right;  . Knee surgery Bilateral     Family History  Problem Relation Age of Onset  . Breast cancer Mother   . Lung cancer Father     Social History:  reports that he has been smoking Cigarettes.  He has a 20 pack-year smoking history. He has never used smokeless tobacco. He reports that he drinks about 16.8 oz of alcohol per week. He reports that he does not use illicit drugs.  Allergies  Allergen Reactions  . Levaquin [Levofloxacin] Other (See Comments)    confusion    Medications:  I have reviewed the patient's current medications. Prior to Admission:  Prescriptions prior to admission  Medication Sig Dispense Refill Last  Dose  . famotidine (PEPCID) 20 MG tablet Take 1 tablet (20 mg total) by mouth 2 (two) times daily. 60 tablet 0 didn't fill yet  . ibuprofen (ADVIL,MOTRIN) 200 MG tablet Take 200 mg by mouth every 6 (six) hours as needed for fever.    Past Month at Unknown time  . metoprolol tartrate (LOPRESSOR) 25 MG tablet Take 1 tablet (25 mg total) by mouth 2 (two) times daily. 60 tablet 0 04/13/2016 at 1830  . polyethylene glycol (MIRALAX / GLYCOLAX) packet Take 17 g by mouth daily as needed for mild constipation or moderate constipation. 14 each 0 Past Month at Unknown time  . levofloxacin (LEVAQUIN) 750 MG tablet Take 1 tablet (750 mg total) by mouth daily. (Patient not taking: Reported on 04/14/2016) 4 tablet 0 Not Taking at Unknown time  . morphine (MS CONTIN) 15 MG 12 hr tablet Take 1 tablet (15 mg total) by mouth every 12 (twelve) hours. (Patient not taking: Reported on 04/14/2016) 60 tablet 0 Not Taking at Unknown time  . oxyCODONE (OXY IR/ROXICODONE) 5 MG immediate release tablet Take 1 tablet (5 mg total) by mouth every 4 (four) hours as needed for moderate pain or breakthrough pain. (Patient not taking: Reported on 04/14/2016) 30 tablet 0 Not Taking at Unknown time   Scheduled: . amoxicillin-clavulanate  1 tablet Oral Q12H  . famotidine  20 mg Oral BID  . feeding supplement (ENSURE ENLIVE)  237 mL Oral TID BM  . metoprolol tartrate  50 mg Oral  BID  . morphine  15 mg Oral Q12H  . potassium chloride  40 mEq Oral BID  . Rivaroxaban  15 mg Oral BID WC  . [START ON 05/13/2016] rivaroxaban  20 mg Oral Q supper  . senna-docusate  2 tablet Oral QHS    ROS: History obtained from chart review  General ROS: night sweats, weight loss Psychological ROS: negative for - behavioral disorder, hallucinations, memory difficulties, mood swings or suicidal ideation Ophthalmic ROS: negative for - blurry vision, double vision, eye pain or loss of vision ENT ROS: negative for - epistaxis, nasal discharge, oral lesions,  sore throat, tinnitus or vertigo Allergy and Immunology ROS: negative for - hives or itchy/watery eyes Hematological and Lymphatic ROS: negative for - bleeding problems, bruising or swollen lymph nodes Endocrine ROS: negative for - galactorrhea, hair pattern changes, polydipsia/polyuria or temperature intolerance Respiratory ROS: negative for - cough, hemoptysis, shortness of breath or wheezing Cardiovascular ROS: negative for - chest pain, dyspnea on exertion, edema or irregular heartbeat Gastrointestinal ROS: negative for - abdominal pain, diarrhea, hematemesis, nausea/vomiting or stool incontinence Genito-Urinary ROS: as noted in HPI Musculoskeletal ROS: negative for - joint swelling or muscular weakness Neurological ROS: as noted in HPI Dermatological ROS: negative for rash and skin lesion changes  Physical Examination: Blood pressure 147/88, pulse 96, temperature 97.8 F (36.6 C), temperature source Oral, resp. rate 18, height '5\' 9"'$  (1.753 m), weight 63.64 kg (140 lb 4.8 oz), SpO2 90 %.  HEENT-  Normocephalic, no lesions, without obvious abnormality.  Normal external eye and conjunctiva.  Normal TM's bilaterally.  Normal auditory canals and external ears. Normal external nose, mucus membranes and septum.  Normal pharynx. Cardiovascular- S1, S2 normal, pulses palpable throughout   Lungs- chest clear, no wheezing, rales, normal symmetric air entry Abdomen- soft, non-tender; bowel sounds normal; no masses,  no organomegaly Extremities- no edema Lymph-no adenopathy palpable Musculoskeletal-no joint tenderness, deformity or swelling Skin-warm and dry, no hyperpigmentation, vitiligo, or suspicious lesions  Neurological Examination Mental Status: Alert but starring off into space for th majority of the examination.  Speech minimal and delayed but fluent.  Able to follow simple commands without difficulty.  Knows his name, the year and that he is in the hospital.  Unable to tell me more  detailed information.   Cranial Nerves: II: Discs flat bilaterally; Blinks to bilateral confrontation, pupils equal, round, reactive to light and accommodation III,IV, VI: ptosis not present, extra-ocular motions intact bilaterally V,VII: smile symmetric, facial light touch sensation normal bilaterally VIII: hearing normal bilaterally IX,X: gag reflex present XI: bilateral shoulder shrug XII: midline tongue extension Motor: Able to lift all extremities strongly against gravity Sensory: Pinprick and light touch intact throughout, bilaterally Deep Tendon Reflexes: 2+ in the upper extremities and trace in the lower extremities Plantars: Right: downgoing   Left: downgoing Cerebellar: Normal finger-to-nose testing bilateraelly Gait: not tested due to safety concerns  Laboratory Studies:   Basic Metabolic Panel:  Recent Labs Lab 04/19/16 0442 04/20/16 0555 04/21/16 0509 04/22/16 0444 04/23/16 0449 04/25/16 0450  NA 138 140 137 137 136 139  K 3.2* 3.3* 3.0* 3.3* 3.7 3.2*  CL 102 105 102 102 99* 101  CO2 '29 27 25 27 26 27  '$ GLUCOSE 100* 96 98 96 89 86  BUN '9 9 8 9 10 14  '$ CREATININE 0.61 0.58* 0.63 0.52* 0.62 0.61  CALCIUM 7.9* 7.5* 7.7* 7.6* 7.8* 8.0*  MG 1.6*  --  1.5* 1.8  --   --  Liver Function Tests:  Recent Labs Lab 04/25/16 0450  AST 109*  ALT 29  ALKPHOS 259*  BILITOT 0.6  PROT 5.7*  ALBUMIN 1.5*   No results for input(s): LIPASE, AMYLASE in the last 168 hours.  Recent Labs Lab 04/19/16 2011 04/23/16 1506  AMMONIA <9* <9*    CBC:  Recent Labs Lab 04/20/16 0555 04/21/16 0509 04/22/16 0444 04/24/16 0356 04/25/16 0450  WBC 22.6* 23.3* 27.7* 28.9* 28.4*  HGB 11.1* 10.8* 10.8* 10.8* 10.3*  HCT 33.8* 32.4* 31.2* 33.0* 31.4*  MCV 83.9 83.2 82.0 83.2 83.4  PLT 355 365 347 325 302    Cardiac Enzymes: No results for input(s): CKTOTAL, CKMB, CKMBINDEX, TROPONINI in the last 168 hours.  BNP: Invalid input(s): POCBNP  CBG: No results for  input(s): GLUCAP in the last 168 hours.  Microbiology: Results for orders placed or performed during the hospital encounter of 04/14/16  CULTURE, BLOOD (ROUTINE X 2) w Reflex to ID Panel     Status: None   Collection Time: 04/16/16  5:49 AM  Result Value Ref Range Status   Specimen Description BLOOD RIGHT HAND  Final   Special Requests BOTTLES DRAWN AEROBIC AND ANAEROBIC 8ML  Final   Culture NO GROWTH 5 DAYS  Final   Report Status 04/21/2016 FINAL  Final  CULTURE, BLOOD (ROUTINE X 2) w Reflex to ID Panel     Status: None   Collection Time: 04/16/16  5:53 AM  Result Value Ref Range Status   Specimen Description BLOOD RIGHT HAND  Final   Special Requests   Final    BOTTLES DRAWN AEROBIC AND ANAEROBIC AER 8ML ANA 10ML   Culture NO GROWTH 5 DAYS  Final   Report Status 04/21/2016 FINAL  Final  CULTURE, BLOOD (ROUTINE X 2) w Reflex to ID Panel     Status: None (Preliminary result)   Collection Time: 04/24/16  1:29 PM  Result Value Ref Range Status   Specimen Description BLOOD LEFT ASSIST CONTROL  Final   Special Requests BOTTLES DRAWN AEROBIC AND ANAEROBIC  3CC  Final   Culture NO GROWTH < 24 HOURS  Final   Report Status PENDING  Incomplete  CULTURE, BLOOD (ROUTINE X 2) w Reflex to ID Panel     Status: None (Preliminary result)   Collection Time: 04/24/16  1:45 PM  Result Value Ref Range Status   Specimen Description BLOOD LEFT ARM  Final   Special Requests   Final    BOTTLES DRAWN AEROBIC AND ANAEROBIC  AEROBIC 5CC, ANAEROBIC 3CC   Culture NO GROWTH < 24 HOURS  Final   Report Status PENDING  Incomplete    Coagulation Studies: No results for input(s): LABPROT, INR in the last 72 hours.  Urinalysis:  Recent Labs Lab 04/23/16 1845  COLORURINE YELLOW*  LABSPEC 1.018  PHURINE 6.0  GLUCOSEU NEGATIVE  HGBUR NEGATIVE  BILIRUBINUR NEGATIVE  KETONESUR TRACE*  PROTEINUR 30*  NITRITE NEGATIVE  LEUKOCYTESUR NEGATIVE    Lipid Panel:  No results found for: CHOL, TRIG, HDL,  CHOLHDL, VLDL, LDLCALC  HgbA1C: No results found for: HGBA1C  Urine Drug Screen:     Component Value Date/Time   LABOPIA NONE DETECTED 02/27/2015 0020   COCAINSCRNUR NONE DETECTED 02/27/2015 0020   LABBENZ NONE DETECTED 02/27/2015 0020   AMPHETMU NONE DETECTED 02/27/2015 0020   THCU NONE DETECTED 02/27/2015 0020   LABBARB NONE DETECTED 02/27/2015 0020    Alcohol Level: No results for input(s): ETH in the last 168 hours.  Other  results: EKG: normal sinus rhythm at 90 bpm.  Imaging: Mr Kizzie Fantasia Contrast  04/24/2016  CLINICAL DATA:  Acute confusional state. Toxic metabolic encephalopathy. EXAM: MRI HEAD WITHOUT AND WITH CONTRAST TECHNIQUE: Multiplanar, multiecho pulse sequences of the brain and surrounding structures were obtained without and with intravenous contrast. CONTRAST:  32m MULTIHANCE GADOBENATE DIMEGLUMINE 529 MG/ML IV SOLN COMPARISON:  Head CT 04/17/2016.  MRI 03/24/2016. FINDINGS: Diffusion imaging does not show any acute or subacute infarction presently or any other cause of restricted diffusion. The brainstem appears normal. No focal cerebellar abnormality is seen. Cerebral hemispheres are normal except for mild generalized atrophy an a few punctate foci of T2 and FLAIR signal in the basal ganglia and deep white matter consistent with minimal small vessel disease. No cortical or large vessel territory infarction. After contrast administration, no abnormal enhancement occurs. No evidence of mass lesion. No hydrocephalus or extra-axial collection. Tiny focus of hemosiderin deposition in the subcortical white matter of the left parietal lobe, nonspecific. No pituitary mass. There chronic inflammatory changes of the left maxillary sinus with sinus volume loss, consistent with silent sinus syndrome. IMPRESSION: No acute or significant intracranial finding. Mild generalized volume loss. Evidence of minimal small vessel change affecting the basal ganglia and hemispheric deep white matter.  No sign of metastatic disease. Punctate foci of restricted diffusion seen previously could have related to small embolic infarctions which are no longer appreciable. Electronically Signed   By: MNelson ChimesM.D.   On: 04/24/2016 15:19     Assessment/Plan: 60year old male with advanced metastatic lung cancer and now with mental status changes. There has been an attempt to change medications in hopes that his mental status would improve but this has not been the case.  He also remains febrile and with an elevated white blood cell count and can not rule out the possibility that this may be secondary to a metabolic encephalopathy.  Carcinomatous meningitis is on the differential as well but the patient is not a LP candidate due to being on NOAC.   MRI of the brain has been performed and personally reviewed and does not show evidence of metastatic disease.  Lastly, the patient has had weight loss and nausea/vomiting.  He is likely malnutritioned.  Can not rule out a Wernicke's encephalopathy.  Would supplement high dose thiamine.    Recommendations: 1.  Thiamine '500mg'$  IV q 8 hours for 2 days then 250 mg IV daily for 5 days 2.  B12, thiamine, folate levels  LAlexis Goodell MD Neurology [phone]/29/2017, 1:18 PM

## 2016-04-25 NOTE — Plan of Care (Signed)
Problem: Education: Goal: Knowledge of [facility] Education information/materials will improve Outcome: Not Progressing Pt confused, alert to self only.     Problem: Pain Managment: Goal: General experience of comfort will improve Outcome: Progressing Morphine PO given once for signs of pain with improvement. Continue on MS contin. Radiation canceled today. Possible discharge to home with hospice.  Problem: Fluid Volume: Goal: Ability to maintain a balanced intake and output will improve Outcome: Progressing Pt does not eat nor drink ensure. PO fluids encouraged. Some apple sauce given. IVF and vitamin B-1 initiated.

## 2016-04-25 NOTE — Progress Notes (Signed)
[facility] [place] NAME: Dennis Perkins    MR#:  [id number]  DATE OF BIRTH:  [birth year]  SUBJECTIVE:  CHIEF COMPLAINT:   Chief Complaint  Patient presents with  . Emesis  . Diarrhea  . Nausea   - Continues to spike fever until 04/18/16 night, likely malignancy related fevers.  - The patient was more alert after decreasing his fentanyl dose initially - able to walk in room without trouble, very weak overall.  - had c/o RU extremity swelling, checked US venous- and there is thrombus. - He was little confused again with hallucinations, so d/c his fentanyl.    - inspite of stopping fentanyl- he is remaining confused and sometimes agitated for last 2 days- also had some fever  And he have elevated WBCs. - now last 2-3 days completely disoriented and not eating well.  REVIEW OF SYSTEMS:  Review of Systems  Unable to perform ROS: mental acuity    Confused.  DRUG ALLERGIES:   Allergies  Allergen Reactions  . Levaquin [Levofloxacin] Other (See Comments)    confusion    VITALS:  Blood pressure 147/88, pulse 96, temperature 97.8 F (36.6 C), temperature source Oral, resp. rate 18, height '5\' 9"'$  (1.753 m), weight 63.64 kg (140 lb 4.8 oz), SpO2 90 %.  PHYSICAL EXAMINATION:  Physical Exam  GENERAL:  60 y.o.-year-old patient lying in the bed with no acute distress.  EYES: Pupils equal, round, reactive to light and accommodation. No scleral icterus. Extraocular muscles intact.  HEENT: Head atraumatic, normocephalic. Oropharynx and nasopharynx clear.  NECK:  Supple, no jugular venous distention. No thyroid enlargement, no tenderness.  LUNGS: Normal breath sounds bilaterally, no wheezing, rales,rhonchi or crepitation. No use of accessory muscles of respiration. Decreased right upper lobe breath sounds. CARDIOVASCULAR: S1, S2 normal. No murmurs, rubs, or gallops.  ABDOMEN: Soft, nontender, nondistended. Bowel sounds present. No  organomegaly or mass.  EXTREMITIES: No pedal edema, cyanosis, or clubbing. RUL swelling. NEUROLOGIC: alert and communicating, but confused. Following commands, motor strength 4/5 and no sensory deficits  PSYCHIATRIC: The patient is alert, disoriented. SKIN: No obvious rash, lesion, or ulcer.    LABORATORY PANEL:   CBC  Recent Labs Lab 04/25/16 0450  WBC 28.4*  HGB 10.3*  HCT 31.4*  PLT 302   ------------------------------------------------------------------------------------------------------------------  Chemistries   Recent Labs Lab 04/22/16 0444  04/25/16 0450  NA 137  < > 139  K 3.3*  < > 3.2*  CL 102  < > 101  CO2 27  < > 27  GLUCOSE 96  < > 86  BUN 9  < > 14  CREATININE 0.52*  < > 0.61  CALCIUM 7.6*  < > 8.0*  MG 1.8  --   --   AST  --   --  109*  ALT  --   --  29  ALKPHOS  --   --  259*  BILITOT  --   --  0.6  < > = values in this interval not displayed. ------------------------------------------------------------------------------------------------------------------  Cardiac Enzymes No results for input(s): TROPONINI in the last 168 hours. ------------------------------------------------------------------------------------------------------------------  RADIOLOGY:  Mr Kizzie Fantasia Contrast  04/24/2016  CLINICAL DATA:  Acute confusional state. Toxic metabolic encephalopathy. EXAM: MRI HEAD WITHOUT AND WITH CONTRAST TECHNIQUE: Multiplanar, multiecho pulse sequences of the brain and surrounding structures were obtained without and with intravenous contrast. CONTRAST:  73m MULTIHANCE GADOBENATE DIMEGLUMINE 529 MG/ML IV SOLN COMPARISON:  Head CT 04/17/2016.  MRI  03/24/2016. FINDINGS: Diffusion imaging does not show any acute or subacute infarction presently or any other cause of restricted diffusion. The brainstem appears normal. No focal cerebellar abnormality is seen. Cerebral hemispheres are normal except for mild generalized atrophy an a few punctate foci of T2  and FLAIR signal in the basal ganglia and deep white matter consistent with minimal small vessel disease. No cortical or large vessel territory infarction. After contrast administration, no abnormal enhancement occurs. No evidence of mass lesion. No hydrocephalus or extra-axial collection. Tiny focus of hemosiderin deposition in the subcortical white matter of the left parietal lobe, nonspecific. No pituitary mass. There chronic inflammatory changes of the left maxillary sinus with sinus volume loss, consistent with silent sinus syndrome. IMPRESSION: No acute or significant intracranial finding. Mild generalized volume loss. Evidence of minimal small vessel change affecting the basal ganglia and hemispheric deep white matter. No sign of metastatic disease. Punctate foci of restricted diffusion seen previously could have related to small embolic infarctions which are no longer appreciable. Electronically Signed   By: Nelson Chimes M.D.   On: 04/24/2016 15:19    EKG:   Orders placed or performed during the hospital encounter of 04/14/16  . EKG 12-Lead  . EKG 12-Lead    ASSESSMENT AND PLAN:   60 year old male with history of COPD, Non-small cell lung cancer with extensive metastatic disease diagnosed at the end of May 2017, essential hypertension presented to the hospital due to intractable nausea and vomiting and also right-sided chest pain from his malignancy.  1.Acute confusion/ AMS- toxic metabolic encephalopathy from pain patch - decreased fentanyl patch dose-and later discontinued, still remained confused. - as he again had confusion- 04/22/16- stopped Fentanyl. - CT of head without any acute findings - recent MRI of the brain as outpatient was negative for any metastases. - he have some fever, repeat MRI negative. - will try to keep pain meds at minimum. - Appreciated neurology help- due to his malnutrition- ordered IV thiamin for possible wernicke's encephalopathy.  - Checked Ammonia-  normal, give ativan as needed. - Changed pain meds to MS contin instead of Fentanyl patch. - Also checked UA- negative.  2. Post obstructive pneumonia- with high-grade fevers, blood cultures negative. -CT chest and abdomen on admission showing right upper lobe malignant disease and diffuse metastasis, -ABX changed to Unasyn, appreciate ID consult - Fevers could be from his underlying malignant disease as well. However WBC count is also significantly high- monitor -Became hypothermic after Motrin. Lactic acid is elevated - blood culture is negative. - switch to oral augmentin. Finished 7 days. - due to repeated fever- repeat blood culture- 04/24/16- negative so far.  3. Right-sided chest pain and right upper quadrant abdominal pain-extensive metastatic disease from his lung cancer which is on the right side.   Also bone metastases and liver metastases present. - Stopped fentanyl patch due to AMS. - radiation oncology starting palliative radiation. - MS contin for long acting control. - currently hold radiation due to his AMS.  4. Non-small cell lung cancer-diagnosed at the end of May 2017. Extensive metastatic disease noted as well. MRI of the brain is negative. -Oncology consulted. Chemotherapy once more stable Palliative care consulted for code status discussion - called palliative care again today- as due to his persistent AMS- sister want to consider hospice.  5. Essential hypertension-continue metoprolol.  6. DVT   He had RUL swelling- checked US venous- Subclavian vein thrombus.   Spoke to Dr. Grayland Ormond and started on Xarelto.  7. hypokalemia   Replace orally.  8. Hypomagnesemia   Replace IV.  All the records are reviewed and case discussed with Care Management/Social Workerr. Management plans discussed with the patient, family and they are in agreement.  CODE STATUS: Full Code  TOTAL TIME TAKING CARE OF THIS PATIENT: 45 minutes.  Had discussion with his sister and  neurologist on phone.  POSSIBLE D/C IN 2-3 DAYS, DEPENDING ON CLINICAL CONDITION.  Confused and with rise in WBCs.  Called neurology consult. Palliative care consult.  Vaughan Basta M.D on 04/25/2016 at 3:34 PM  Between 7am to 6pm - Pager - [phone]  After 6pm go to [email or web address] - password [email or web address] [facility]  Office  [phone]  CC: Primary care physician; Default, Provider, MD

## 2016-04-25 NOTE — Care Management (Signed)
Spoke with Dr. Anselm Jungling. Will re-consult palliative care to meet with the family for goals of care.  Spoke with sister, Liborio Nixon, yesterday. Would like [facility] in the home. Flo Shanks, RN representative updated [place] management [phone]

## 2016-04-25 NOTE — Progress Notes (Signed)
This NP spoke to sister/ Amalia Hailey over the phone.    She verbalized her wish for her brother to discharge home with hospice services tomorrow.    She understands his limited prognosis.  We scheduled a meeting for today at 3:15 pm but she did not show or call, so no formal GOC was not completed.  Flo Shanks RN Hospice liaison aware of the situation.  Wadie Lessen NP  Palliative Medicine Team Team Phone # [phone] Pager [phone]

## 2016-04-25 NOTE — Progress Notes (Signed)
Spoke with Maudie Mercury, RN from the Cancer center. Pt is scheduled now for radiation. Notified Dr Anselm Jungling. Per MD to hold off on radiation today. Palliative care to see pt and discuss the plan of care with family. Maudie Mercury, RN is aware and per Dr Donella Stade radiation can be resumed on Monday. Dr Anselm Jungling notified.

## 2016-04-25 NOTE — Plan of Care (Signed)
Problem: Safety: Goal: Ability to remain free from injury will improve Outcome: Not Progressing Patient has confusion and forgets to call for assistance before getting out of bed.  Bed alarm is on to prevent falls.  Problem: Nutrition: Goal: Adequate nutrition will be maintained Outcome: Not Progressing Patient with poor appetite and poor PO intake this shift.

## 2016-04-26 ENCOUNTER — Ambulatory Visit: Payer: Medicaid Other

## 2016-04-26 MED ORDER — MORPHINE SULFATE (CONCENTRATE) 10 MG/0.5ML PO SOLN: 5.0000 mg | ORAL | Status: AC | PRN

## 2016-04-26 MED ORDER — MORPHINE SULFATE ER 15 MG PO TBCR: 15.0000 mg | EXTENDED_RELEASE_TABLET | Freq: Two times a day (BID) | ORAL | Status: AC

## 2016-04-26 NOTE — Clinical Social Work Note (Signed)
CSW was updated by [facility] and Hospital Liaison regarding pt's discharge plan. Pt will now discharge to [facility]. CSW updated the Medical Necessity Form. CSW is signing off as no further needs identified.   Darden Dates, MSW, LCSW  Clinical Social Worker  [phone]

## 2016-04-26 NOTE — Progress Notes (Signed)
Case manager and nurse- spoke to pt' family. His daughter and Sister both agreed on hospice home service- as per Hospice nurse Santiago Glad.  i will discharge pt to hospice home.

## 2016-04-26 NOTE — Care Management (Addendum)
EMS packet completed with DNR. Pending contact with family to arrange services and equipment in the home prior to discharge. Plan in place and plan for discharge today. No further RNCM needs.

## 2016-04-26 NOTE — Progress Notes (Signed)
New referral for hospice services, now for [facility]osp[facility]. Writer has spoken to Dennis Perkins's sister Dennis Perkins [phone]) and with his daughter Dennis Perkins [phone]) and both are in agreement. Writer's conversation with patient's sister revealed several issues with a discharge to home.  Dennis Perkins is 60 year old man recently diagnosed with stage IV metastatic lung cancer admitted to [facility] on 6/18 for evaluation and treatment of nausea/vomiting/diarrhea. He has been treated with antibiotics and IV fluids, he has continued to decline, now with very little oral intake (o% of meals for the last few days). He developed more confusion yesterday and has been treated with IV thiamine for possible Wernicke's encephalopathy per neurology consult recommendations. Today he has been more lethargic and has required a PRN dose of dilaudid 1 mg IV as well as 5 mg of oral morphine for management of pain. He also had an elevated oral temp this morning of 101.3 and received tylenol. Of note patient was able to swallow his MS Contin this morning. Plan is for discharge today to the hospice home with signed portable DNR in place. Hospital care team are aware and in agreement. Report called to the hospice home. EMS to be notified for transport. Thank you. Flo Shanks RN, BSN, [facility] [place], hospital Liaison [phone]

## 2016-04-26 NOTE — Discharge Summary (Signed)
[facility] NAME: Dennis Perkins    MR#:  [id number]  DATE OF BIRTH:  02/11/1956  DATE OF ADMISSION:  04/14/2016 ADMITTING PHYSICIAN: Henreitta Leber, MD  DATE OF DISCHARGE: 04/26/2016  PRIMARY CARE PHYSICIAN: Default, Provider, MD    ADMISSION DIAGNOSIS:  Hypokalemia [E87.6] Leukocytosis [D72.829] Metastatic cancer ([place]) [C80.1] Non-intractable vomiting with nausea, vomiting of unspecified type [R11.2]  DISCHARGE DIAGNOSIS:  Active Problems:   Intractable nausea and vomiting   Protein-calorie malnutrition, severe   Pneumonia   DNR (do not resuscitate)   Palliative care encounter   Metastatic cancer   Altered mental status  SECONDARY DIAGNOSIS:   Past Medical History  Diagnosis Date  . Lung cancer ([place])   . COPD (chronic obstructive pulmonary disease) ([place])   . Essential hypertension     HOSPITAL COURSE:   1.Acute confusion/ AMS- toxic metabolic encephalopathy from pain patch - decreased fentanyl patch dose-and later discontinued, still remained confused. - as he again had confusion- 04/22/16- stopped Fentanyl. - CT of head without any acute findings - recent MRI of the brain as outpatient was negative for any metastases. - he have some fever, repeat MRI negative. - try to keep pain meds at minimum. - Appreciated neurology help- due to his malnutrition- ordered IV thiamin for possible wernicke's encephalopathy. - did not had much improvement, family agreed on hospice care.  - Checked Ammonia- normal, give ativan as needed. - Changed pain meds to MS contin instead of Fentanyl patch. - Also checked UA- negative.  2. Post obstructive pneumonia- with high-grade fevers, blood cultures negative. -CT chest and abdomen on admission showing right upper lobe malignant disease and diffuse metastasis, -ABX changed to Unasyn, appreciate ID consult - Fevers could be from his underlying malignant disease as well.  However WBC count is also significantly high- monitor -Became hypothermic after Motrin. Lactic acid is elevated - blood culture is negative. - switch to oral augmentin. Finished 7 days. - due to repeated fever- repeat blood culture- 04/24/16- negative so far.  3. Right-sided chest pain and right upper quadrant abdominal pain-extensive metastatic disease from his lung cancer which is on the right side.  Also bone metastases and liver metastases present. - Stopped fentanyl patch due to AMS. - radiation oncology starting palliative radiation. - MS contin for long acting control. - also giving oral roxanol for better control. - now being transferred to hospice care.  4. Non-small cell lung cancer-diagnosed at the end of May 2017. Extensive metastatic disease noted as well. MRI of the brain is negative. -Oncology consulted. Chemotherapy once more stable Palliative care consulted for code status discussion - called palliative care - as due to his persistent AMS- sister want to consider hospice.  5. Essential hypertension-continue metoprolol.  6. DVT  He had RUL swelling- checked US venous- Subclavian vein thrombus.  Spoke to Dr. Grayland Ormond and started on Xarelto.  7. hypokalemia  Replace orally.  8. Hypomagnesemia  Replace IV.  As being transferred to hospice- hold all other meds, than needed for his comfort.  DISCHARGE CONDITIONS:   Stable.  CONSULTS OBTAINED:  Treatment Team:  Lloyd Huger, MD Leonel Ramsay, MD Noreene Filbert, MD Alexis Goodell, MD  DRUG ALLERGIES:   Allergies  Allergen Reactions  . Levaquin [Levofloxacin] Other (See Comments)    confusion    DISCHARGE MEDICATIONS:   Current Discharge Medication List    START taking these medications   Details  Morphine Sulfate (MORPHINE  CONCENTRATE) 10 MG/0.5ML SOLN concentrated solution Take 0.25 mLs (5 mg total) by mouth every hour as needed for moderate pain, severe pain or shortness of  breath. Qty: 180 mL, Refills: 0      CONTINUE these medications which have CHANGED   Details  morphine (MS CONTIN) 15 MG 12 hr tablet Take 1 tablet (15 mg total) by mouth every 12 (twelve) hours. Qty: 30 tablet, Refills: 0      CONTINUE these medications which have NOT CHANGED   Details  famotidine (PEPCID) 20 MG tablet Take 1 tablet (20 mg total) by mouth 2 (two) times daily. Qty: 60 tablet, Refills: 0      STOP taking these medications     ibuprofen (ADVIL,MOTRIN) 200 MG tablet      metoprolol tartrate (LOPRESSOR) 25 MG tablet      polyethylene glycol (MIRALAX / GLYCOLAX) packet      levofloxacin (LEVAQUIN) 750 MG tablet      oxyCODONE (OXY IR/ROXICODONE) 5 MG immediate release tablet          DISCHARGE INSTRUCTIONS:    Follow as needed, for comfort care.  If you experience worsening of your admission symptoms, develop shortness of breath, life threatening emergency, suicidal or homicidal thoughts you must seek medical attention immediately by calling 911 or calling your MD immediately  if symptoms less severe.  You Must read complete instructions/literature along with all the possible adverse reactions/side effects for all the Medicines you take and that have been prescribed to you. Take any new Medicines after you have completely understood and accept all the possible adverse reactions/side effects.   Please note  You were cared for by a hospitalist during your hospital stay. If you have any questions about your discharge medications or the care you received while you were in the hospital after you are discharged, you can call the unit and asked to speak with the hospitalist on call if the hospitalist that took care of you is not available. Once you are discharged, your primary care physician will handle any further medical issues. Please note that NO REFILLS for any discharge medications will be authorized once you are discharged, as it is imperative that you return to  your primary care physician (or establish a relationship with a primary care physician if you do not have one) for your aftercare needs so that they can reassess your need for medications and monitor your lab values.    Today   CHIEF COMPLAINT:   Chief Complaint  Patient presents with  . Emesis  . Diarrhea  . Nausea    HISTORY OF PRESENT ILLNESS:  Dennis Perkins  is a 60 y.o. male with a known history of COPD, essential hypertension, metastatic lung cancer recently diagnosed who presents to the hospital due to intractable nausea vomiting and pain on the right side of his chest. 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 He was recently discharged on MS Contin and oxycodone as needed for pain but has been having significant nausea and vomiting and has not been able to keep those medications down. He presents to the emergency room today and was noted to be severely hypokalemic with a potassium of 2.6. He is also complaining of significant pain in the right side of his chest from his malignancy about a 10 intensity. Given his electrolyte abnormalities and persistent pain hospitalist services were contacted further treatment and evaluation. Patient admits to about  a 40 pound weight loss in the past 3-6 months. He admits to some night sweats, chills but no documented fever. He denies any abdominal pain but does admit to persistent nausea and vomiting which is bilious and nonbloody in nature.    VITAL SIGNS:  Blood pressure 140/75, pulse 87, temperature 98.7 F (37.1 C), temperature source Oral, resp. rate 18, height '5\' 9"'$  (1.753 m), weight 64.728 kg (142 lb 11.2 oz), SpO2 95 %.  I/O:   Intake/Output Summary (Last 24 hours) at 04/26/16 1546 Last data filed at 04/26/16 0736  Gross per 24 hour  Intake      0 ml  Output      0 ml  Net      0 ml    PHYSICAL EXAMINATION:   GENERAL: 60 y.o.-year-old patient  lying in the bed with no acute distress.  EYES: Pupils equal, round, reactive to light and accommodation. No scleral icterus. Extraocular muscles intact.  HEENT: Head atraumatic, normocephalic. Oropharynx and nasopharynx clear.  NECK: Supple, no jugular venous distention. No thyroid enlargement, no tenderness.  LUNGS: Normal breath sounds bilaterally, no wheezing, rales,rhonchi or crepitation. No use of accessory muscles of respiration. Decreased right upper lobe breath sounds. CARDIOVASCULAR: S1, S2 normal. No murmurs, rubs, or gallops.  ABDOMEN: Soft, nontender, nondistended. Bowel sounds present. No organomegaly or mass.  EXTREMITIES: No pedal edema, cyanosis, or clubbing. RUL swelling. NEUROLOGIC: alert and communicating, but confused. Not Following commands, moves limbs spontaneously.  PSYCHIATRIC: The patient is alert, disoriented. SKIN: No obvious rash, lesion, or ulcer.   DATA REVIEW:   CBC  Recent Labs Lab 04/25/16 0450  WBC 28.4*  HGB 10.3*  HCT 31.4*  PLT 302    Chemistries   Recent Labs Lab 04/22/16 0444  04/25/16 0450  NA 137  < > 139  K 3.3*  < > 3.2*  CL 102  < > 101  CO2 27  < > 27  GLUCOSE 96  < > 86  BUN 9  < > 14  CREATININE 0.52*  < > 0.61  CALCIUM 7.6*  < > 8.0*  MG 1.8  --   --   AST  --   --  109*  ALT  --   --  29  ALKPHOS  --   --  259*  BILITOT  --   --  0.6  < > = values in this interval not displayed.  Cardiac Enzymes No results for input(s): TROPONINI in the last 168 hours.  Microbiology Results  Results for orders placed or performed during the hospital encounter of 04/14/16  CULTURE, BLOOD (ROUTINE X 2) w Reflex to ID Panel     Status: None   Collection Time: 04/16/16  5:49 AM  Result Value Ref Range Status   Specimen Description BLOOD RIGHT HAND  Final   Special Requests BOTTLES DRAWN AEROBIC AND ANAEROBIC 8ML  Final   Culture NO GROWTH 5 DAYS  Final   Report Status 04/21/2016 FINAL  Final  CULTURE, BLOOD (ROUTINE X 2)  w Reflex to ID Panel     Status: None   Collection Time: 04/16/16  5:53 AM  Result Value Ref Range Status   Specimen Description BLOOD RIGHT HAND  Final   Special Requests   Final    BOTTLES DRAWN AEROBIC AND ANAEROBIC AER 8ML ANA 10ML   Culture NO GROWTH 5 DAYS  Final   Report Status 04/21/2016 FINAL  Final  CULTURE, BLOOD (ROUTINE X 2) w Reflex to  ID Panel     Status: None (Preliminary result)   Collection Time: 04/24/16  1:29 PM  Result Value Ref Range Status   Specimen Description BLOOD LEFT ASSIST CONTROL  Final   Special Requests BOTTLES DRAWN AEROBIC AND ANAEROBIC  3CC  Final   Culture NO GROWTH 2 DAYS  Final   Report Status PENDING  Incomplete  CULTURE, BLOOD (ROUTINE X 2) w Reflex to ID Panel     Status: None (Preliminary result)   Collection Time: 04/24/16  1:45 PM  Result Value Ref Range Status   Specimen Description BLOOD LEFT ARM  Final   Special Requests   Final    BOTTLES DRAWN AEROBIC AND ANAEROBIC  AEROBIC 5CC, ANAEROBIC 3CC   Culture NO GROWTH 2 DAYS  Final   Report Status PENDING  Incomplete    RADIOLOGY:  No results found.  EKG:   Orders placed or performed during the hospital encounter of 04/14/16  . EKG 12-Lead  . EKG 12-Lead      Management plans discussed with the patient, family and they are in agreement.  CODE STATUS:     Code Status Orders        Start     Ordered   04/19/16 8295  Do not attempt resuscitation (DNR)   Continuous    Question Answer Comment  In the event of cardiac or respiratory ARREST Do not call a "code blue"   In the event of cardiac or respiratory ARREST Do not perform Intubation, CPR, defibrillation or ACLS   In the event of cardiac or respiratory ARREST Use medication by any route, position, wound care, and other measures to relive pain and suffering. May use oxygen, suction and manual treatment of airway obstruction as needed for comfort.   Comments DNI and no dialysis or feeding tubes.      04/19/16 1358    Code  Status History    Date Active Date Inactive Code Status Order ID Comments User Context   04/14/2016  9:10 PM 04/19/2016  1:58 PM Full Code [id number]  Henreitta Leber, MD Inpatient   03/24/2016  3:43 AM 03/27/2016  4:30 PM Full Code [id number]  Quintella Baton, MD Inpatient      TOTAL TIME TAKING CARE OF THIS PATIENT: 35 minutes.  I tried calling his sister- she did not pick up phone this morning, and left a msg on his daughter's phone to call back.  Later Family called to nursing station, and as per hospice nurse Santiago Glad- she spoke to sister and daughter both- and they agreed to transfer the pt to hospice home. will discharge today.  Vaughan Basta M.D on 04/26/2016 at 3:46 PM  Between 7am to 6pm - Pager - [phone]  After 6pm go to [email or web address] - password [email or web address] [facility]  Office  [phone]  CC: Primary care physician; Default, Provider, MD   Note: This dictation was prepared with Dragon dictation along with smaller phrase technology. Any transcriptional errors that result from this process are unintentional.

## 2016-04-26 NOTE — Progress Notes (Signed)
Urine slightly blood tinged MD aware

## 2016-04-26 NOTE — Progress Notes (Signed)
New referral for [facility] services received from [facility] on 6/29. Writer spoke with  Palliative NP Wadie Lessen yesterday. Mary had planned to meet with patient's sister Alvester Chou, but she did not come to the hospital yesterday. The plan was for patient to discharge home today with hospice services. Writer spoke with both attending physician Dr. Marthann Schiller and [facility] regarding discharge plan. If patient goes home he will need oxygen and a hospital bed in place prior to discharge. He has continued to need PRN pain medication both IV dilaudid and liquid morphine. He is more l;ethargic today and is not eating. Attending physician Dr. Eleonore Chiquito has attempted to contact both patient's sister and his daughter at the number provided and left messages for them to contact the hospital regarding disposition. Patient information faxed to hospice referral. Awaiting confirmation of disposition. Thank you. Flo Shanks RN, BSN, [place] [facility] [place], [facility] [phone] c

## 2016-04-27 LAB — VITAMIN B1: VITAMIN B1 (THIAMINE): 310.5 nmol/L — AB (ref 66.5–200.0)

## 2016-04-29 ENCOUNTER — Ambulatory Visit: Payer: Medicaid Other

## 2016-04-29 ENCOUNTER — Ambulatory Visit: Admission: RE | Admit: 2016-04-29 | Payer: Medicaid Other | Source: Ambulatory Visit

## 2016-04-29 LAB — CULTURE, BLOOD (ROUTINE X 2)
CULTURE: NO GROWTH
CULTURE: NO GROWTH

## 2016-05-01 ENCOUNTER — Ambulatory Visit: Payer: Medicaid Other

## 2016-05-02 ENCOUNTER — Inpatient Hospital Stay: Payer: Medicaid Other | Admitting: Oncology

## 2016-05-02 ENCOUNTER — Ambulatory Visit: Payer: Medicaid Other

## 2016-05-03 ENCOUNTER — Ambulatory Visit: Payer: Medicaid Other

## 2016-05-06 ENCOUNTER — Ambulatory Visit: Payer: Medicaid Other

## 2016-05-07 ENCOUNTER — Ambulatory Visit: Payer: Medicaid Other

## 2016-05-08 ENCOUNTER — Ambulatory Visit: Payer: Medicaid Other

## 2016-05-09 ENCOUNTER — Ambulatory Visit: Payer: Medicaid Other

## 2016-05-10 ENCOUNTER — Ambulatory Visit: Payer: Medicaid Other

## 2016-05-13 ENCOUNTER — Ambulatory Visit: Payer: Medicaid Other

## 2016-05-14 ENCOUNTER — Ambulatory Visit: Payer: Medicaid Other

## 2016-05-15 ENCOUNTER — Ambulatory Visit: Payer: Medicaid Other

## 2016-05-16 ENCOUNTER — Ambulatory Visit: Payer: Medicaid Other

## 2016-05-17 ENCOUNTER — Ambulatory Visit: Payer: Medicaid Other

## 2016-05-20 ENCOUNTER — Ambulatory Visit: Payer: Medicaid Other

## 2016-05-21 ENCOUNTER — Ambulatory Visit: Payer: Medicaid Other

## 2016-05-22 ENCOUNTER — Ambulatory Visit: Payer: Medicaid Other

## 2016-05-23 ENCOUNTER — Ambulatory Visit: Payer: Medicaid Other

## 2016-05-24 ENCOUNTER — Ambulatory Visit: Payer: Medicaid Other

## 2016-05-27 ENCOUNTER — Ambulatory Visit: Payer: Medicaid Other

## 2016-05-28 ENCOUNTER — Ambulatory Visit: Payer: Medicaid Other

## 2016-05-28 DEATH — deceased

## 2017-01-01 IMAGING — CT CT CHEST W/ CM
2 of 5 series · 11 of 36 positions shown, 13 images · IV contrast (iopamidol)
Comparison: Chest radiograph 04/14/2016, chest abdomen pelvis CT
03/23/2016

CLINICAL DATA: History of lung cancer.

EXAM:
CT CHEST, ABDOMEN, AND PELVIS WITH CONTRAST
TECHNIQUE: Multidetector CT imaging of the chest, abdomen and pelvis was
performed following the standard protocol during bolus
administration of intravenous contrast.
CONTRAST:  100mL OFFEZU-FMM IOPAMIDOL (OFFEZU-FMM) INJECTION 61%

[Series 2: cap with · axial · 0.68mm/px · z∈[-1034,-494]mm · 8 of 133 slices shown, 10 images]
[im 13/133  mediastinal]
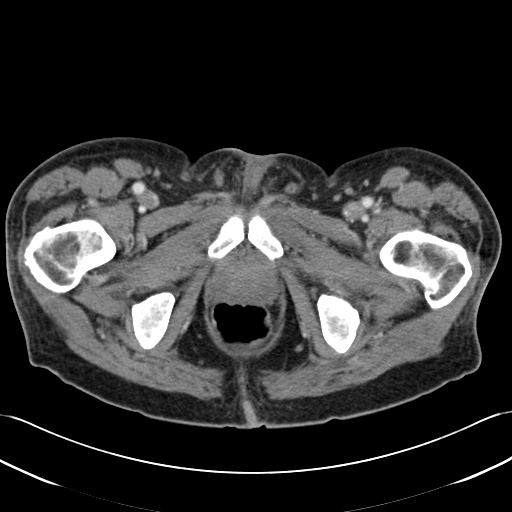
[im 13/133  lung]
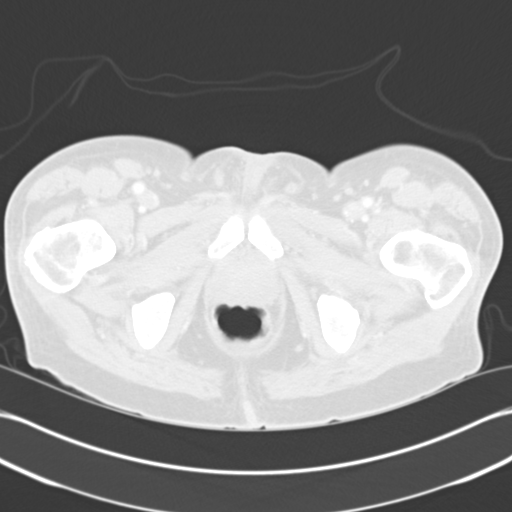
[im 25/133  lung]
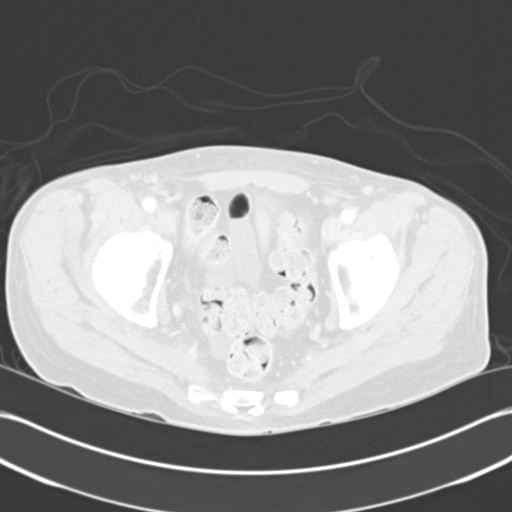
[im 49/133  lung]
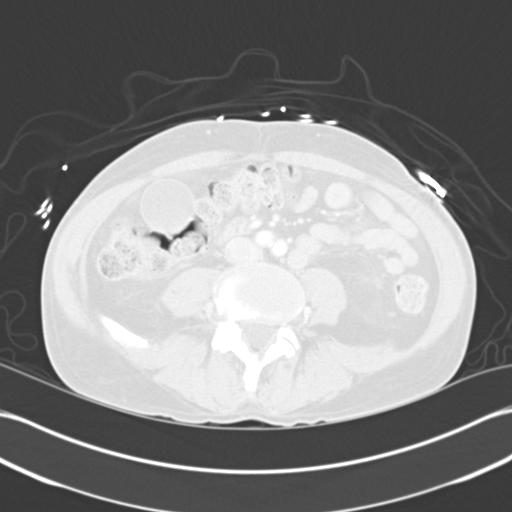
[im 61/133  lung]
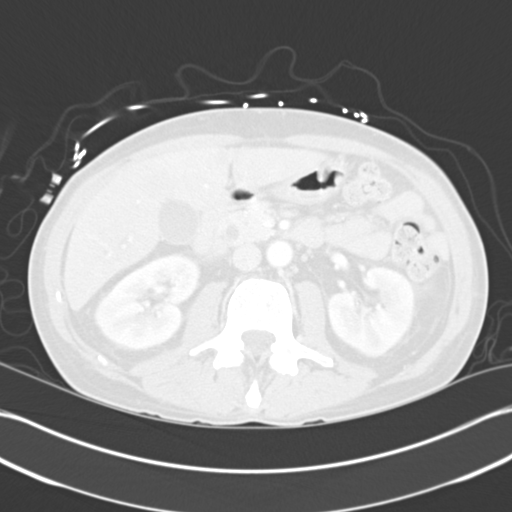
[im 73/133  mediastinal]
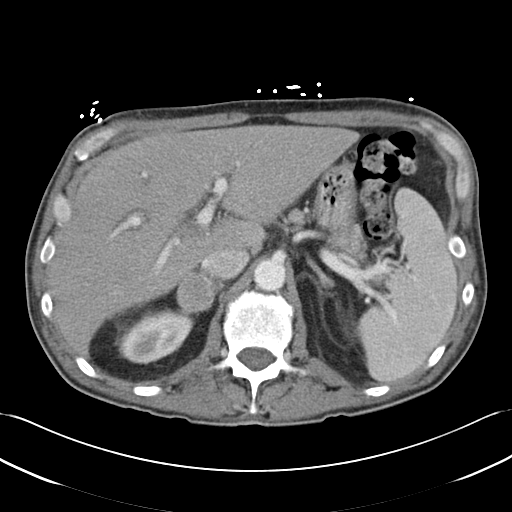
[im 73/133  lung]
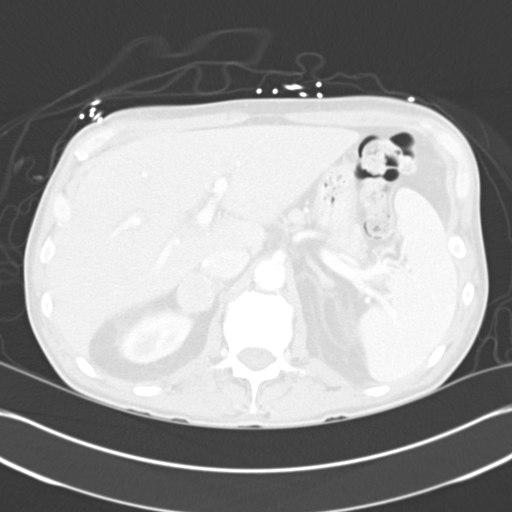
[im 85/133  lung]
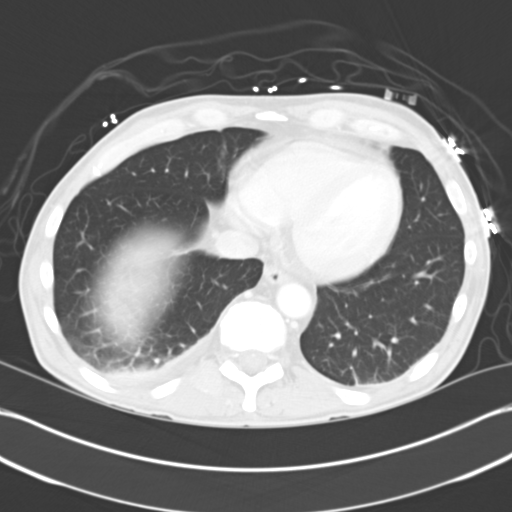
[im 109/133  lung]
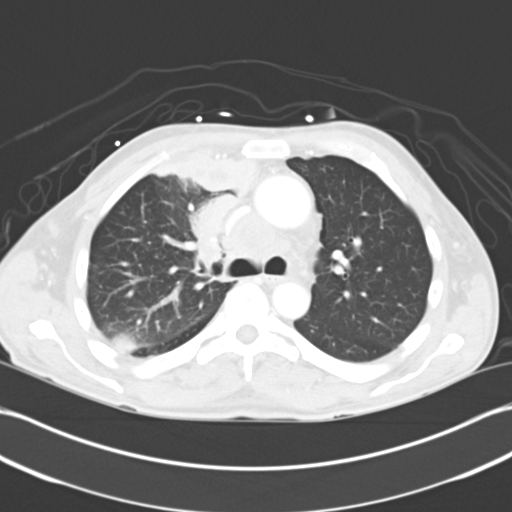
[im 121/133  lung]
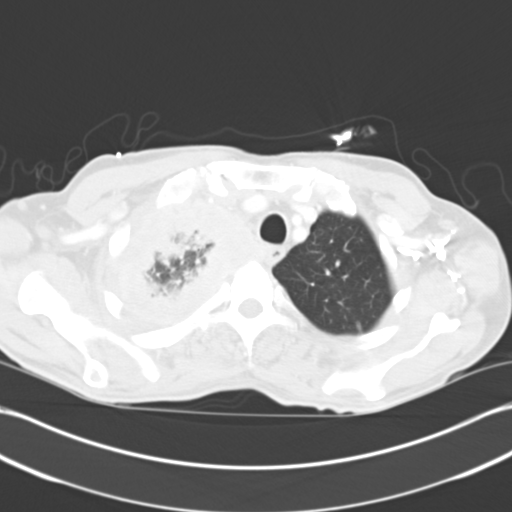

[Series 5: cor cap with · coronal · 0.65mm/px · 3 of 112 slices shown]
[im 23/112  lung]
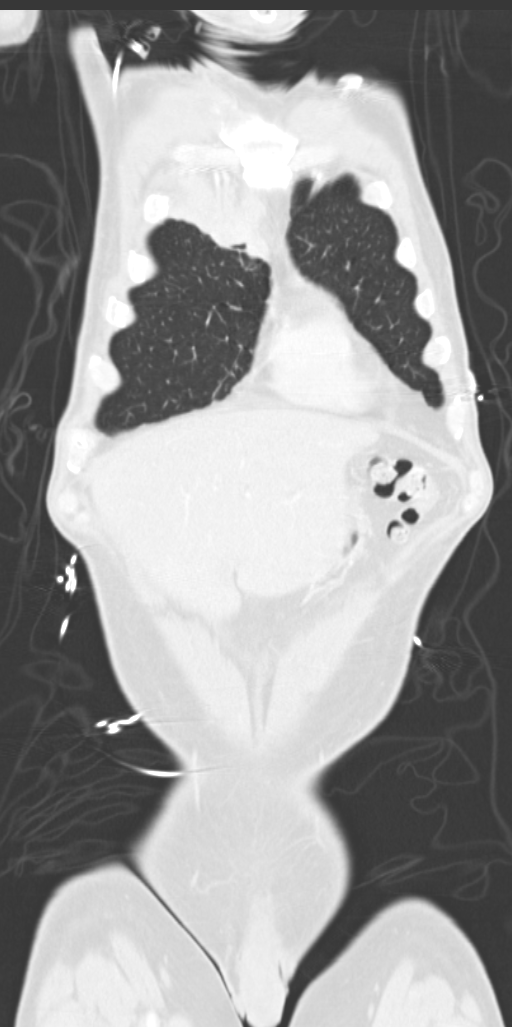
[im 45/112  lung]
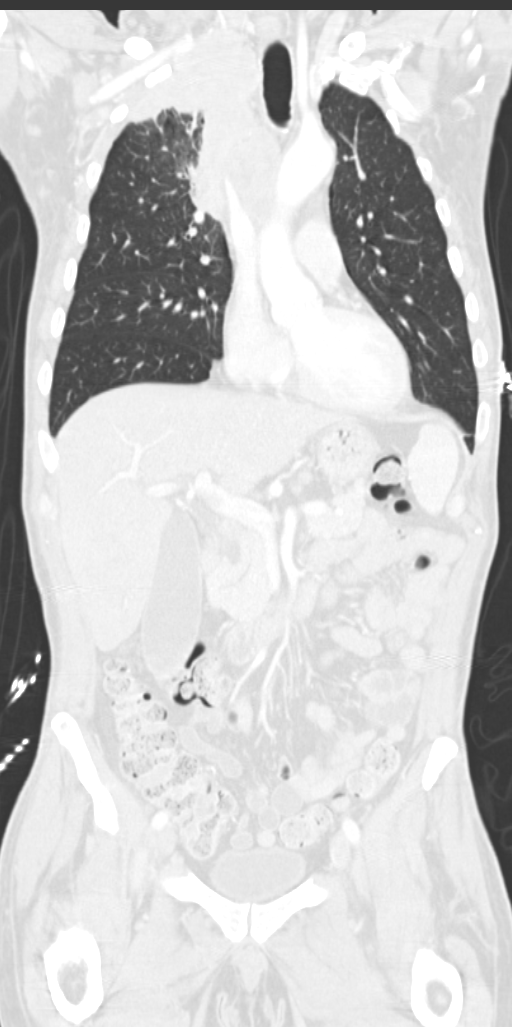
[im 67/112  lung]
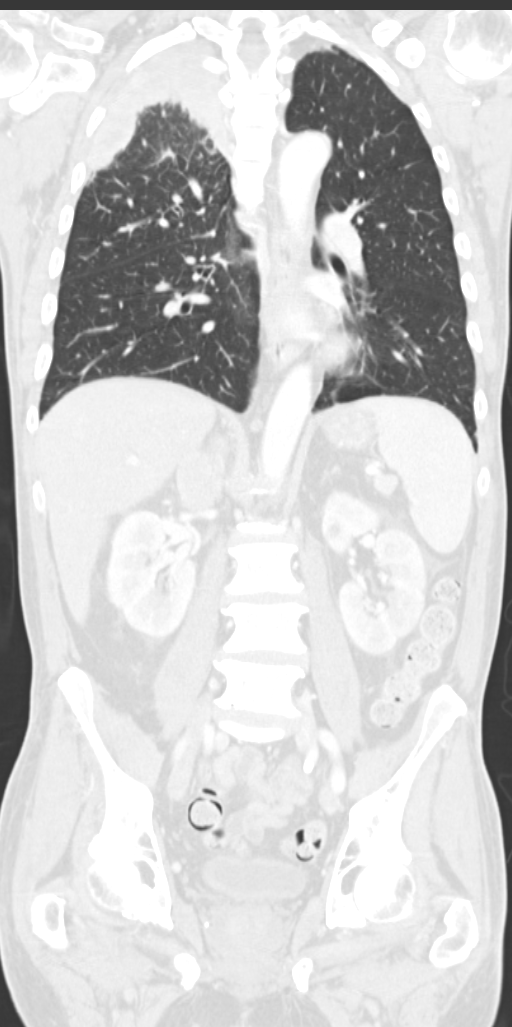

[11 of 36 positions shown; findings below may reference images not displayed]

FINDINGS: CT CHEST FINDINGS

Mediastinum/Lymph Nodes: There is a right-sided mediastinal
lymphadenopathy with necrotic appearance. Precarinal abnormal
appearing lymph node measures 2.9 cm in short axis. There is also
similar in appearance right hilar lymphadenopathy. The heart is
normal in size. There is no significant pericardial effusion. Mild
calcific atherosclerotic disease of the coronary arteries noted.
Small nodules are seen along the medial right upper and middle lobe
visceral pleura, likely representing seeding with metastatic
disease. No evidence of central pulmonary embolus.

Lungs/Pleura: There is a nodular soft tissue thickening of the upper
lobe pleura, consistent with neoplastic involvement. Satellite
nodules are seen along the medial fissural pleura of the right
middle lobe. There is a 7 mm soft tissue nodule in the medial
portion of the right upper lobe. More linear pleural thickening is
seen within the posterior right lower lobe pleura. There is a small
right pleural effusion. The left lung is clear other than mild
dependent basilar atelectasis. No evidence of pneumothorax.

Musculoskeletal: Metastatic deposit with large soft tissue component
is seen associated with the T11 vertebral body causing osteal lysis
of the small portion of the right hand side vertebral body, right
pedicle and right transverse process. Similar in appearance
partially visualized due to collimation metastatic deposit is seen
involving the posterior process of T1 vertebral body

CT ABDOMEN PELVIS FINDINGS

Hepatobiliary: There are 3 ill-defined liver masses likely
representing metastatic disease, which are too vague is to be
measured accurately. Periportal edema. There is a 2.7 by 4.2 cm soft
tissue mass within porta hepaticus, likely representing enlarged
lymph node. Other smaller adjacent lymph nodes are seen. Borderline
enlarged but rounded lymph nodes are noted within the gastrohepatic
ligament, probably metastatic.

Pancreas: No mass, inflammatory changes, or other significant
abnormality.

Spleen: Within normal limits in size and appearance.

Adrenals/Urinary Tract: There is a right adrenal soft tissue mass
measuring 3.3 by 2.8 cm, probably metastatic. The left adrenal is
normal. There is a left superior water density measuring
circumscribed lesion, representing a cyst by CT criteria. Another
less defined hypoattenuated lesion is seen in the medial upper
cortex of the left kidney.

Stomach/Bowel: No evidence of obstruction, inflammatory process, or
abnormal fluid collections.

Vascular Normal appearance of the abdominal aorta.

Reproductive: No mass or other significant abnormality.

Other: None.

Musculoskeletal: No suspicious bone lesions identified within the
abdomen or pelvis.
IMPRESSION: Necrotic appearing right-sided anterior mediastinal and right hilar
lymphadenopathy.

Extensive nodular soft tissue thickening of the right upper lobe
pleura, highly suggestive for metastatic disease with dropped
nodules along the medial pleura of the right middle lobe. The
previously demonstrated right upper lobe pulmonary based mass has
coalesced with the metastatic pleural thickening. Small right upper
lobe 7 mm soft tissue nodule, likely neoplastic.

Osseous metastatic disease with significant soft tissue component
involving the right hand side of T11 vertebral body, and the
posterior process of T1 vertebral body.

Three large vaguely defined liver masses, which likely represent
metastatic disease, difficult to measure due to extremely indistinct
borders.

Right adrenal mass, porta hepatis and mesenteric lymphadenopathy,
highly suggestive of metastatic disease, and increased in size.

Indeterminate left renal lesion, which may represent a cyst.

Periportal edema within the liver.

## 2017-01-04 IMAGING — CT CT HEAD W/O CM
3 series · 15 of 47 positions shown, 18 images · non-contrast
Comparison: Brain MRI 03/24/2016

CLINICAL DATA: Confusion, history of lung cancer

EXAM:
CT HEAD WITHOUT CONTRAST
TECHNIQUE: Contiguous axial images were obtained from the base of the skull
through the vertex without intravenous contrast.

[Series 2: head wo · axial · 0.40mm/px · z∈[+669,+799]mm · 9 of 32 slices shown, 12 images]
[im 3/32  brain]
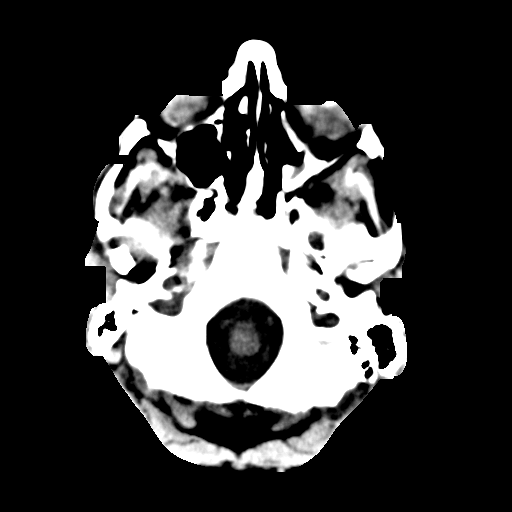
[im 3/32  bone]
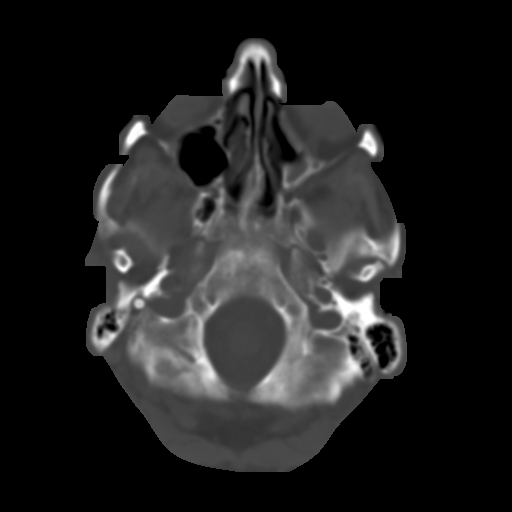
[im 6/32  brain]
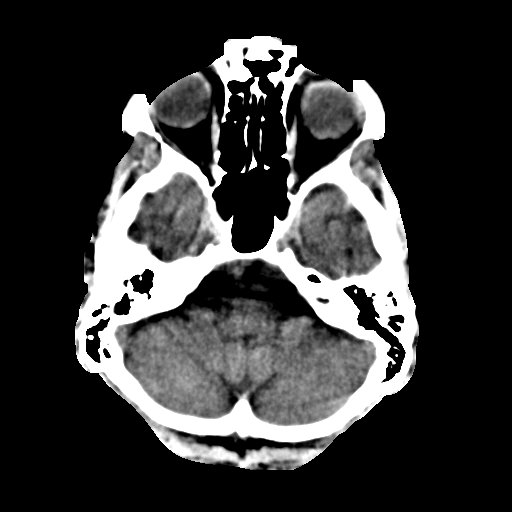
[im 9/32  brain]
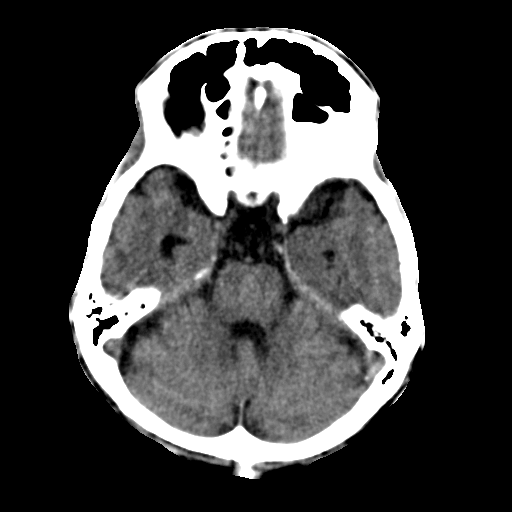
[im 12/32  brain]
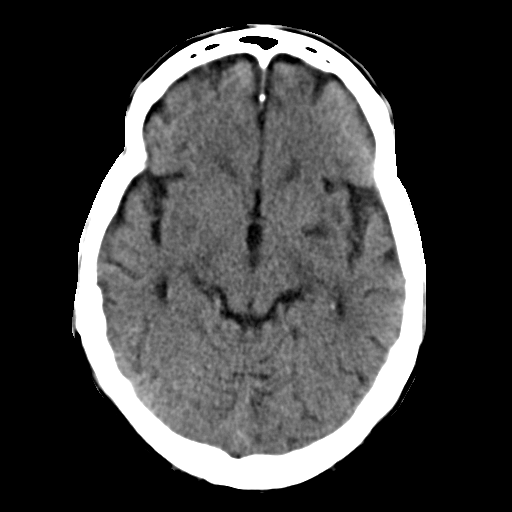
[im 17/32  brain]
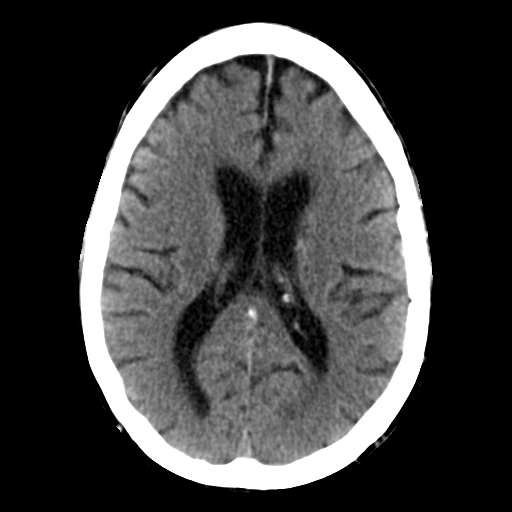
[im 17/32  bone]
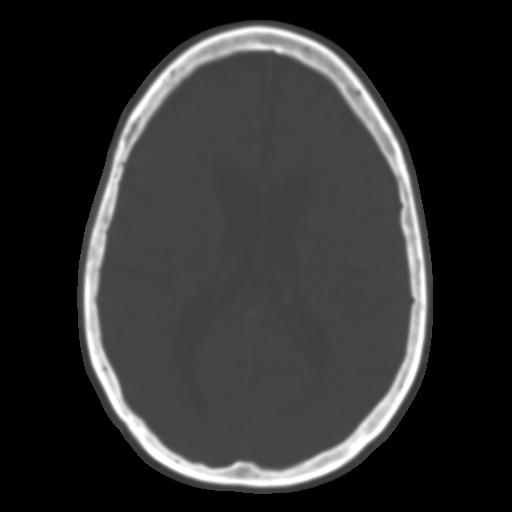
[im 20/32  brain]
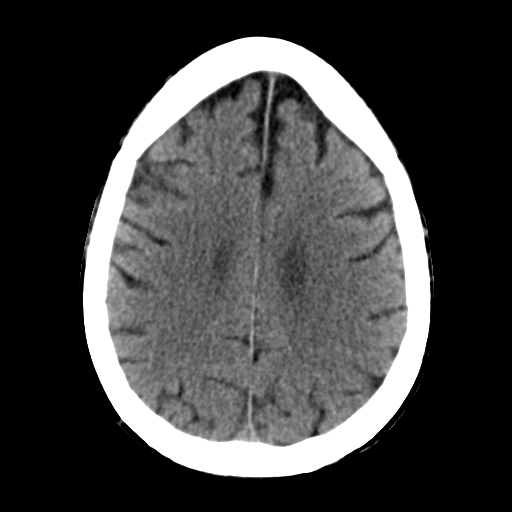
[im 23/32  brain]
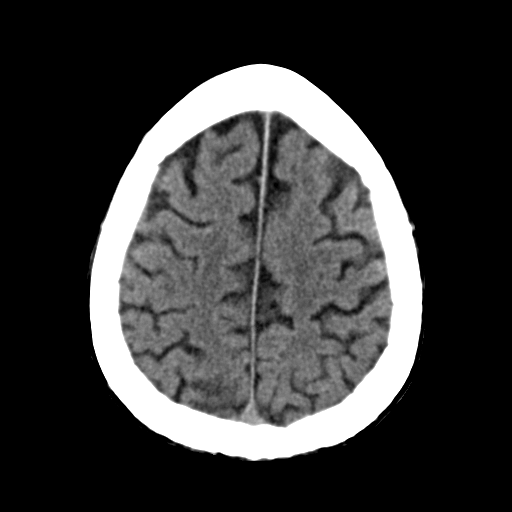
[im 26/32  brain]
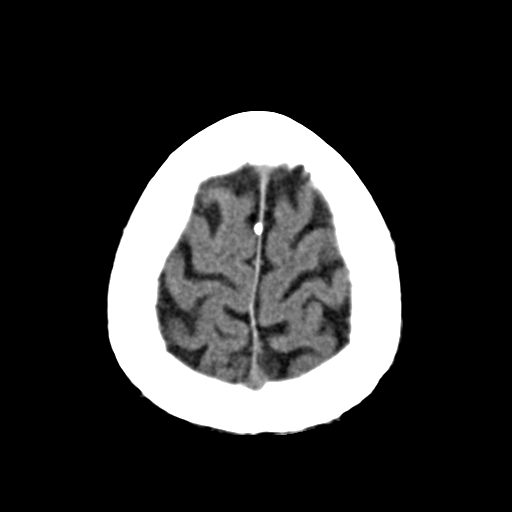
[im 29/32  brain]
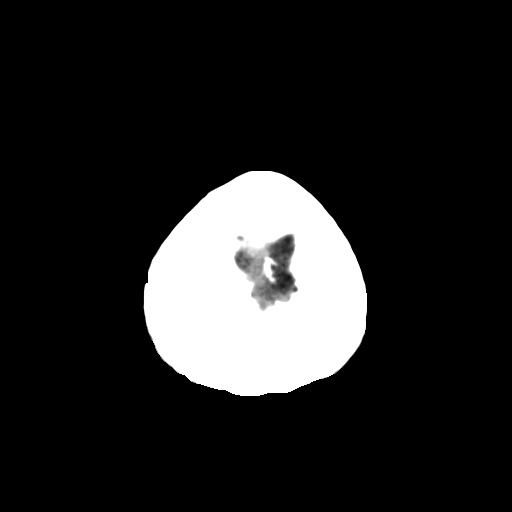
[im 29/32  bone]
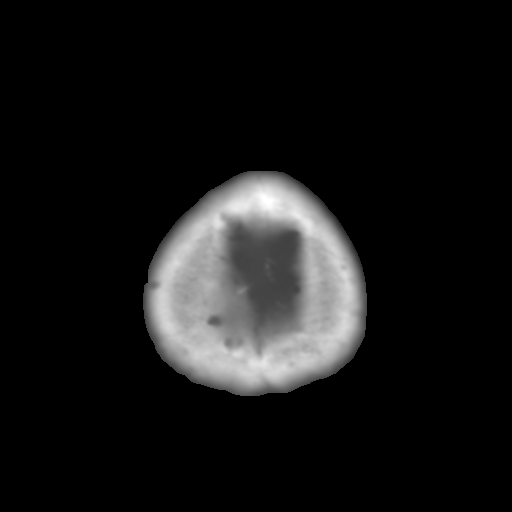

[Series 4: coronal soft · coronal · 0.31mm/px · 3 of 67 slices shown]
[im 23/67  brain]
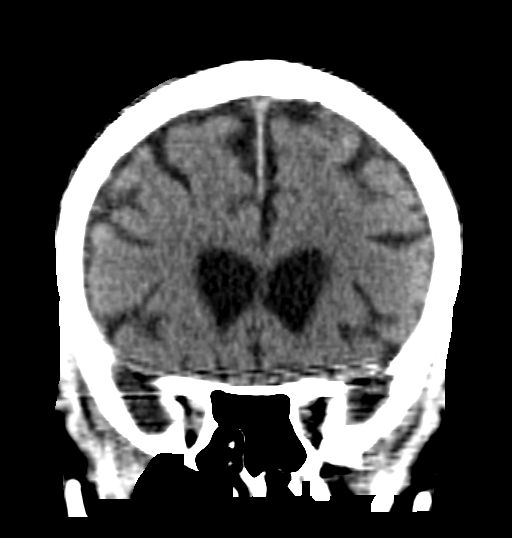
[im 30/67  brain]
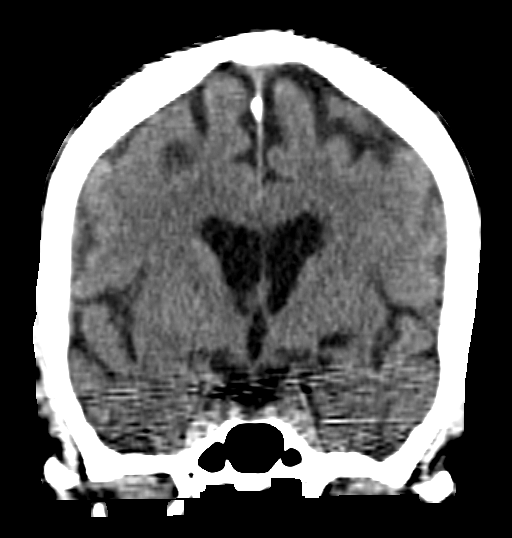
[im 37/67  brain]
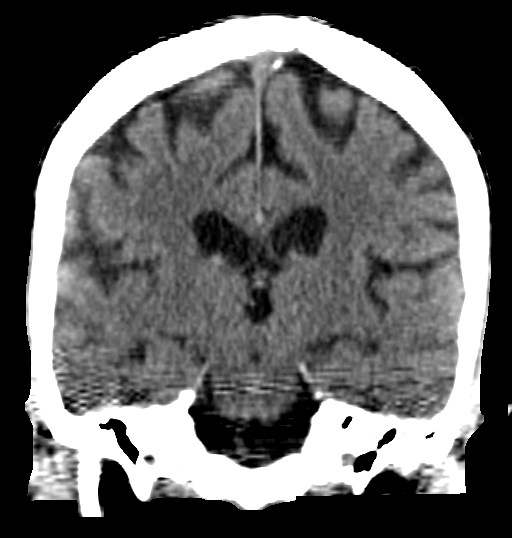

[Series 5: sagittal soft · sagittal · 0.33mm/px · 3 of 50 slices shown]
[im 17/50  brain]
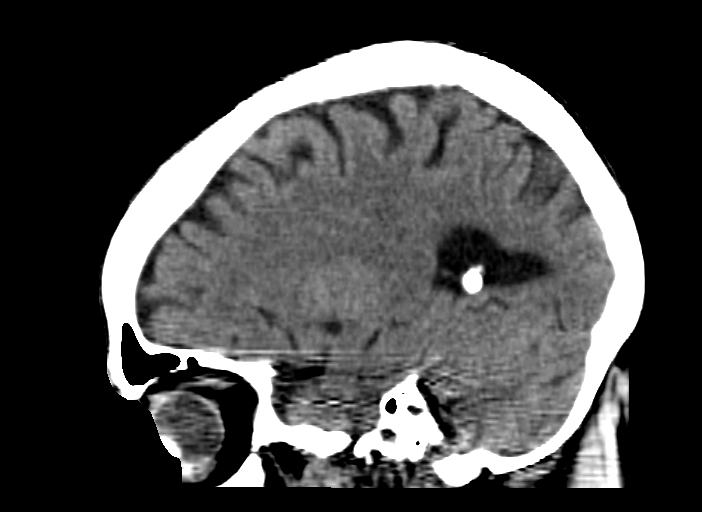
[im 25/50  brain]
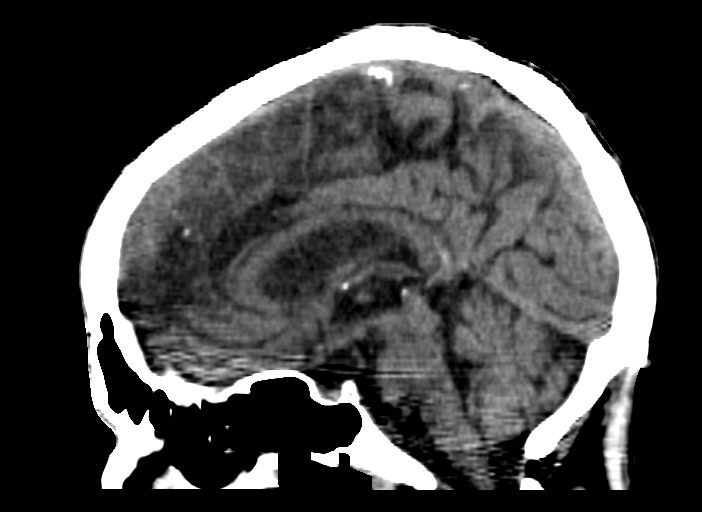
[im 33/50  brain]
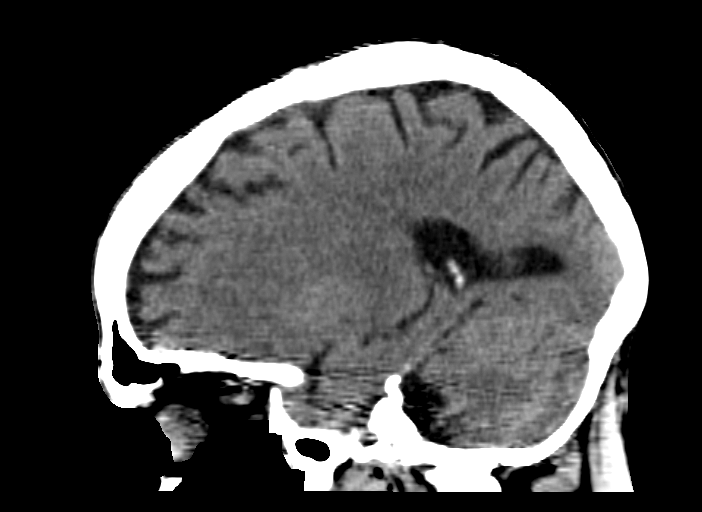

[15 of 47 positions shown; findings below may reference images not displayed]

FINDINGS: No skull fracture is noted. Paranasal sinuses shows mild mucosal
thickening left maxillary sinus.

No intracranial hemorrhage, mass effect or midline shift.

No acute cortical infarction. No mass lesion is noted on this
unenhanced scan.

No intraventricular hemorrhage.

No vascular calcifications are noted.
IMPRESSION: No acute intracranial abnormality. There is mucosal thickening left
maxillary sinus. No definite acute cortical infarction.

## 2018-03-28 IMAGING — US US EXTREM LOW VENOUS BILAT
1 series · 13 of 24 positions shown · non-contrast
Comparison: None.

CLINICAL DATA: Bilateral lower extremity swelling with fever for 2
days.



[Series 1: us extrem low venous bilat · 0.06mm/px · 13 of 58 slices shown]
[im 1/58]
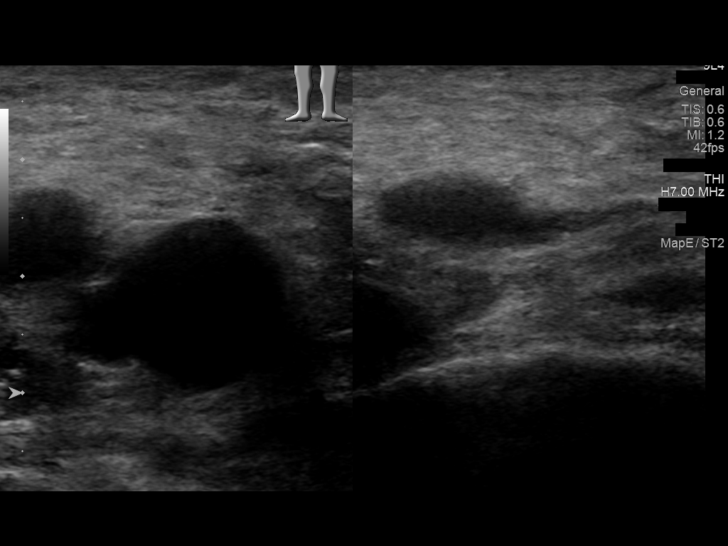
[im 5/58]
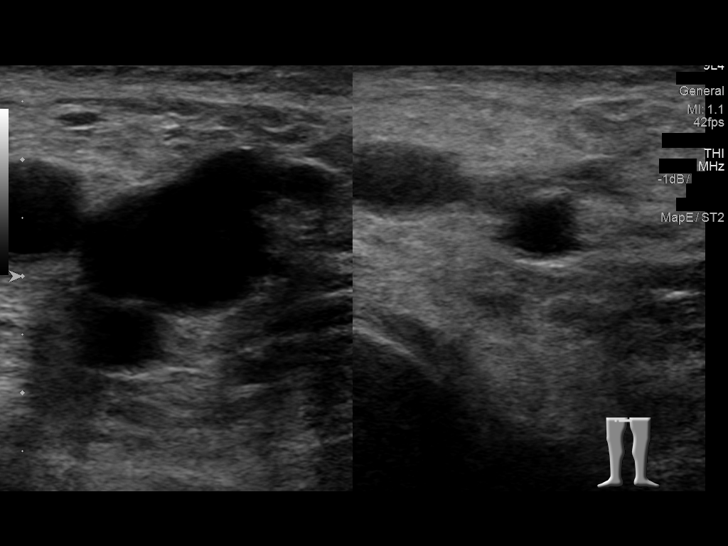
[im 10/58]
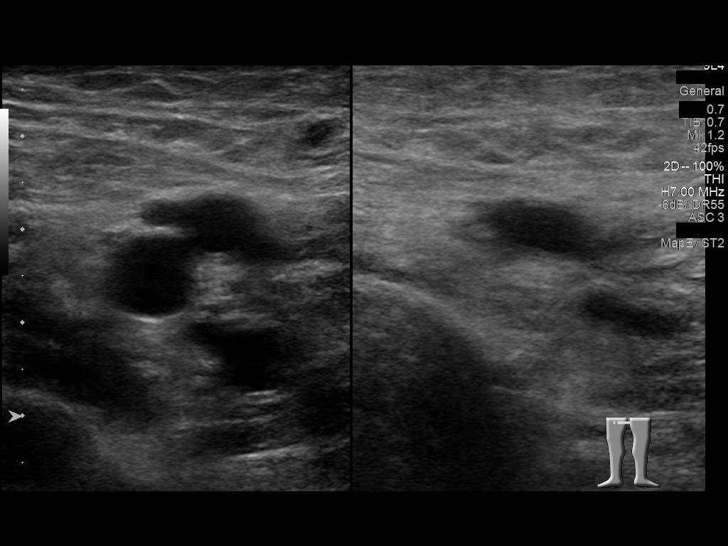
[im 15/58]
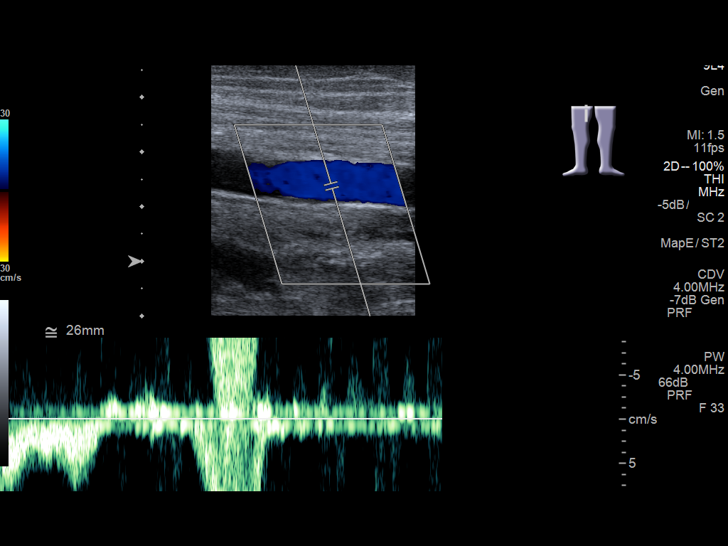
[im 20/58]
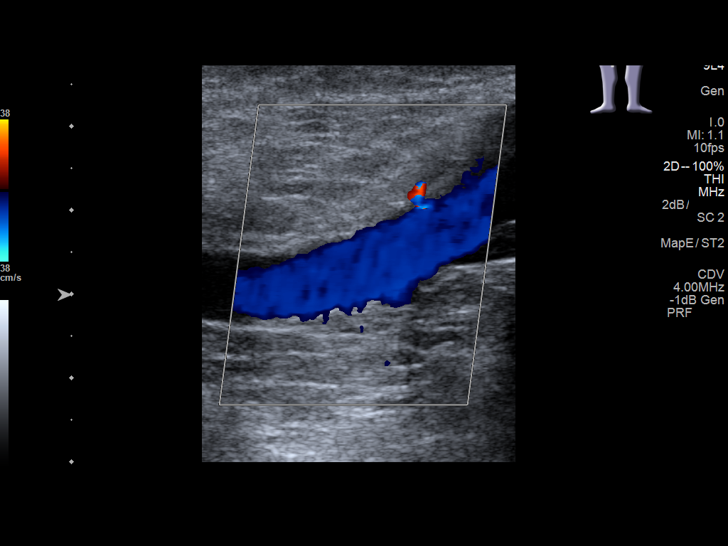
[im 25/58]
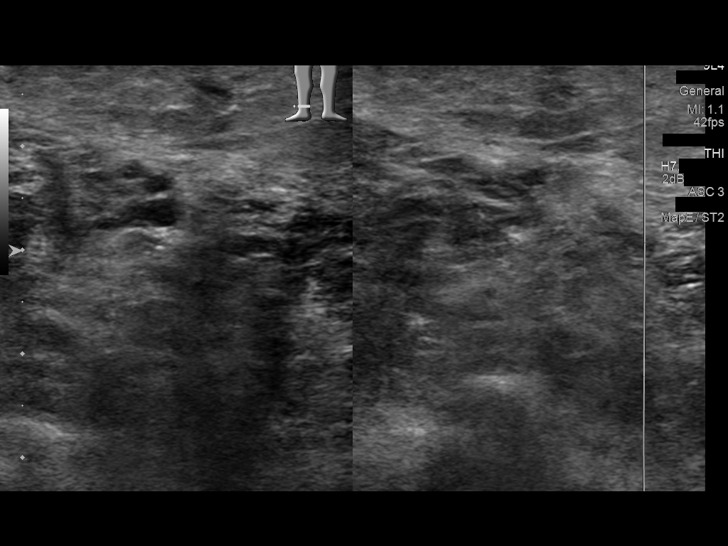
[im 30/58]
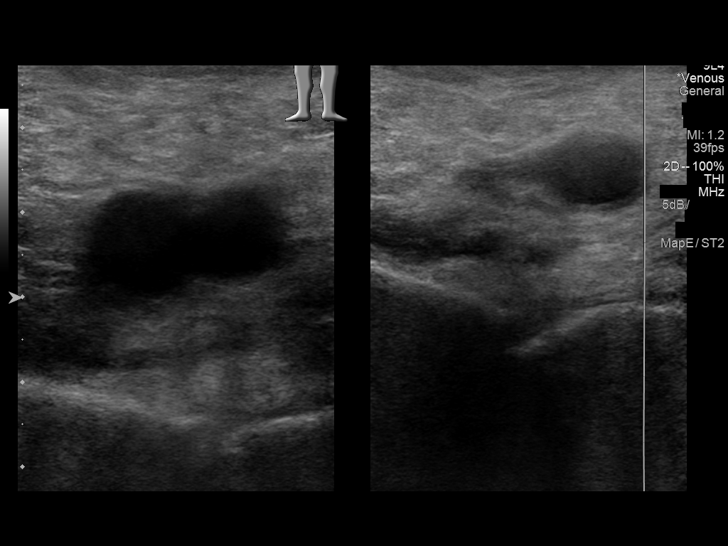
[im 33/58]
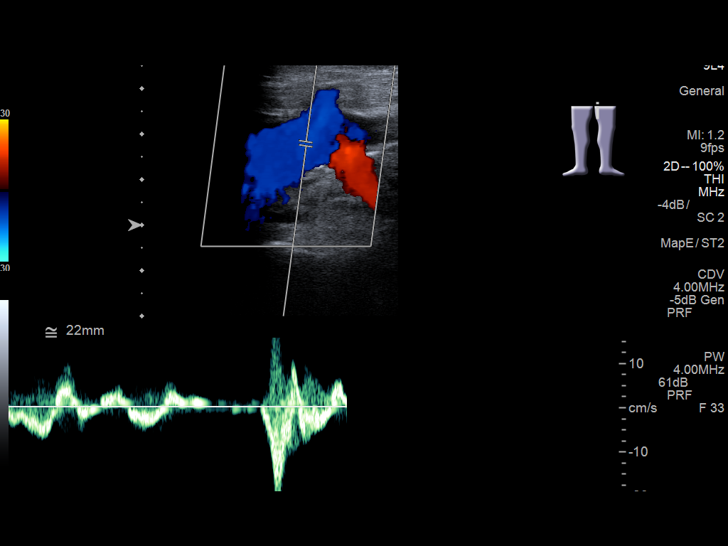
[im 38/58]
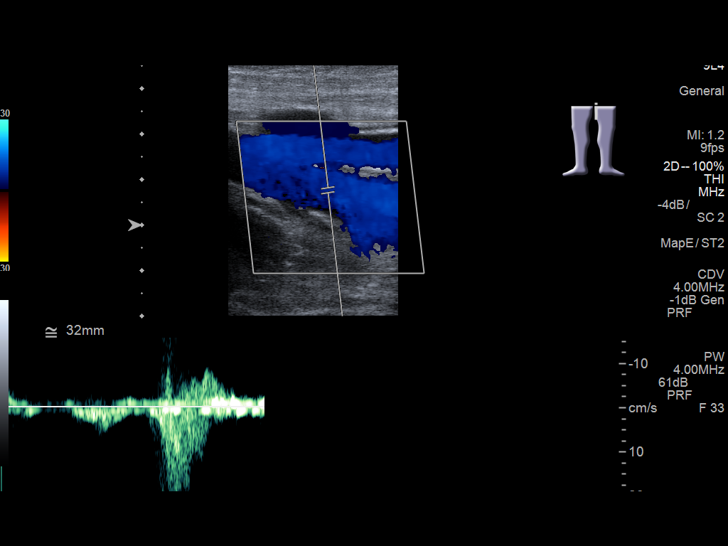
[im 43/58]
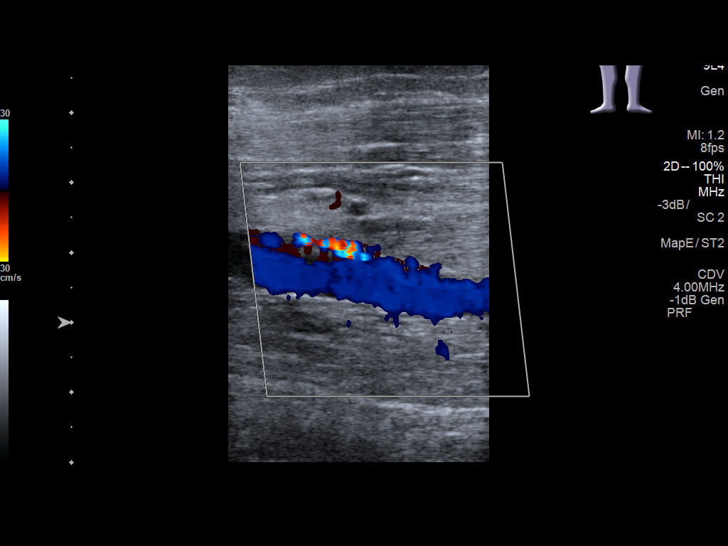
[im 48/58]
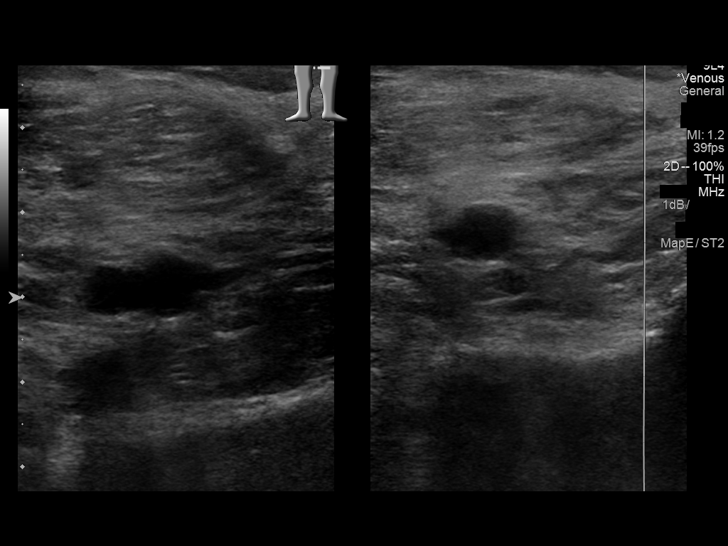
[im 53/58]
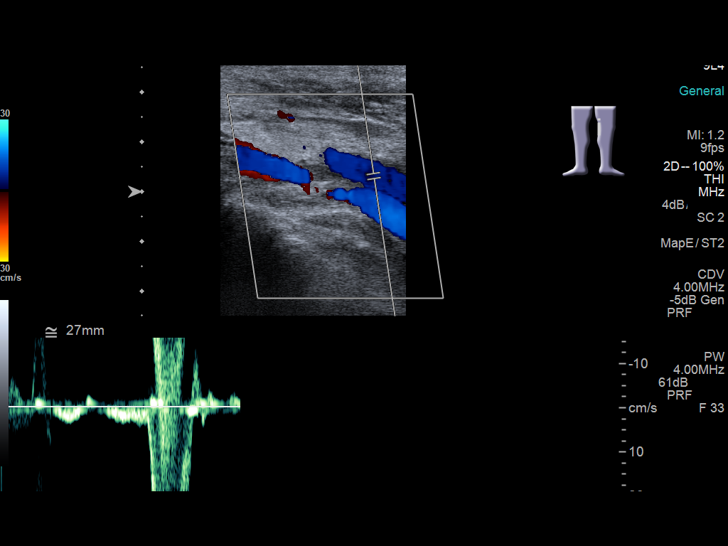
[im 58/58]
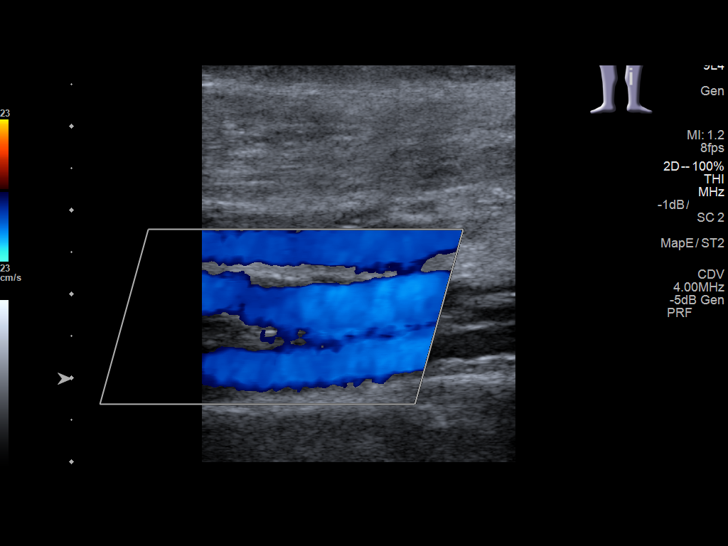

[13 of 24 positions shown; findings below may reference images not displayed]

FINDINGS: RIGHT LOWER EXTREMITY

Common Femoral Vein: No evidence of thrombus. Normal
compressibility, respiratory phasicity and response to augmentation.

Saphenofemoral Junction: No evidence of thrombus. Normal
compressibility and flow on color Doppler imaging.

Profunda Femoral Vein: No evidence of thrombus. Normal
compressibility and flow on color Doppler imaging.

Femoral Vein: No evidence of thrombus. Normal compressibility,
respiratory phasicity and response to augmentation.

Popliteal Vein: No evidence of thrombus. Normal compressibility,
respiratory phasicity and response to augmentation.

Calf Veins: No evidence of thrombus. Normal compressibility and flow
on color Doppler imaging.

Superficial Great Saphenous Vein: No evidence of thrombus. Normal
compressibility and flow on color Doppler imaging.

Venous Reflux:  None.

Other Findings:  None.

LEFT LOWER EXTREMITY

Common Femoral Vein: No evidence of thrombus. Normal
compressibility, respiratory phasicity and response to augmentation.

Saphenofemoral Junction: No evidence of thrombus. Normal
compressibility and flow on color Doppler imaging.

Profunda Femoral Vein: No evidence of thrombus. Normal
compressibility and flow on color Doppler imaging.

Femoral Vein: No evidence of thrombus. Normal compressibility,
respiratory phasicity and response to augmentation.

Popliteal Vein: No evidence of thrombus. Normal compressibility,
respiratory phasicity and response to augmentation.

Calf Veins: No evidence of thrombus. Normal compressibility and flow
on color Doppler imaging.

Superficial Great Saphenous Vein: No evidence of thrombus. Normal
compressibility and flow on color Doppler imaging.

Venous Reflux:  None.

Other Findings:  None.
IMPRESSION: No evidence of deep venous thrombosis.
